# Patient Record
Sex: Female | Born: 1945
Health system: Southern US, Community
[De-identification: ages and names within clinical notes are randomized; demographics above are authoritative.]

## PROBLEM LIST (undated history)

## (undated) DIAGNOSIS — I10 Essential (primary) hypertension: Secondary | ICD-10-CM

## (undated) DIAGNOSIS — E785 Hyperlipidemia, unspecified: Secondary | ICD-10-CM

## (undated) DIAGNOSIS — R51 Headache: Secondary | ICD-10-CM

## (undated) DIAGNOSIS — E039 Hypothyroidism, unspecified: Secondary | ICD-10-CM

## (undated) HISTORY — DX: Hyperlipidemia, unspecified: E78.5

## (undated) HISTORY — PX: BREAST SURGERY: SHX581

## (undated) HISTORY — PX: BREAST EXCISIONAL BIOPSY: SUR124

---

## 1999-09-11 ENCOUNTER — Other Ambulatory Visit: Admission: RE | Admit: 1999-09-11 | Discharge: 1999-09-11 | Payer: Self-pay | Admitting: *Deleted

## 2001-03-28 ENCOUNTER — Other Ambulatory Visit: Admission: RE | Admit: 2001-03-28 | Discharge: 2001-03-28 | Payer: Self-pay | Admitting: *Deleted

## 2002-10-23 ENCOUNTER — Other Ambulatory Visit: Admission: RE | Admit: 2002-10-23 | Discharge: 2002-10-23 | Payer: Self-pay

## 2004-02-01 ENCOUNTER — Other Ambulatory Visit: Admission: RE | Admit: 2004-02-01 | Discharge: 2004-02-01 | Payer: Self-pay | Admitting: Family Medicine

## 2004-05-12 ENCOUNTER — Encounter: Admission: RE | Admit: 2004-05-12 | Discharge: 2004-08-10 | Payer: Self-pay | Admitting: Family Medicine

## 2004-09-10 ENCOUNTER — Ambulatory Visit (HOSPITAL_COMMUNITY): Admission: RE | Admit: 2004-09-10 | Discharge: 2004-09-10 | Payer: Self-pay | Admitting: Gastroenterology

## 2004-10-02 ENCOUNTER — Encounter: Admission: RE | Admit: 2004-10-02 | Discharge: 2004-12-31 | Payer: Self-pay | Admitting: Family Medicine

## 2005-03-24 ENCOUNTER — Other Ambulatory Visit: Admission: RE | Admit: 2005-03-24 | Discharge: 2005-03-24 | Payer: Self-pay | Admitting: Family Medicine

## 2006-04-07 ENCOUNTER — Other Ambulatory Visit: Admission: RE | Admit: 2006-04-07 | Discharge: 2006-04-07 | Payer: Self-pay | Admitting: Internal Medicine

## 2007-05-06 ENCOUNTER — Other Ambulatory Visit: Admission: RE | Admit: 2007-05-06 | Discharge: 2007-05-06 | Payer: Self-pay | Admitting: Internal Medicine

## 2009-06-25 ENCOUNTER — Other Ambulatory Visit: Admission: RE | Admit: 2009-06-25 | Discharge: 2009-06-25 | Payer: Self-pay | Admitting: Internal Medicine

## 2009-06-25 ENCOUNTER — Encounter: Payer: Self-pay | Admitting: Cardiovascular Disease

## 2009-07-01 DIAGNOSIS — I10 Essential (primary) hypertension: Secondary | ICD-10-CM | POA: Insufficient documentation

## 2009-07-02 ENCOUNTER — Ambulatory Visit: Payer: Self-pay | Admitting: Cardiovascular Disease

## 2009-07-23 ENCOUNTER — Encounter: Payer: Self-pay | Admitting: Cardiovascular Disease

## 2009-07-23 ENCOUNTER — Ambulatory Visit (HOSPITAL_COMMUNITY): Admission: RE | Admit: 2009-07-23 | Discharge: 2009-07-23 | Payer: Self-pay | Admitting: Cardiovascular Disease

## 2009-07-23 ENCOUNTER — Ambulatory Visit: Payer: Self-pay | Admitting: Cardiology

## 2009-07-23 ENCOUNTER — Ambulatory Visit: Payer: Self-pay

## 2009-07-30 ENCOUNTER — Telehealth: Payer: Self-pay | Admitting: Cardiovascular Disease

## 2009-09-18 ENCOUNTER — Encounter: Payer: Self-pay | Admitting: Cardiovascular Disease

## 2010-04-22 NOTE — Progress Notes (Signed)
Summary: pt wants test results  Phone Note Call from Patient Call back at Home Phone [phone]   Caller: Patient Reason for Call: Talk to Nurse, Talk to Doctor, Lab or Test Results Summary of Call: pt call to get echo results  Initial call taken by: Omer Jack,  Jul 30, 2009 4:32 PM  Follow-up for Phone Call        Spoke with pt. 2-D echo results given. Pt. verbalized understanding. Follow-up by: Ollen Gross, RN, BSN,  Jul 30, 2009 4:43 PM

## 2010-04-22 NOTE — Letter (Signed)
Summary: [facility]  [facility]   Imported By: Debby Freiberg 09/18/2009 08:46:53  _____________________________________________________________________  External Attachment:    Type:   Image     Comment:   External Document

## 2010-04-22 NOTE — Assessment & Plan Note (Signed)
Summary: np6/ ekg changed,dm, high blood / choles/ pt has bcbs. state....   Visit Type:  Initial Consult Primary Provider:  Marisue Brooklyn, MD/ Loree Fee PA  CC:  Referal from Dr.Stevenson.  History of Present Illness: 65 yo Elizabeth May with history of HTN, DM, hyperlipidemia  who is referred today as a new patient for further evaluation of an abnormal EKG.  Her EKG shows a RBBB with sinus rhythm and sinus arrhythmia, left axis deviation. She tells me that she has had no chest pain, SOB, palpitations, near syncope, syncope, dizziness, lower ext edema, orthopnea or PND. She is an active individual and works for [employer].   Current Medications (verified): 1)  Doxycycline Hyclate 100 Mg Caps (Doxycycline Hyclate) .Marland Kitchen.. 1 Tab By Mouth Two Times A Day 2)  Halcion 0.25 Mg Tabs (Triazolam) .Marland Kitchen.. 1 Tab Po At Bedtime As Needed 3)  Amlodipine Besylate 5 Mg Tabs (Amlodipine Besylate) .... Take One Tablet By Mouth Daily 4)  Lipitor 10 Mg Tabs (Atorvastatin Calcium) .... Take One Tablet By Mouth Daily. 5)  Synthroid 50 Mcg Tabs (Levothyroxine Sodium) .Marland Kitchen.. 1 Tab By Mouth Once Daily 6)  Lisinopril-Hydrochlorothiazide 20-25 Mg Tabs (Lisinopril-Hydrochlorothiazide) .Marland Kitchen.. 1 Tab By Mouth Once Daily 7)  Metformin Hcl 500 Mg Tabs (Metformin Hcl) .Marland Kitchen.. 1 Tab By Mouth Two Times A Day 8)  Clobetasol Propionate 0.05 % Crea (Clobetasol Propionate) .... As Directed 9)  Aspirin 81 Mg Tbec (Aspirin) .... Take One Tablet By Mouth Daily 10)  Allegra 180 Mg Tabs (Fexofenadine Hcl) .... As Needed 11)  Vitamin E 88416 Unit/ml Oil (Vitamin E) .Marland Kitchen.. 1 Tab By Mouth Once Daily 12)  Calcium 600 Mg Tabs (Calcium) .Marland Kitchen.. 1 Tab By Mouth Two Times A Day  Past History:  Past Medical History: HYPERLIPIDEMIA (ICD-272.4) HYPERTENSION (ICD-401.9) DIABETES MELLITUS, TYPE II (ICD-250.00) Hypothyroidism Seasonal allergies  Past Surgical History: Lumpectomy left breast 1981  Family History: Reviewed history and no changes  required. Mother-deceased, age 75, CHF Father-deceased, age 48, ulcer Brother-alive, healthy  Grandfathers died of MI, one was 78, other was 44  Social History: Reviewed history and no changes required. 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    Vital Signs:  Patient profile:   65 year old Elizabeth May Height:      64 inches Weight:      153 pounds BMI:     26.36 Pulse rate:   65 / minute Resp:     14 per minute BP sitting:   158 / 82  (left arm)  Vitals Entered By: Kem Parkinson (July 02, 2009 3:46 PM)  Physical Exam  General:  General: Well developed, well nourished, NAD HEENT: OP clear, mucus membranes moist SKIN: warm, dry Neuro: No focal deficits Musculoskeletal: Muscle strength 5/5 all ext Psychiatric: Mood and affect normal Neck: No JVD, no carotid bruits, no thyromegaly, no lymphadenopathy. Lungs:Clear bilaterally, no wheezes, rhonci, crackles CV: RRR no murmurs, gallops rubs Abdomen: soft, NT, ND, BS present Extremities: No edema, pulses 2+.\par  EKG  Procedure date:  06/24/2009  Findings:  RBBB with sinus rhythm and sinus arrhythmia, left axis deviation. Rate 62 bpm.   Impression & Recommendations:  Problem # 1:  ABNORMAL ELECTROCARDIOGRAM (ICD-794.31) RBBB on EKG. I have explained the conduction system of the heart in detail with the patient. I have explained that the RBBB is usually a  benign finding. I will arrange an echo to rule out structural heart disease. We will call her with the results of the echo. I will see her back if there are any abnormalities.   Her updated medication list for this problem includes:    Amlodipine Besylate 5 Mg Tabs (Amlodipine besylate) .Marland Kitchen... Take one tablet by mouth daily    Lisinopril-hydrochlorothiazide 20-25 Mg Tabs (Lisinopril-hydrochlorothiazide) .Marland Kitchen... 1 tab by mouth once daily    Aspirin 81 Mg Tbec (Aspirin) .Marland Kitchen... Take one tablet by mouth daily  Other Orders: Echocardiogram (Echo)  Patient Instructions: 1)  Your physician recommends that you schedule a follow-up appointment as needed  2)  Your physician has requested that you have an echocardiogram.  Echocardiography is a painless test that uses sound waves to create images of your heart. It provides your doctor with information about the size and shape of your heart and how well your heart's chambers and valves are working.  This procedure takes approximately one hour. There are no restrictions for this procedure.

## 2010-07-17 ENCOUNTER — Other Ambulatory Visit (HOSPITAL_COMMUNITY): Payer: Self-pay | Admitting: Internal Medicine

## 2010-07-17 ENCOUNTER — Ambulatory Visit (HOSPITAL_COMMUNITY)
Admission: RE | Admit: 2010-07-17 | Discharge: 2010-07-17 | Disposition: A | Discharge status: Home / Self Care | Payer: BC Managed Care – PPO | Source: Ambulatory Visit | Attending: Internal Medicine | Admitting: Internal Medicine

## 2010-07-17 DIAGNOSIS — I10 Essential (primary) hypertension: Secondary | ICD-10-CM

## 2010-07-17 DIAGNOSIS — Z0000 Encounter for general adult medical examination without abnormal findings: Secondary | ICD-10-CM | POA: Insufficient documentation

## 2010-08-08 NOTE — Op Note (Signed)
NAME:  Elizabeth May, Elizabeth May            ACCOUNT NO.:  [id number]   MEDICAL RECORD NO.:  [id number]          PATIENT TYPE:  AMB   LOCATION:  ENDO                         FACILITY:  [facility]   PHYSICIAN:  Anselmo Rod, M.D.  DATE OF BIRTH:  [birth year]   DATE OF PROCEDURE:  09/10/2004  DATE OF DISCHARGE:                                 OPERATIVE REPORT   PROCEDURE:  Screening colonoscopy.   ENDOSCOPIST:  Anselmo Rod, M.D.   INSTRUMENT USED:  Olympus video colonoscope.   INDICATIONS FOR PROCEDURE:  A 65 year old white female undergoing screening  colonoscopy to rule out chronic polyps, masses, etc.   PREPROCEDURE PREPARATION:  Informed consent was procured from the patient.  The patient was fasted for 8 hours prior to the procedure and prepped with a  bottle of magnesium citrate and a gallon of GoLYTELY the night prior to the  procedure.  The risks and benefits of the procedure including a 10% miss  rate of cancer and polyps was discussed with the patient as well.   PREPROCEDURE PHYSICAL EXAMINATION:  VITAL SIGNS:  Stable.  NECK:  Supple.  CHEST:  Clear to auscultation.  S1 and S2 regular.  ABDOMEN:  Soft with normal bowel sounds.   DESCRIPTION OF PROCEDURE:  The patient was placed in the left lateral  decubitus position and sedated with 70 mg of Demerol and 7.5 mg of Versed in  slow incremental doses.  Once the patient was adequately sedated and  maintained on low flow oxygen and continuous cardiac monitoring, the Olympus  video colonoscope was advanced from the rectum to the cecum.  The  appendiceal orifice and ileocecal valve were clearly visualized and  photographed.  No masses, polyps, erosions, ulcerations, or diverticula were  seen.  Small internal and external hemorrhoids were seen on retroflexion and  anal inspection.  The rest of the examination up to the terminal ileum was  unremarkable.   IMPRESSION:  Normal colonoscopy up to the terminal ileum except for small  internal and external hemorrhoids.  No masses, polyps, or diverticula seen.   RECOMMENDATIONS:  1.  Continue a high fiber diet with liberal fluid intake.  2.  Repeat colonoscopy in the next 10 years unless the patient develops any      abnormal symptoms in the interim.  3.  Outpatient follow-up as need arises in the future.       JNM/MEDQ  D:  09/10/2004  T:  09/10/2004  Job:  [id number]   cc:   Talmadge Coventry, M.D.  [address]  [place]  [place] [address]  Fax: [phone]   Severiano Gilbert Dike, PA/Dr. Smith Mince

## 2011-01-21 LAB — HM MAMMOGRAPHY: HM Mammogram: NORMAL

## 2011-08-11 ENCOUNTER — Ambulatory Visit (INDEPENDENT_AMBULATORY_CARE_PROVIDER_SITE_OTHER): Payer: BC Managed Care – PPO | Admitting: Family Medicine

## 2011-08-11 ENCOUNTER — Ambulatory Visit: Payer: BC Managed Care – PPO

## 2011-08-11 VITALS — BP 146/80 | HR 61 | Temp 98.3°F | Resp 16 | Ht 63.5 in | Wt 152.6 lb

## 2011-08-11 DIAGNOSIS — M5412 Radiculopathy, cervical region: Secondary | ICD-10-CM

## 2011-08-11 MED ORDER — CYCLOBENZAPRINE HCL 10 MG PO TABS: ORAL_TABLET | ORAL | Status: DC

## 2011-08-11 MED ORDER — PREDNISONE 20 MG PO TABS: 40.0000 mg | ORAL_TABLET | Freq: Every day | ORAL | Status: AC

## 2011-08-11 NOTE — Progress Notes (Signed)
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 She works in the morning feeling the pain and it is persisted. The pain is worse when she looks to the right and the pain radiates down her right trapezius, right arm, and into the right forearm but not extending to the wrist or hand. She has no loss of feeling or motor strength in the right side.  She's never had this problem before. She denies cough, fever, swelling discomfort.  Objective: Adult woman in mild distress trying to move her head in a comfortable way.  Neck range of motion: Appears normal  Neck palpation: Mildly tender in the trapezius region on the right.  Inspection of neck and shoulder as well as arm are all normal.  Range of motion of shoulder: Normal although she does experience pain when she Her arm over her shoulder height.  Reflexes: Normal bilaterally biceps and triceps.   Skin: No rash  [facility] reading (PRIMARY) by  Dr. Milus Glazier  C/spine-normal  Assessment: . Acute torticollis without evidence of significant neurological injury.  Plan: 2 days of prednisone and Flexeril at night.  Return if symptoms persist

## 2011-08-11 NOTE — Patient Instructions (Signed)
Torticollis, Acute You have suddenly (acutely) developed a twisted neck (torticollis). This is usually a self-limited condition. CAUSES  Acute torticollis may be caused by malposition, trauma or infection. Most commonly, acute torticollis is caused by sleeping in an awkward position. Torticollis may also be caused by the flexion, extension or twisting of the neck muscles beyond their normal position. Sometimes, the exact cause may not be known. SYMPTOMS  Usually, there is pain and limited movement of the neck. Your neck may twist to one side. DIAGNOSIS  The diagnosis is often made by physical examination. X-rays, CT scans or MRIs may be done if there is a history of trauma or concern of infection. TREATMENT  For a common, stiff neck that develops during sleep, treatment is focused on relaxing the contracted neck muscle. Medications (including shots) may be used to treat the problem. Most cases resolve in several days. Torticollis usually responds to conservative physical therapy. If left untreated, the shortened and spastic neck muscle can cause deformities in the face and neck. Rarely, surgery is required. HOME CARE INSTRUCTIONS   Use over-the-counter and prescription medications as directed by your caregiver.   Do stretching exercises and massage the neck as directed by your caregiver.   Follow up with physical therapy if needed and as directed by your caregiver.  SEEK IMMEDIATE MEDICAL CARE IF:   You develop difficulty breathing or noisy breathing (stridor).   You drool, develop trouble swallowing or have pain with swallowing.   You develop numbness or weakness in the hands or feet.   You have changes in speech or vision.   You have problems with urination or bowel movements.   You have difficulty walking.   You have a fever.   You have increased pain.  MAKE SURE YOU:   Understand these instructions.   Will watch your condition.   Will get help right away if you are not  doing well or get worse.  Document Released: 03/06/2000 Document Revised: 02/26/2011 Document Reviewed: 04/17/2009 Exit[employer] Patient Information 2012 [employer]. 

## 2011-08-14 ENCOUNTER — Other Ambulatory Visit: Payer: Self-pay | Admitting: Family Medicine

## 2011-08-15 ENCOUNTER — Ambulatory Visit (INDEPENDENT_AMBULATORY_CARE_PROVIDER_SITE_OTHER): Payer: BC Managed Care – PPO | Admitting: Family Medicine

## 2011-08-15 VITALS — BP 127/78 | HR 82 | Temp 98.1°F | Resp 16 | Ht 64.0 in | Wt 151.8 lb

## 2011-08-15 DIAGNOSIS — S139XXA Sprain of joints and ligaments of unspecified parts of neck, initial encounter: Secondary | ICD-10-CM

## 2011-08-15 DIAGNOSIS — M542 Cervicalgia: Secondary | ICD-10-CM

## 2011-08-15 DIAGNOSIS — S161XXA Strain of muscle, fascia and tendon at neck level, initial encounter: Secondary | ICD-10-CM

## 2011-08-15 MED ORDER — TRAMADOL HCL 50 MG PO TABS: 50.0000 mg | ORAL_TABLET | Freq: Three times a day (TID) | ORAL | Status: DC | PRN

## 2011-08-15 MED ORDER — PREDNISONE 20 MG PO TABS: ORAL_TABLET | ORAL | Status: AC

## 2011-08-15 NOTE — Progress Notes (Signed)
Patient Name: Elizabeth May Date of Birth: [birth year] Medical Record Number: [id number] Gender: female Date of Encounter: 08/15/2011  History of Present Illness:  Elizabeth May is a 66 y.o. very pleasant female patient who presents with the following:  Here to recheck her neck pain.  She was here on the 21st with right sided neck pain that she noted upon awakening on Sunday-  about a week ago.  She seems to make it worse when she reached to pick up a basket while at work on Monday.  The pain is persistent now- here on 5/21 and was treated with just a couple of days of prednisone.  This did seem to help, but she was not given a longer course due to DM.    She has her A1c followed by her PCP, and notes that her glucose has been well controlled in general.   She did not note any large elevation of her glucose when she was on prednisone.  She is using flexeril but still has some pain  Patient Active Problem List  Diagnoses  . DIABETES MELLITUS, TYPE II  . HYPERLIPIDEMIA  . HYPERTENSION  . ABNORMAL ELECTROCARDIOGRAM   No past medical history on file. No past surgical history on file. History  Substance Use Topics  . Smoking status: Never Smoker   . Smokeless tobacco: Not on file  . Alcohol Use: Not on file   No family history on file. No Known Allergies  Medication list has been reviewed and updated.  Review of Systems: As per HPI- otherwise negative. No symptoms of illness such as fever, malaise  Physical Examination: Filed Vitals:   08/15/11 1002  BP: 127/78  Pulse: 82  Temp: 98.1 F (36.7 C)  TempSrc: Oral  Resp: 16  Height: 5\' 4"  (1.626 m)  Weight: 151 lb 12.8 oz (68.856 kg)  SpO2: 99%    Body mass index is 26.06 kg/(m^2).  GEN: WDWN, NAD, Non-toxic, A & O x 3 HEENT: Atraumatic, Normocephalic. No masses, No LAD. Tm, oropharynx wnl, PEERL Ears and Nose: No external deformity. CV: RRR, No M/G/R. No JVD. No thrill. No extra heart sounds. PULM: CTA B, no  wheezes, crackles, rhonchi. No retractions. No resp. distress. No accessory muscle use EXTR: No c/c/e NEURO Normal gait.  PSYCH: Normally interactive. Conversant. Not depressed or anxious appearing.  Calm demeanor.  Neck: there is tenderness and spasm of the right paraspinous muscles, and she has restricted ROM with extension and rotation especially to the right.  Flexion is preserved.  Normal strength and sensation, biceps DTR in both UE, but she does have a pain/ tingling that can run into her right trapeziums muscle and into her arm  CERVICAL SPINE - 2-3 VIEW  Comparison: None.  Findings: The lateral masses are well-aligned. The AP and lateral  cervical alignment are within normal limits. The prevertebral soft  tissue stripe is normal. The vertebral body heights and disc  spaces are maintained. There is uncinate process hypertrophy at C4-  C5, C5-C6, C6-C7.  IMPRESSION: There is multilevel uncinate process hypertrophy, as  above. Foraminal narrowing at these levels could be better seen  with the oblique views or MRI.  Assessment and Plan: 1. Neck pain  traMADol (ULTRAM) 50 MG tablet  2. Neck strain  predniSONE (DELTASONE) 20 MG tablet   Persistent neck pain and strain- suspect that parasthesias are due to nerve compression in her spine or by a muscle spasm. Will try a slightly longer course of prednisone, get  an OTC soft collar, and may use ultram as above.  If not better in a few days please call- Sooner if worse.

## 2011-08-21 ENCOUNTER — Other Ambulatory Visit: Payer: Self-pay | Admitting: Family Medicine

## 2011-08-27 ENCOUNTER — Ambulatory Visit (INDEPENDENT_AMBULATORY_CARE_PROVIDER_SITE_OTHER): Payer: BC Managed Care – PPO | Admitting: Emergency Medicine

## 2011-08-27 VITALS — BP 107/67 | HR 66 | Temp 97.7°F | Resp 16 | Ht 63.5 in | Wt 151.0 lb

## 2011-08-27 DIAGNOSIS — M542 Cervicalgia: Secondary | ICD-10-CM

## 2011-08-27 DIAGNOSIS — IMO0002 Reserved for concepts with insufficient information to code with codable children: Secondary | ICD-10-CM

## 2011-08-27 DIAGNOSIS — M792 Neuralgia and neuritis, unspecified: Secondary | ICD-10-CM

## 2011-08-27 MED ORDER — CYCLOBENZAPRINE HCL 10 MG PO TABS: ORAL_TABLET | ORAL | Status: DC

## 2011-08-27 MED ORDER — TRAMADOL HCL 50 MG PO TABS: 50.0000 mg | ORAL_TABLET | Freq: Four times a day (QID) | ORAL | Status: DC | PRN

## 2011-08-27 MED ORDER — PREDNISONE 10 MG PO TABS: ORAL_TABLET | ORAL | Status: DC

## 2011-08-27 NOTE — Progress Notes (Signed)
  Subjective:    Patient ID: Elizabeth May, female    DOB: 07-Nov-1945, 66 y.o.   MRN: [id number]  HPI patient enters with persistent pain he in her neck down the right arm. She has been seen here previously and treated with prednisone. She is better after the prednisone but since she returned to work she has developed recurrent pain in her neck and down the right arm.    Review of Systems plain films of her neck showed significant multilevel disease     Objective:   Physical Exam  Constitutional: She appears well-developed and well-nourished.  Eyes: Pupils are equal, round, and reactive to light.  Neck: No tracheal deviation present. No thyromegaly present.  Cardiovascular: Normal rate and regular rhythm.  Exam reveals no gallop and no friction rub.   No murmur heard. Pulmonary/Chest: Effort normal. No respiratory distress. She has no wheezes. She has no rales.  Neurological:       There is tenderness on the right side of neck. Deep tendon reflexes are diminished since the right biceps and right brachial radialis. There is pain with extension of the neck.          Assessment & Plan:  Patient has a significant right arm radiculopathy related to cervical disc disease. She is diabetic but is under control we'll treat with a 6 day taper of prednisone as well as Flexeril at night and tramadol for pain. If she continues to have problems will need an MRI of the C-spine on her return

## 2011-09-02 ENCOUNTER — Other Ambulatory Visit: Payer: Self-pay | Admitting: Emergency Medicine

## 2011-09-23 ENCOUNTER — Telehealth: Payer: Self-pay

## 2011-09-23 NOTE — Telephone Encounter (Signed)
Printed out request and gave it to xray =) JF

## 2011-09-23 NOTE — Telephone Encounter (Signed)
Pt is requesting cd for x-ray.  Most recent x-ray done. [phone]

## 2011-09-23 NOTE — Telephone Encounter (Signed)
Copied xray (C-spine) on CD and placed up front for pick up; patient notified

## 2011-10-12 ENCOUNTER — Other Ambulatory Visit: Payer: Self-pay | Admitting: Orthopedic Surgery

## 2011-10-13 ENCOUNTER — Encounter (HOSPITAL_COMMUNITY): Payer: Self-pay | Admitting: Pharmacy Technician

## 2011-10-15 NOTE — Pre-Procedure Instructions (Addendum)
20 Elizabeth May  10/15/2011   Your procedure is scheduled on:  October 22, 2011  Report to [facility] at 5:30 AM.  Call this number if you have problems the morning of surgery: [phone]   Remember:   Do not eat food or drink:After Midnight.  Take these medicines the morning of surgery with A SIP OF WATER: norvasc,, synthroid, ultram, flexeril   Do not wear jewelry, make-up or nail polish.  Do not wear lotions, powders, or perfumes. You may wear deodorant.  Do not shave 48 hours prior to surgery. Men may shave face and neck.  Do not bring valuables to the hospital.  Contacts, dentures or bridgework may not be worn into surgery.  Leave suitcase in the car. After surgery it may be brought to your room.  For patients admitted to the hospital, checkout time is 11:00 AM the day of discharge.   Patients discharged the day of surgery will not be allowed to drive home.  Name and phone number of your driver:*spouse [name] [phone]  Special Instructions: Incentive Spirometry - Practice and bring it with you on the day of surgery. and CHG Shower Use Special Wash: 1/2 bottle night before surgery and 1/2 bottle morning of surgery.   Please read over the following fact sheets that you were given: Pain Booklet, Coughing and Deep Breathing, Blood Transfusion Information and Surgical Site Infection Prevention

## 2011-10-15 NOTE — Consult Note (Signed)
Anesthesia Chart Review:  Patient is a 66 year old female scheduled for a one level ACDF on 10/22/11.  Her PAT visit is not until 10/16/11, but Dr. Marshell Levan office faxed me over records to review.  She receives primary care at [facility].  She was last seen by Loree Fee, PA-C on 09/15/11.  According to PCP records, history includes HTN, hypercholesterolemia, DM2, hypothyroidism, left lumpectomy '81.    She was evaluated by Cardiologist Dr. Clifton James in April 2011 for an abnormal EKG showing a right BBB, sinus arrhythmia, and LAD.  An echocardiogram was recommended (see below).  No specific follow-up was recommended based on echo results.  Echo on 07/23/09 showed: - Left ventricle: The cavity size was normal. Wall thickness was normal. Systolic function was normal. The estimated ejection fraction was in the range of 55% to 60%. Wall motion was normal; there were no regional wall motion abnormalities. Doppler parameters are consistent with abnormal left ventricular relaxation (grade 1 diastolic dysfunction). - Aortic valve: There was no stenosis. - Mitral valve: Trivial regurgitation. - Left atrium: The atrium was mildly dilated. - Tricuspid valve: Trivial regurgitation. - Pulmonic valve: Trivial regurgitation. - Right ventricle: The cavity size was normal. Systolic function was normal. - Pulmonary arteries: No complete TR doppler jet so unable to estimate PA systolic pressure. - Systemic veins: IVC measured 2.2 cm with normal respirophasic variation, suggesting RA pressure 6-10 mmHg.  EKG on 09/15/11 (PCP) showed SR, right BBB, LAD.  I think it appears stable when compared to her EKG from 06/24/09.  Her labs and CXR are still pending.  Her PAT RN can have me review these if they are abnormal.    Shonna Chock, PA-C 10/15/11 1227

## 2011-10-16 ENCOUNTER — Encounter (HOSPITAL_COMMUNITY)
Admission: RE | Admit: 2011-10-16 | Discharge: 2011-10-16 | Disposition: A | Discharge status: Home / Self Care | Payer: BC Managed Care – PPO | Source: Ambulatory Visit | Attending: Orthopedic Surgery | Admitting: Orthopedic Surgery

## 2011-10-16 ENCOUNTER — Ambulatory Visit (HOSPITAL_COMMUNITY)
Admission: RE | Admit: 2011-10-16 | Discharge: 2011-10-16 | Disposition: A | Discharge status: Home / Self Care | Payer: BC Managed Care – PPO | Source: Ambulatory Visit | Attending: Orthopedic Surgery | Admitting: Orthopedic Surgery

## 2011-10-16 ENCOUNTER — Encounter (HOSPITAL_COMMUNITY): Payer: Self-pay

## 2011-10-16 DIAGNOSIS — Z01812 Encounter for preprocedural laboratory examination: Secondary | ICD-10-CM | POA: Insufficient documentation

## 2011-10-16 DIAGNOSIS — I10 Essential (primary) hypertension: Secondary | ICD-10-CM | POA: Insufficient documentation

## 2011-10-16 DIAGNOSIS — E119 Type 2 diabetes mellitus without complications: Secondary | ICD-10-CM | POA: Insufficient documentation

## 2011-10-16 DIAGNOSIS — Z01818 Encounter for other preprocedural examination: Secondary | ICD-10-CM | POA: Insufficient documentation

## 2011-10-16 HISTORY — DX: Headache: R51

## 2011-10-16 HISTORY — DX: Essential (primary) hypertension: I10

## 2011-10-16 HISTORY — DX: Hypothyroidism, unspecified: E03.9

## 2011-10-16 LAB — CBC WITH DIFFERENTIAL/PLATELET
Basophils Relative: 1 % (ref 0–1)
Eosinophils Absolute: 0.1 10*3/uL (ref 0.0–0.7)
MCH: 29.4 pg (ref 26.0–34.0)
MCHC: 33.7 g/dL (ref 30.0–36.0)
Monocytes Relative: 6 % (ref 3–12)
Neutrophils Relative %: 61 % (ref 43–77)
Platelets: 385 10*3/uL (ref 150–400)

## 2011-10-16 LAB — URINALYSIS, ROUTINE W REFLEX MICROSCOPIC
Hgb urine dipstick: NEGATIVE
Leukocytes, UA: NEGATIVE
Nitrite: NEGATIVE
Specific Gravity, Urine: 1.022 (ref 1.005–1.030)
Urobilinogen, UA: 0.2 mg/dL (ref 0.0–1.0)

## 2011-10-16 LAB — COMPREHENSIVE METABOLIC PANEL
Albumin: 4.1 g/dL (ref 3.5–5.2)
Alkaline Phosphatase: 57 U/L (ref 39–117)
BUN: 21 mg/dL (ref 6–23)
Potassium: 4.1 mEq/L (ref 3.5–5.1)
Sodium: 140 mEq/L (ref 135–145)
Total Protein: 7 g/dL (ref 6.0–8.3)

## 2011-10-16 LAB — SURGICAL PCR SCREEN: Staphylococcus aureus: NEGATIVE

## 2011-10-16 LAB — PROTIME-INR: Prothrombin Time: 12.5 seconds (ref 11.6–15.2)

## 2011-10-16 LAB — TYPE AND SCREEN
ABO/RH(D): O POS
Antibody Screen: NEGATIVE

## 2011-10-21 MED ORDER — CLINDAMYCIN PHOSPHATE 900 MG/50ML IV SOLN
900.0000 mg | INTRAVENOUS | Status: AC
  Administered 2011-10-22: 900 mg via INTRAVENOUS
  Filled 2011-10-21: qty 50

## 2011-10-21 MED ORDER — POVIDONE-IODINE 7.5 % EX SOLN
Freq: Once | CUTANEOUS | Status: DC
  Filled 2011-10-21: qty 118

## 2011-10-22 ENCOUNTER — Encounter (HOSPITAL_COMMUNITY)
Admission: RE | Disposition: A | Discharge status: Home / Self Care | Payer: Self-pay | Source: Ambulatory Visit | Attending: Orthopedic Surgery

## 2011-10-22 ENCOUNTER — Ambulatory Visit (HOSPITAL_COMMUNITY): Payer: BC Managed Care – PPO

## 2011-10-22 ENCOUNTER — Observation Stay (HOSPITAL_COMMUNITY)
Admission: RE | Admit: 2011-10-22 | Discharge: 2011-10-23 | DRG: 865 | Disposition: A | Discharge status: Home / Self Care | Payer: BC Managed Care – PPO | Source: Ambulatory Visit | Attending: Orthopedic Surgery | Admitting: Orthopedic Surgery

## 2011-10-22 ENCOUNTER — Encounter (HOSPITAL_COMMUNITY): Payer: Self-pay | Admitting: *Deleted

## 2011-10-22 ENCOUNTER — Encounter (HOSPITAL_COMMUNITY): Payer: Self-pay | Admitting: Vascular Surgery

## 2011-10-22 ENCOUNTER — Ambulatory Visit (HOSPITAL_COMMUNITY): Payer: BC Managed Care – PPO | Admitting: Vascular Surgery

## 2011-10-22 DIAGNOSIS — M5020 Other cervical disc displacement, unspecified cervical region: Principal | ICD-10-CM | POA: Insufficient documentation

## 2011-10-22 DIAGNOSIS — E119 Type 2 diabetes mellitus without complications: Secondary | ICD-10-CM | POA: Insufficient documentation

## 2011-10-22 DIAGNOSIS — I10 Essential (primary) hypertension: Secondary | ICD-10-CM | POA: Insufficient documentation

## 2011-10-22 DIAGNOSIS — M5412 Radiculopathy, cervical region: Secondary | ICD-10-CM | POA: Diagnosis present

## 2011-10-22 HISTORY — PX: ANTERIOR CERVICAL DECOMP/DISCECTOMY FUSION: SHX1161

## 2011-10-22 LAB — GLUCOSE, CAPILLARY
Glucose-Capillary: 115 mg/dL — ABNORMAL HIGH (ref 70–99)
Glucose-Capillary: 173 mg/dL — ABNORMAL HIGH (ref 70–99)
Glucose-Capillary: 150 mg/dL — ABNORMAL HIGH (ref 70–99)
Glucose-Capillary: 129 mg/dL — ABNORMAL HIGH (ref 70–99)

## 2011-10-22 SURGERY — ANTERIOR CERVICAL DECOMPRESSION/DISCECTOMY FUSION 1 LEVEL
Anesthesia: General | Site: Neck | Laterality: Right | Wound class: Clean

## 2011-10-22 MED ORDER — CALCIUM-VITAMIN D 500-200 MG-UNIT PO TABS: 1.0000 | ORAL_TABLET | Freq: Two times a day (BID) | ORAL | Status: DC

## 2011-10-22 MED ORDER — METOCLOPRAMIDE HCL 5 MG/5ML PO SOLN
10.0000 mg | Freq: Four times a day (QID) | ORAL | Status: DC | PRN
  Filled 2011-10-22: qty 10

## 2011-10-22 MED ORDER — FENTANYL CITRATE 0.05 MG/ML IJ SOLN
INTRAMUSCULAR | Status: DC | PRN
  Administered 2011-10-22: 100 ug via INTRAVENOUS
  Administered 2011-10-22: 50 ug via INTRAVENOUS
  Administered 2011-10-22 (×2): 25 ug via INTRAVENOUS

## 2011-10-22 MED ORDER — ONDANSETRON HCL 4 MG/2ML IJ SOLN: 4.0000 mg | Freq: Once | INTRAMUSCULAR | Status: DC | PRN

## 2011-10-22 MED ORDER — NEOSTIGMINE METHYLSULFATE 1 MG/ML IJ SOLN
INTRAMUSCULAR | Status: DC | PRN
  Administered 2011-10-22: 3 mg via INTRAVENOUS

## 2011-10-22 MED ORDER — CEFAZOLIN SODIUM 1-5 GM-% IV SOLN
1.0000 g | Freq: Three times a day (TID) | INTRAVENOUS | Status: AC
  Administered 2011-10-22 – 2011-10-23 (×2): 1 g via INTRAVENOUS
  Filled 2011-10-22 (×2): qty 50

## 2011-10-22 MED ORDER — ONDANSETRON HCL 4 MG/2ML IJ SOLN
INTRAMUSCULAR | Status: DC | PRN
  Administered 2011-10-22: 4 mg via INTRAVENOUS

## 2011-10-22 MED ORDER — CALCIUM CARBONATE-VITAMIN D 500-200 MG-UNIT PO TABS
1.0000 | ORAL_TABLET | Freq: Two times a day (BID) | ORAL | Status: DC
  Administered 2011-10-23: 1 via ORAL
  Filled 2011-10-22 (×4): qty 1

## 2011-10-22 MED ORDER — VECURONIUM BROMIDE 10 MG IV SOLR
INTRAVENOUS | Status: DC | PRN
  Administered 2011-10-22: 7 mg via INTRAVENOUS
  Administered 2011-10-22: 1 mg via INTRAVENOUS

## 2011-10-22 MED ORDER — HYDROCHLOROTHIAZIDE 25 MG PO TABS
25.0000 mg | ORAL_TABLET | Freq: Every day | ORAL | Status: DC
  Administered 2011-10-23: 25 mg via ORAL
  Filled 2011-10-22 (×2): qty 1

## 2011-10-22 MED ORDER — DIAZEPAM 5 MG PO TABS
5.0000 mg | ORAL_TABLET | Freq: Four times a day (QID) | ORAL | Status: DC | PRN
  Administered 2011-10-22: 5 mg via ORAL
  Filled 2011-10-22: qty 1

## 2011-10-22 MED ORDER — OXYCODONE-ACETAMINOPHEN 5-325 MG PO TABS
1.0000 | ORAL_TABLET | ORAL | Status: DC | PRN
  Administered 2011-10-22 – 2011-10-23 (×2): 2 via ORAL
  Administered 2011-10-23: 1 via ORAL
  Filled 2011-10-22: qty 1
  Filled 2011-10-22 (×3): qty 2

## 2011-10-22 MED ORDER — GLYCOPYRROLATE 0.2 MG/ML IJ SOLN
INTRAMUSCULAR | Status: DC | PRN
  Administered 2011-10-22: 0.6 mg via INTRAVENOUS

## 2011-10-22 MED ORDER — ACETAMINOPHEN 650 MG RE SUPP: 650.0000 mg | RECTAL | Status: DC | PRN

## 2011-10-22 MED ORDER — LISINOPRIL 20 MG PO TABS
20.0000 mg | ORAL_TABLET | Freq: Every day | ORAL | Status: DC
  Administered 2011-10-23: 20 mg via ORAL
  Filled 2011-10-22 (×2): qty 1

## 2011-10-22 MED ORDER — MENTHOL 3 MG MT LOZG: 1.0000 | LOZENGE | OROMUCOSAL | Status: DC | PRN

## 2011-10-22 MED ORDER — LEVOTHYROXINE SODIUM 50 MCG PO TABS
50.0000 ug | ORAL_TABLET | Freq: Every day | ORAL | Status: DC
  Administered 2011-10-23: 50 ug via ORAL
  Filled 2011-10-22 (×2): qty 1

## 2011-10-22 MED ORDER — POTASSIUM CHLORIDE IN NACL 20-0.9 MEQ/L-% IV SOLN
INTRAVENOUS | Status: DC
  Administered 2011-10-23: 06:00:00 via INTRAVENOUS
  Filled 2011-10-22 (×3): qty 1000

## 2011-10-22 MED ORDER — SODIUM CHLORIDE 0.9 % IJ SOLN: 3.0000 mL | Freq: Two times a day (BID) | INTRAMUSCULAR | Status: DC

## 2011-10-22 MED ORDER — MORPHINE SULFATE 2 MG/ML IJ SOLN
2.0000 mg | INTRAMUSCULAR | Status: DC | PRN
  Administered 2011-10-22: 2 mg via INTRAVENOUS
  Filled 2011-10-22 (×3): qty 1

## 2011-10-22 MED ORDER — HYDROMORPHONE HCL PF 1 MG/ML IJ SOLN
0.2500 mg | INTRAMUSCULAR | Status: DC | PRN
  Administered 2011-10-22 (×4): 0.5 mg via INTRAVENOUS

## 2011-10-22 MED ORDER — PHENYLEPHRINE HCL 10 MG/ML IJ SOLN
INTRAMUSCULAR | Status: DC | PRN
  Administered 2011-10-22: 160 ug via INTRAVENOUS
  Administered 2011-10-22: 120 ug via INTRAVENOUS
  Administered 2011-10-22 (×2): 80 ug via INTRAVENOUS

## 2011-10-22 MED ORDER — THROMBIN 20000 UNITS EX KIT
PACK | CUTANEOUS | Status: DC | PRN
  Administered 2011-10-22: 09:00:00 via TOPICAL

## 2011-10-22 MED ORDER — BUPIVACAINE-EPINEPHRINE PF 0.25-1:200000 % IJ SOLN
INTRAMUSCULAR | Status: AC
  Filled 2011-10-22: qty 30

## 2011-10-22 MED ORDER — ONDANSETRON HCL 4 MG/2ML IJ SOLN
4.0000 mg | INTRAMUSCULAR | Status: DC | PRN
  Administered 2011-10-22 (×2): 4 mg via INTRAVENOUS
  Filled 2011-10-22: qty 2

## 2011-10-22 MED ORDER — ATORVASTATIN CALCIUM 10 MG PO TABS
10.0000 mg | ORAL_TABLET | ORAL | Status: DC
  Filled 2011-10-22: qty 1

## 2011-10-22 MED ORDER — LIDOCAINE HCL (CARDIAC) 20 MG/ML IV SOLN
INTRAVENOUS | Status: DC | PRN
  Administered 2011-10-22: 100 mg via INTRAVENOUS

## 2011-10-22 MED ORDER — LISINOPRIL-HYDROCHLOROTHIAZIDE 20-25 MG PO TABS: 1.0000 | ORAL_TABLET | Freq: Every day | ORAL | Status: DC

## 2011-10-22 MED ORDER — PROPOFOL 10 MG/ML IV EMUL
INTRAVENOUS | Status: DC | PRN
  Administered 2011-10-22: 100 mg via INTRAVENOUS

## 2011-10-22 MED ORDER — LACTATED RINGERS IV SOLN
INTRAVENOUS | Status: DC | PRN
  Administered 2011-10-22: 07:00:00 via INTRAVENOUS

## 2011-10-22 MED ORDER — EPHEDRINE SULFATE 50 MG/ML IJ SOLN
INTRAMUSCULAR | Status: DC | PRN
  Administered 2011-10-22: 10 mg via INTRAVENOUS
  Administered 2011-10-22: 25 mg via INTRAVENOUS

## 2011-10-22 MED ORDER — AMLODIPINE BESYLATE 5 MG PO TABS
5.0000 mg | ORAL_TABLET | Freq: Every day | ORAL | Status: DC
  Administered 2011-10-23: 5 mg via ORAL
  Filled 2011-10-22: qty 1

## 2011-10-22 MED ORDER — ACETAMINOPHEN 325 MG PO TABS: 650.0000 mg | ORAL_TABLET | ORAL | Status: DC | PRN

## 2011-10-22 MED ORDER — ZOLPIDEM TARTRATE 5 MG PO TABS
5.0000 mg | ORAL_TABLET | Freq: Every evening | ORAL | Status: DC | PRN
  Administered 2011-10-22: 5 mg via ORAL
  Filled 2011-10-22: qty 1

## 2011-10-22 MED ORDER — THROMBIN 20000 UNITS EX SOLR
CUTANEOUS | Status: AC
  Filled 2011-10-22: qty 20000

## 2011-10-22 MED ORDER — WHITE PETROLATUM GEL
Status: AC
  Filled 2011-10-22: qty 5

## 2011-10-22 MED ORDER — SODIUM CHLORIDE 0.9 % IV SOLN: 250.0000 mL | INTRAVENOUS | Status: DC

## 2011-10-22 MED ORDER — METFORMIN HCL 500 MG PO TABS
1000.0000 mg | ORAL_TABLET | Freq: Two times a day (BID) | ORAL | Status: DC
  Administered 2011-10-23: 1000 mg via ORAL
  Filled 2011-10-22 (×4): qty 2

## 2011-10-22 MED ORDER — PHENOL 1.4 % MT LIQD: 1.0000 | OROMUCOSAL | Status: DC | PRN

## 2011-10-22 MED ORDER — DOCUSATE SODIUM 100 MG PO CAPS
100.0000 mg | ORAL_CAPSULE | Freq: Two times a day (BID) | ORAL | Status: DC
  Administered 2011-10-22 – 2011-10-23 (×2): 100 mg via ORAL
  Filled 2011-10-22 (×3): qty 1

## 2011-10-22 MED ORDER — MIDAZOLAM HCL 5 MG/5ML IJ SOLN
INTRAMUSCULAR | Status: DC | PRN
  Administered 2011-10-22: 1 mg via INTRAVENOUS

## 2011-10-22 MED ORDER — SODIUM CHLORIDE 0.9 % IJ SOLN: 3.0000 mL | INTRAMUSCULAR | Status: DC | PRN

## 2011-10-22 MED ORDER — METOCLOPRAMIDE HCL 5 MG/ML IJ SOLN
10.0000 mg | Freq: Four times a day (QID) | INTRAMUSCULAR | Status: DC | PRN
  Administered 2011-10-22: 10 mg via INTRAVENOUS

## 2011-10-22 MED ORDER — ACETAMINOPHEN 10 MG/ML IV SOLN: 1000.0000 mg | Freq: Once | INTRAVENOUS | Status: DC | PRN

## 2011-10-22 MED ORDER — HETASTARCH-ELECTROLYTES 6 % IV SOLN
INTRAVENOUS | Status: DC | PRN
  Administered 2011-10-22: 08:00:00 via INTRAVENOUS

## 2011-10-22 MED ORDER — ALUM & MAG HYDROXIDE-SIMETH 200-200-20 MG/5ML PO SUSP: 30.0000 mL | Freq: Four times a day (QID) | ORAL | Status: DC | PRN

## 2011-10-22 MED ORDER — HYDROMORPHONE HCL PF 1 MG/ML IJ SOLN
INTRAMUSCULAR | Status: AC
  Filled 2011-10-22: qty 1

## 2011-10-22 MED ORDER — BUPIVACAINE-EPINEPHRINE 0.25% -1:200000 IJ SOLN
INTRAMUSCULAR | Status: DC | PRN
  Administered 2011-10-22: 2.5 mL

## 2011-10-22 MED ORDER — ACETAMINOPHEN 10 MG/ML IV SOLN
INTRAVENOUS | Status: AC
  Filled 2011-10-22: qty 100

## 2011-10-22 MED ORDER — SENNA 8.6 MG PO TABS
1.0000 | ORAL_TABLET | Freq: Two times a day (BID) | ORAL | Status: DC
  Administered 2011-10-22 – 2011-10-23 (×2): 8.6 mg via ORAL
  Filled 2011-10-22 (×4): qty 1

## 2011-10-22 MED ORDER — ACETAMINOPHEN 10 MG/ML IV SOLN
INTRAVENOUS | Status: DC | PRN
  Administered 2011-10-22: 1000 mg via INTRAVENOUS

## 2011-10-22 SURGICAL SUPPLY — 72 items
APL SKNCLS STERI-STRIP NONHPOA (GAUZE/BANDAGES/DRESSINGS) ×1
BENZOIN TINCTURE PRP APPL 2/3 (GAUZE/BANDAGES/DRESSINGS) ×2 IMPLANT
BIT DRILL NEURO 2X3.1 SFT TUCH (MISCELLANEOUS) ×1 IMPLANT
BLADE SURG 15 STRL LF DISP TIS (BLADE) ×1 IMPLANT
BLADE SURG 15 STRL SS (BLADE) ×2
BLADE SURG ROTATE 9660 (MISCELLANEOUS) ×2 IMPLANT
BUR MATCHSTICK NEURO 3.0 LAGG (BURR) ×2 IMPLANT
CARTRIDGE OIL MAESTRO DRILL (MISCELLANEOUS) ×1 IMPLANT
CLOTH BEACON ORANGE TIMEOUT ST (SAFETY) ×2 IMPLANT
CLSR STERI-STRIP ANTIMIC 1/2X4 (GAUZE/BANDAGES/DRESSINGS) ×1 IMPLANT
COLLAR CERV LO CONTOUR FIRM DE (SOFTGOODS) IMPLANT
CORDS BIPOLAR (ELECTRODE) ×2 IMPLANT
COVER SURGICAL LIGHT HANDLE (MISCELLANEOUS) ×2 IMPLANT
CRADLE DONUT ADULT HEAD (MISCELLANEOUS) ×2 IMPLANT
DIFFUSER DRILL AIR PNEUMATIC (MISCELLANEOUS) ×2 IMPLANT
DRAIN JACKSON RD 7FR 3/32 (WOUND CARE) IMPLANT
DRAPE C-ARM 42X72 X-RAY (DRAPES) ×2 IMPLANT
DRAPE POUCH INSTRU U-SHP 10X18 (DRAPES) ×2 IMPLANT
DRAPE SURG 17X23 STRL (DRAPES) ×6 IMPLANT
DRILL NEURO 2X3.1 SOFT TOUCH (MISCELLANEOUS) ×2
DURAPREP 26ML APPLICATOR (WOUND CARE) ×2 IMPLANT
ELECT COATED BLADE 2.86 ST (ELECTRODE) ×2 IMPLANT
ELECT REM PT RETURN 9FT ADLT (ELECTROSURGICAL) ×2
ELECTRODE REM PT RTRN 9FT ADLT (ELECTROSURGICAL) ×1 IMPLANT
EVACUATOR SILICONE 100CC (DRAIN) IMPLANT
GAUZE SPONGE 4X4 16PLY XRAY LF (GAUZE/BANDAGES/DRESSINGS) ×2 IMPLANT
GLOVE BIO SURGEON STRL SZ8 (GLOVE) ×2 IMPLANT
GLOVE BIOGEL PI IND STRL 8 (GLOVE) ×1 IMPLANT
GLOVE BIOGEL PI INDICATOR 8 (GLOVE) ×1
GOWN SRG XL XLNG 56XLVL 4 (GOWN DISPOSABLE) ×1 IMPLANT
GOWN STRL NON-REIN LRG LVL3 (GOWN DISPOSABLE) ×2 IMPLANT
GOWN STRL NON-REIN XL XLG LVL4 (GOWN DISPOSABLE) ×2
IMPL S ENDOSKEL TC 7 ODEG (Orthopedic Implant) IMPLANT
IMPLANT S ENDOSKEL TC 7 ODEG (Orthopedic Implant) ×2 IMPLANT
IV CATH 14GX2 1/4 (CATHETERS) ×2 IMPLANT
KIT BASIN OR (CUSTOM PROCEDURE TRAY) ×2 IMPLANT
KIT ROOM TURNOVER OR (KITS) ×2 IMPLANT
MANIFOLD NEPTUNE II (INSTRUMENTS) ×2 IMPLANT
NDL SPNL 20GX3.5 QUINCKE YW (NEEDLE) ×1 IMPLANT
NEEDLE 27GAX1X1/2 (NEEDLE) ×2 IMPLANT
NEEDLE SPNL 20GX3.5 QUINCKE YW (NEEDLE) ×2 IMPLANT
NS IRRIG 1000ML POUR BTL (IV SOLUTION) ×2 IMPLANT
OIL CARTRIDGE MAESTRO DRILL (MISCELLANEOUS) ×2
PACK ORTHO CERVICAL (CUSTOM PROCEDURE TRAY) ×2 IMPLANT
PAD ARMBOARD 7.5X6 YLW CONV (MISCELLANEOUS) ×4 IMPLANT
PATTIES SURGICAL .5 X.5 (GAUZE/BANDAGES/DRESSINGS) IMPLANT
PATTIES SURGICAL .5 X1 (DISPOSABLE) ×1 IMPLANT
PIN DISTRACTION 14 (PIN) ×2 IMPLANT
PLATE LEVEL 1 TI VECTRA 14MM (Plate) ×1 IMPLANT
PUTTY BONE DBX 2.5 MIS (Bone Implant) ×1 IMPLANT
SCREW 4.0X14MM (Screw) ×8 IMPLANT
SCREW BN 14X4XSLF DRL VA SLF (Screw) IMPLANT
SPONGE GAUZE 4X4 12PLY (GAUZE/BANDAGES/DRESSINGS) ×2 IMPLANT
SPONGE INTESTINAL PEANUT (DISPOSABLE) ×2 IMPLANT
SPONGE SURGIFOAM ABS GEL 100 (HEMOSTASIS) ×1 IMPLANT
STRIP CLOSURE SKIN 1/2X4 (GAUZE/BANDAGES/DRESSINGS) ×2 IMPLANT
SURGIFLO TRUKIT (HEMOSTASIS) IMPLANT
SUT MNCRL AB 4-0 PS2 18 (SUTURE) ×1 IMPLANT
SUT SILK 4 0 (SUTURE)
SUT SILK 4-0 18XBRD TIE 12 (SUTURE) IMPLANT
SUT VIC AB 1 CT1 27 (SUTURE) ×2
SUT VIC AB 1 CT1 27XBRD ANBCTR (SUTURE) ×1 IMPLANT
SUT VIC AB 2-0 CT2 18 VCP726D (SUTURE) ×2 IMPLANT
SYR BULB IRRIGATION 50ML (SYRINGE) ×2 IMPLANT
SYR CONTROL 10ML LL (SYRINGE) ×4 IMPLANT
TAPE CLOTH 4X10 WHT NS (GAUZE/BANDAGES/DRESSINGS) ×2 IMPLANT
TAPE CLOTH SURG 4X10 WHT LF (GAUZE/BANDAGES/DRESSINGS) ×1 IMPLANT
TAPE UMBILICAL COTTON 1/8X30 (MISCELLANEOUS) ×2 IMPLANT
TOWEL OR 17X24 6PK STRL BLUE (TOWEL DISPOSABLE) ×2 IMPLANT
TOWEL OR 17X26 10 PK STRL BLUE (TOWEL DISPOSABLE) ×2 IMPLANT
WATER STERILE IRR 1000ML POUR (IV SOLUTION) ×2 IMPLANT
YANKAUER SUCT BULB TIP NO VENT (SUCTIONS) ×2 IMPLANT

## 2011-10-22 NOTE — Anesthesia Postprocedure Evaluation (Signed)
  Anesthesia Post-op Note  Patient: Elizabeth May  Procedure(s) Performed: Procedure(s) (LRB): ANTERIOR CERVICAL DECOMPRESSION/DISCECTOMY FUSION 1 LEVEL (Right)  Patient Location: PACU  Anesthesia Type: General  Level of Consciousness: awake, alert  and oriented  Airway and Oxygen Therapy: Patient Spontanous Breathing and Patient connected to nasal cannula oxygen  Post-op Pain: mild  Post-op Assessment: Post-op Vital signs reviewed and Patient's Cardiovascular Status Stable  Post-op Vital Signs: stable  Complications: No apparent anesthesia complications

## 2011-10-22 NOTE — Transfer of Care (Signed)
Immediate Anesthesia Transfer of Care Note  Patient: Elizabeth May  Procedure(s) Performed: Procedure(s) (LRB): ANTERIOR CERVICAL DECOMPRESSION/DISCECTOMY FUSION 1 LEVEL (Right)  Patient Location: PACU  Anesthesia Type: General  Level of Consciousness: awake, alert  and oriented  Airway & Oxygen Therapy: Patient Spontanous Breathing and Patient connected to nasal cannula oxygen  Post-op Assessment: Report given to PACU RN and Post -op Vital signs reviewed and stable  Post vital signs: Reviewed and stable  Complications: No apparent anesthesia complications

## 2011-10-22 NOTE — Progress Notes (Signed)
UR COMPLETED  

## 2011-10-22 NOTE — Progress Notes (Signed)
cbg [id number] 

## 2011-10-22 NOTE — Anesthesia Preprocedure Evaluation (Addendum)
Anesthesia Evaluation  Patient identified by MRN, date of birth, ID band Patient awake    Reviewed: Allergy & Precautions, H&P , NPO status , Patient's Chart, lab work & pertinent test results  Airway Mallampati: II TM Distance: >3 FB Neck ROM: limited    Dental  (+) Dental Advidsory Given, Teeth Intact, Dental Advisory Given and Caps   Pulmonary  breath sounds clear to auscultation        Cardiovascular hypertension, On Medications Rhythm:Regular Rate:Normal     Neuro/Psych  Headaches,    GI/Hepatic   Endo/Other  Well Controlled, Type 2, Oral Hypoglycemic AgentsHypothyroidism   Renal/GU      Musculoskeletal   Abdominal   Peds  Hematology   Anesthesia Other Findings   Reproductive/Obstetrics                         Anesthesia Physical Anesthesia Plan  ASA: III  Anesthesia Plan: General   Post-op Pain Management:    Induction: Intravenous  Airway Management Planned: Oral ETT  Additional Equipment:   Intra-op Plan:   Post-operative Plan: Extubation in OR  Informed Consent: I have reviewed the patients History and Physical, chart, labs and discussed the procedure including the risks, benefits and alternatives for the proposed anesthesia with the patient or authorized representative who has indicated his/her understanding and acceptance.   Dental Advisory Given and Dental advisory given  Plan Discussed with: Anesthesiologist, Surgeon and CRNA  Anesthesia Plan Comments:        Anesthesia Quick Evaluation

## 2011-10-22 NOTE — Preoperative (Signed)
Beta Blockers   Reason not to administer Beta Blockers:Not Applicable 

## 2011-10-22 NOTE — H&P (Signed)
PREOPERATIVE H&P  Chief Complaint: right arm pain  HPI: Elizabeth May is a 66 y.o. female who presents with right arm pain  Past Medical History  Diagnosis Date  . Hypertension   . Hypothyroidism   . Diabetes mellitus   . Headache     hx migraines   Past Surgical History  Procedure Date  . Breast surgery 80's    lft cyst   History   Social History  . Marital Status: Married    Spouse Name: N/A    Number of Children: N/A  . Years of Education: N/A   Social History Main Topics  . Smoking status: Never Smoker   . Smokeless tobacco: None   Comment: occ social  . Alcohol Use: Yes  . Drug Use: No  . Sexually Active: Yes    Birth Control/ Protection: Post-menopausal   Other Topics Concern  . None   Social History Narrative  . None   History reviewed. No pertinent family history. No Known Allergies Prior to Admission medications   Medication Sig Start Date End Date Taking? Authorizing Provider  amLODipine (NORVASC) 5 MG tablet Take 5 mg by mouth daily.   Yes Historical Provider, MD  atorvastatin (LIPITOR) 10 MG tablet Take 10 mg by mouth every other day.   Yes Historical Provider, MD  Calcium Carbonate-Vitamin D (CALCIUM-VITAMIN D) 500-200 MG-UNIT per tablet Take 1 tablet by mouth 2 (two) times daily with a meal.   Yes Historical Provider, MD  CINNAMON PO Take 1 capsule by mouth daily.   Yes Historical Provider, MD  cyclobenzaprine (FLEXERIL) 10 MG tablet Take 10 mg by mouth at bedtime as needed. For muscle spasms   Yes Historical Provider, MD  levothyroxine (SYNTHROID, LEVOTHROID) 50 MCG tablet Take 50 mcg by mouth daily.   Yes Historical Provider, MD  lisinopril-hydrochlorothiazide (PRINZIDE,ZESTORETIC) 20-25 MG per tablet Take 1 tablet by mouth daily.   Yes Historical Provider, MD  metFORMIN (GLUCOPHAGE) 500 MG tablet Take 1,000 mg by mouth 2 (two) times daily with a meal.    Yes Historical Provider, MD  traMADol (ULTRAM) 50 MG tablet Take 1 tablet (50 mg total)  by mouth every 6 (six) hours as needed for pain. Needs office visit for more 09/02/11  Yes Nelva Nay, PA-C     All other systems have been reviewed and were otherwise negative with the exception of those mentioned in the HPI and as above.  Physical Exam: Filed Vitals:   10/22/11 0612  BP: 103/63  Pulse: 67  Temp: 98.1 F (36.7 C)  Resp: 18    General: Alert, no acute distress Cardiovascular: No pedal edema Respiratory: No cyanosis, no use of accessory musculature GI: No organomegaly, abdomen is soft and non-tender Skin: No lesions in the area of chief complaint Neurologic: Sensation intact distally Psychiatric: Patient is competent for consent with normal mood and affect Lymphatic: No axillary or cervical lymphadenopathy  MUSCULOSKELETAL: + spurling's sign on right  Assessment/Plan: Right arm pain Plan for Procedure(s): ANTERIOR CERVICAL DECOMPRESSION/DISCECTOMY FUSION 1 LEVEL   Emilee Hero, MD 10/22/2011 6:53 AM

## 2011-10-22 NOTE — Anesthesia Procedure Notes (Signed)
Procedure Name: Intubation Date/Time: 10/22/2011 7:49 AM Performed by: Carmela Rima Pre-anesthesia Checklist: Patient identified, Timeout performed, Emergency Drugs available and Suction available Patient Re-evaluated:Patient Re-evaluated prior to inductionOxygen Delivery Method: Circle system utilized Preoxygenation: Pre-oxygenation with 100% oxygen Intubation Type: IV induction Ventilation: Mask ventilation without difficulty Laryngoscope Size: Mac and 3 Grade View: Grade II Tube type: Oral Tube size: 7.0 mm Number of attempts: 1 Airway Equipment and Method: LTA kit utilized Placement Confirmation: positive ETCO2,  ETT inserted through vocal cords under direct vision and breath sounds checked- equal and bilateral Secured at: 21 cm Tube secured with: Tape Dental Injury: Teeth and Oropharynx as per pre-operative assessment

## 2011-10-22 NOTE — Anesthesia Postprocedure Evaluation (Signed)
  Anesthesia Post-op Note  Patient: Elizabeth May  Procedure(s) Performed: Procedure(s) (LRB): ANTERIOR CERVICAL DECOMPRESSION/DISCECTOMY FUSION 1 LEVEL (Right)  Patient Location: PACU  Anesthesia Type: General  Level of Consciousness: awake, alert  and patient cooperative  Airway and Oxygen Therapy: Patient Spontanous Breathing and Patient connected to nasal cannula oxygen  Post-op Pain: mild  Post-op Assessment: Post-op Vital signs reviewed, Patient's Cardiovascular Status Stable, Respiratory Function Stable, Patent Airway and No signs of Nausea or vomiting  Post-op Vital Signs: Reviewed and stable  Complications: No apparent anesthesia complications

## 2011-10-23 LAB — GLUCOSE, CAPILLARY: Glucose-Capillary: 148 mg/dL — ABNORMAL HIGH (ref 70–99)

## 2011-10-23 NOTE — Op Note (Signed)
NAMERIFKY, LAPRE NO.:  [id number]  MEDICAL RECORD NO.:  [id number]  LOCATION:  5N10C                        FACILITY:  [facility]  PHYSICIAN:  Estill Bamberg, MD      DATE OF BIRTH:  [birth year]  DATE OF PROCEDURE:  10/22/2011 DATE OF DISCHARGE:                              OPERATIVE REPORT   PREOPERATIVE DIAGNOSES: 1. Right-sided C6 radiculopathy. 2. Right-sided C5-6 disk herniation resulting in compression of the     right-sided C6 nerve.  POSTOPERATIVE DIAGNOSES: 1. Right-sided C6 radiculopathy. 2. Right-sided C5-6 disk herniation resulting in compression of the     right-sided C6 nerve.  PROCEDURE: 1. C5-6 anterior cervical decompression and fusion. 2. Placement of interbody device x1 (Titan interbody spacer, small 7     mm parallel spacer). 3. Placement of anterior instrumentation:  C5-C6. 4. Use of local autograft. 5. Use of morselized allograft (DBX mix). 6. Intraoperative use of fluoroscopy.  SURGEON:  Estill Bamberg, MD  ASSISTANT:  Janace Litten, OPA  ANESTHESIA:  General endotracheal anesthesia.  COMPLICATIONS:  None.  DISPOSITION:  Stable.  ESTIMATED BLOOD LOSS:  Minimal.  INDICATIONS FOR PROCEDURE:  Briefly, [name] is an extremely pleasant 66 year old female, who presented to me on September 25, 2011, with severe pain in the right arm.  At that point on that date of exam, the pain was present for approximately 6 weeks.  She did have conservative care, but continued to have pain.  I did review an MRI, which was notable for a prominent and large right-sided C5-6 disk herniation, which currently was causing compression of the exiting C6 nerve on the right side.  I did see the patient back 2 weeks later and at that point, an MRI was reviewed.  The patient did have physical therapy with no relief.  Given the failure of conservative care, we did have a discussion regarding going forward with a C5-6 anterior cervical decompression and  fusion. The patient fully understood the risks and limitations of the procedure as outlined in my preoperative note.  OPERATIVE DETAILS:  On October 22, 2011, the patient was brought to the surgery and general endotracheal anesthesia was administered.  The patient was placed supine on a well-padded hospital bed.  The neck was placed in a gentle degree of extension.  The arms were secured to the patient's sides and the ulnar nerves were protected.  The shoulders were taped to the inferior aspect of the bed.  A time-out procedure was performed.  I then brought in lateral fluoroscopy, did Neshia Mckenzie out the optimal location of the left transverse incision.  The neck was then prepped and draped in usual sterile fashion.  SCDs were placed.  A transverse incision was then made from the midline to the medial border of the sternocleidomastoid muscle.  The platysma was readily identified and sharply incised.  The plane between the sternocleidomastoid muscle laterally and the strap muscles medially was readily identified and explored.  The prevertebral fascia was readily identified and bluntly swept away using peanuts.  I then obtained an intraoperative lateral fluoroscopic view to confirm the appropriate operative level.  The vertebral bodies of C5 and C6 were then subperiosteally exposed.  The self-retaining Shadow-Line  retractor was placed.  Caspar pins were placed into the C5 and C6 vertebral bodies, and gentle distraction was applied.  I then used a 15-blade knife to perform an annulotomy anteriorly.  A thorough diskectomy was performed using a series of curettes and Kerrison punches in addition to a 3-mm high-speed bur.  Of note, there was a substantial cavity of the upper endplate, but this was even now using a Kerrison and a bur.  The posterior longitudinal ligament was readily identified and was entered using a nerve hook.  I then used a #1 followed by #2 Kerrison to take down the  posterior longitudinal ligament across both the right and left sides.  Of note, upon covering the neural foramen on the right side, there was noted to be a prominent area of compression secondary to a disk osteophyte complex.  There was readily identified a rather substantial disk protrusion, which was occupying the neural foramen on the right.  The protrusion was actually somewhat adherent to the vertebral bodies above and below, but I did meticulously remove the disk fragments that were causing compression of the traversing nerve.  At the termination of this portion of the procedure, I was easily able to pass a nerve hook out the right neural foramen.  This did confirm adequate decompression.  At this point, I turned my attention towards the C5 and C6 endplates.  They were gently prepared using a bur.  I then placed a series of trials and did feel that a 7 mm parallel small interbody trial would be the most appropriate fit.  The trial was packed with DBX mix in addition to autograft obtained for removing osteophytes anteriorly.  The trial was tamped into position in the usual fashion.  I was very pleased with the final press fit of the implant.  I was very pleased with the location of the implant on both AP and lateral fluoroscopy.  I then chose a 14 mm Vectra plate.  This was placed over the anterior cervical spine and a total of 4, 14 mm self-drilling, self-tapping variable angle screws were placed through the plate.  I was very pleased with the final appearance of the AP and lateral intraoperative radiographs.  The wound was then copiously irrigated.  I did turn my attention towards identifying any undue bleeding and there was none.  The platysma was then closed using 2- 0 Vicryl and the skin was closed using 4-0 Monocryl.  Benzoin and Steri- Strips were applied followed by sterile dressing.  All instrument counts were correct at the termination of the procedure.  The patient was  then awoken from general endotracheal anesthesia and transferred to recovery in stable condition.  Of note, Janace Litten was my assistant throughout the procedure and aided in essential retraction and suctioning required throughout the surgery.     Estill Bamberg, MD     MD/MEDQ  D:  10/22/2011  T:  10/23/2011  Job:  [id number]  cc:   Loree Fee, PA

## 2011-10-23 NOTE — Progress Notes (Signed)
Patient comfortable.  Arm pain resolved.  BP 113/68  Pulse 79  Temp 98.4 F (36.9 C) (Oral)  Resp 18  SpO2 97%  NVI Collar in place Dressing CDI  S/p C5/6 acdf  - maintain collar - phllly collar to bedside for showering - d/c home and f/u 2 weeks

## 2011-10-23 NOTE — Progress Notes (Signed)
Orthopedic Tech Progress Note Patient Details:  Elizabeth May 1946-02-13 [id number]  Ortho Devices Type of Ortho Device: [place] cervical collar Ortho Device/Splint Interventions: Application   Shawnie Pons 10/23/2011, 8:53 AM

## 2011-10-24 NOTE — Discharge Summary (Signed)
NAMEIREENE, BALLOWE NO.:  [id number]  MEDICAL RECORD NO.:  [id number]  LOCATION:  5N10C                        FACILITY:  [facility]  PHYSICIAN:  Estill Bamberg, MD      DATE OF BIRTH:  10/06/1945  DATE OF ADMISSION:  10/22/2011 DATE OF DISCHARGE:  10/23/2011                              DISCHARGE SUMMARY   ADMISSION DIAGNOSIS:  Right-sided C6 radiculopathy.  DISCHARGE DIAGNOSIS:  Right-sided C6 radiculopathy.  ADMISSION HISTORY:  Briefly, [name] is an extremely pleasant 66 year old female, who presented to me with severe pain in the right arm.  The patient failed conservative care.  MRI was notable for a right-sided C5- 6 disk herniation causing obvious compression of the right-sided C6 nerve.  The patient failed conservative care and therefore was admitted on October 22, 2011 for a C5-6 ACDF.  HOSPITAL COURSE:  On October 22, 2011, the patient was brought to surgery and underwent the procedure reflected above.  The patient tolerated the procedure well and was transferred to recovery in stable condition.  The patient was evaluated by me postoperatively and on the morning of postoperative day #1.  On the morning of postoperative day #1, the patient's pain was well controlled.  Right arm pain was resolved.  She was tolerating a diet well and was uneventfully discharged home.  DISCHARGE INSTRUCTIONS:  The patient will wear her Asp[place] collar at all times.  She was given a [place] collar to be used while showering. She will follow up in my office in approximately 2 weeks after her procedure.     Estill Bamberg, MD     MD/MEDQ  D:  10/24/2011  T:  10/24/2011  Job:  [id number]

## 2011-10-26 ENCOUNTER — Encounter (HOSPITAL_COMMUNITY): Payer: Self-pay | Admitting: Orthopedic Surgery

## 2013-03-17 DIAGNOSIS — E039 Hypothyroidism, unspecified: Secondary | ICD-10-CM | POA: Insufficient documentation

## 2013-03-17 DIAGNOSIS — E119 Type 2 diabetes mellitus without complications: Secondary | ICD-10-CM

## 2013-03-21 ENCOUNTER — Ambulatory Visit: Payer: Self-pay | Admitting: Internal Medicine

## 2013-03-21 DIAGNOSIS — E1169 Type 2 diabetes mellitus with other specified complication: Secondary | ICD-10-CM | POA: Insufficient documentation

## 2013-03-21 DIAGNOSIS — E559 Vitamin D deficiency, unspecified: Secondary | ICD-10-CM | POA: Insufficient documentation

## 2013-03-21 NOTE — Progress Notes (Signed)
Patient ID: Elizabeth May, female   DOB: [birth year], 67 y.o.   MRN: [id number]

## 2013-04-04 ENCOUNTER — Encounter: Payer: Self-pay | Admitting: Internal Medicine

## 2013-04-04 ENCOUNTER — Ambulatory Visit (INDEPENDENT_AMBULATORY_CARE_PROVIDER_SITE_OTHER): Payer: 59 | Admitting: Internal Medicine

## 2013-04-04 VITALS — BP 116/62 | HR 64 | Temp 97.9°F | Resp 16 | Wt 142.8 lb

## 2013-04-04 DIAGNOSIS — I10 Essential (primary) hypertension: Secondary | ICD-10-CM

## 2013-04-04 DIAGNOSIS — E119 Type 2 diabetes mellitus without complications: Secondary | ICD-10-CM

## 2013-04-04 DIAGNOSIS — E782 Mixed hyperlipidemia: Secondary | ICD-10-CM

## 2013-04-04 DIAGNOSIS — R059 Cough, unspecified: Secondary | ICD-10-CM

## 2013-04-04 DIAGNOSIS — Z79899 Other long term (current) drug therapy: Secondary | ICD-10-CM

## 2013-04-04 DIAGNOSIS — E559 Vitamin D deficiency, unspecified: Secondary | ICD-10-CM

## 2013-04-04 DIAGNOSIS — R05 Cough: Secondary | ICD-10-CM

## 2013-04-04 LAB — CBC WITH DIFFERENTIAL/PLATELET
BASOS PCT: 0 % (ref 0–1)
Basophils Absolute: 0 10*3/uL (ref 0.0–0.1)
Eosinophils Absolute: 0.1 10*3/uL (ref 0.0–0.7)
Eosinophils Relative: 1 % (ref 0–5)
HEMATOCRIT: 39.3 % (ref 36.0–46.0)
HEMOGLOBIN: 13.1 g/dL (ref 12.0–15.0)
LYMPHS ABS: 2 10*3/uL (ref 0.7–4.0)
LYMPHS PCT: 28 % (ref 12–46)
MCH: 28.3 pg (ref 26.0–34.0)
MCHC: 33.3 g/dL (ref 30.0–36.0)
MCV: 84.9 fL (ref 78.0–100.0)
MONOS PCT: 5 % (ref 3–12)
Monocytes Absolute: 0.4 10*3/uL (ref 0.1–1.0)
NEUTROS ABS: 4.8 10*3/uL (ref 1.7–7.7)
NEUTROS PCT: 66 % (ref 43–77)
Platelets: 391 10*3/uL (ref 150–400)
RBC: 4.63 MIL/uL (ref 3.87–5.11)
RDW: 14 % (ref 11.5–15.5)
WBC: 7.3 10*3/uL (ref 4.0–10.5)

## 2013-04-04 LAB — LIPID PANEL
CHOL/HDL RATIO: 2.7 ratio
CHOLESTEROL: 130 mg/dL (ref 0–200)
HDL: 49 mg/dL (ref >39)
LDL CALC: 54 mg/dL (ref 0–99)
TRIGLYCERIDES: 135 mg/dL (ref <150)
VLDL: 27 mg/dL (ref 0–40)

## 2013-04-04 LAB — HEPATIC FUNCTION PANEL
ALK PHOS: 60 U/L (ref 39–117)
ALT: 10 U/L (ref 0–35)
AST: 12 U/L (ref 0–37)
Albumin: 4.3 g/dL (ref 3.5–5.2)
BILIRUBIN DIRECT: 0.1 mg/dL (ref 0.0–0.3)
BILIRUBIN INDIRECT: 0.3 mg/dL (ref 0.0–0.9)
Total Bilirubin: 0.4 mg/dL (ref 0.3–1.2)
Total Protein: 6.4 g/dL (ref 6.0–8.3)

## 2013-04-04 LAB — BASIC METABOLIC PANEL WITH GFR
BUN: 27 mg/dL — ABNORMAL HIGH (ref 6–23)
CHLORIDE: 102 meq/L (ref 96–112)
CO2: 26 mEq/L (ref 19–32)
Calcium: 9.7 mg/dL (ref 8.4–10.5)
Creat: 1.17 mg/dL — ABNORMAL HIGH (ref 0.50–1.10)
GFR, EST AFRICAN AMERICAN: 57 mL/min — AB
GFR, Est Non African American: 49 mL/min — ABNORMAL LOW
GLUCOSE: 120 mg/dL — AB (ref 70–99)
POTASSIUM: 4.8 meq/L (ref 3.5–5.3)
SODIUM: 138 meq/L (ref 135–145)

## 2013-04-04 LAB — HEMOGLOBIN A1C
Hgb A1c MFr Bld: 6.8 % — ABNORMAL HIGH (ref <5.7)
Mean Plasma Glucose: 148 mg/dL — ABNORMAL HIGH (ref <117)

## 2013-04-04 LAB — MAGNESIUM: Magnesium: 1.8 mg/dL (ref 1.5–2.5)

## 2013-04-04 MED ORDER — PROMETHAZINE-CODEINE 6.25-10 MG/5ML PO SYRP: ORAL_SOLUTION | ORAL | Status: DC

## 2013-04-04 MED ORDER — TRIAZOLAM 0.125 MG PO TABS: 0.1250 mg | ORAL_TABLET | Freq: Every evening | ORAL | Status: DC | PRN

## 2013-04-04 NOTE — Progress Notes (Signed)
Patient ID: Elizabeth May, female   DOB: 09-22-1945, 68 y.o.   MRN: [id number]   This very nice 68 y.o. MWF presents for 3 month follow up with Hypertension, Hyperlipidemia, Pre-Diabetes and Vitamin D Deficiency.    HTN predates since 2006. BP has been controlled at home. Today's BP: 116/62 mmHg.  In Sept 2014 , BUN/Creat 25/1.18 w/calc GFR 49 in moderate insufficiency range - most likely due to both Hypertensive Nephrosclerosis and Diabetic GlomerulosclerosisPatient denies any cardiac type chest pain, palpitations, dyspnea/orthopnea/PND, dizziness, claudication, or dependent edema. She exercises 5-7 x week at the gym.   Hyperlipidemia is controlled with diet & meds. Last Cholesterol was 151, Triglycerides were 125, HDL 57 and LDL 69 in Aug 2014 at goal. Patient denies myalgias or other med SE's.    Also, the patient has history of T2 NIDDM since 2006 with last A1c of 6.4 % in Aug 2014. She reports occasional and sporadic CBG ranging betw 120-140 mg %. Patient denies any symptoms of reactive hypoglycemia, diabetic polys, paresthesias or visual blurring.   Further, Patient has history of Vitamin D Deficiency of 32 in 2008 with last vitamin D of 59 in Aug 2014. Patient supplements vitamin D without any suspected side-effects.    Medication List       amLODipine 5 MG tablet  Commonly known as:  NORVASC  Take 5 mg by mouth daily.     atorvastatin 10 MG tablet  Commonly known as:  LIPITOR  Take 10 mg by mouth every other day.     calcium-vitamin D 500-200 MG-UNIT per tablet  Take 1 tablet by mouth 2 (two) times daily with a meal.     CINNAMON PO  Take 1 capsule by mouth daily. Not taking on a regular basis     levothyroxine 50 MCG tablet  Commonly known as:  SYNTHROID, LEVOTHROID  Take 50 mcg by mouth daily.     lisinopril-hydrochlorothiazide 20-25 MG per tablet  Commonly known as:  PRINZIDE,ZESTORETIC  Take 1 tablet by mouth daily.     metFORMIN 500 MG tablet  Commonly known as:   GLUCOPHAGE  Take 1,000 mg by mouth 2 (two) times daily with a meal.     promethazine-codeine 6.25-10 MG/5ML syrup  Commonly known as:  PHENERGAN with CODEINE  1 t 2 teaspoons every 3 to 4 hours as needed for severe cough     triazolam 0.125 MG tablet  Commonly known as:  HALCION  Take 1 tablet (0.125 mg total) by mouth at bedtime as needed for sleep.         No Known Allergies  PMHx:   Past Medical History  Diagnosis Date  . Hypertension   . Diabetes mellitus   . Headache(784.0)     hx migraines  . Hypothyroidism   . Hyperlipidemia     FHx:    Reviewed / unchanged  SHx:    Reviewed / unchanged  Systems Review: Constitutional: Denies fever, chills, wt changes, headaches, insomnia, fatigue, night sweats, change in appetite. Eyes: Denies redness, blurred vision, diplopia, discharge, itchy, watery eyes.  ENT: Denies discharge, congestion, post nasal drip, epistaxis, sore throat, earache, hearing loss, dental pain, tinnitus, vertigo, sinus pain, snoring.  CV: Denies chest pain, palpitations, irregular heartbeat, syncope, dyspnea, diaphoresis, orthopnea, PND, claudication, edema. Respiratory: denies cough, dyspnea, DOE, pleurisy, hoarseness, laryngitis, wheezing.  Gastrointestinal: Denies dysphagia, odynophagia, heartburn, reflux, water brash, abdominal pain or cramps, nausea, vomiting, bloating, diarrhea, constipation, hematemesis, melena, hematochezia,  or hemorrhoids. Genitourinary: Denies dysuria,  frequency, urgency, nocturia, hesitancy, discharge, hematuria, flank pain. Musculoskeletal: Denies arthralgias, myalgias, stiffness, jt. swelling, pain, limp, strain/sprain.  Skin: Denies pruritus, rash, hives, warts, acne, eczema, change in skin lesion(s). Neuro: No weakness, tremor, incoordination, spasms, paresthesia, or pain. Psychiatric: Denies confusion, memory loss, or sensory loss. Endo: Denies change in weight, skin, hair change.  Heme/Lymph: No excessive bleeding,  bruising, orenlarged lymph nodes.  Filed Vitals:   04/04/13 1101  BP: 116/62  Pulse: 64  Temp: 97.9 F (36.6 C)  Resp: 16    Estimated body mass index is 25.3 kg/(m^2) as calculated from the following:   Height as of 10/16/11: 5\' 3"  (1.6 m).   Weight as of this encounter: 142 lb 12.8 oz (64.774 kg).  On Exam: Appears well nourished - in no distress. Eyes: PERRLA, EOMs, conjunctiva no swelling or erythema. Sinuses: No frontal/maxillary tenderness ENT/Mouth: EAC's clear, TM's nl w/o erythema, bulging. Nares clear w/o erythema, swelling, exudates. Oropharynx clear without erythema or exudates. Oral hygiene is good. Tongue normal, non obstructing. Hearing intact.  Neck: Supple. Thyroid nl. Car 2+/2+ without bruits, nodes or JVD. Chest: Respirations nl with BS clear & equal w/o rales, rhonchi, wheezing or stridor.  Cor: Heart sounds normal w/ regular rate and rhythm without sig. murmurs, gallops, clicks, or rubs. Peripheral pulses normal and equal  without edema.  Abdomen: Soft & bowel sounds normal. Non-tender w/o guarding, rebound, hernias, masses, or organomegaly.  Lymphatics: Unremarkable.  Musculoskeletal: Full ROM all peripheral extremities, joint stability, 5/5 strength, and normal gait.  Skin: Warm, dry without exposed rashes, lesions, ecchymosis apparent.  Neuro: Cranial nerves intact, reflexes equal bilaterally. Sensory-motor testing grossly intact. Tendon reflexes grossly intact.  Pysch: Alert & oriented x 3. Insight and judgement nl & appropriate. No ideations.  Assessment and Plan:  1. Hypertension - Continue monitor blood pressure at home. Continue diet/meds same.  2. Hyperlipidemia - Continue diet/meds, exercise,& lifestyle modifications. Continue monitor periodic cholesterol/liver & renal functions   3. Diabetes - continue recommend prudent low glycemic diet, weight control, regular exercise, diabetic monitoring and periodic eye exams.  4. Vitamin D Deficiency -  Continue supplementation.  Recommended regular exercise, BP monitoring, weight control, and discussed med and SE's. Recommended labs to assess and monitor clinical status. Further disposition pending results of labs.

## 2013-04-04 NOTE — Patient Instructions (Signed)
Hypertension As your heart beats, it forces blood through your arteries. This force is your blood pressure. If the pressure is too high, it is called hypertension (HTN) or high blood pressure. HTN is dangerous because you may have it and not know it. High blood pressure may mean that your heart has to work harder to pump blood. Your arteries may be narrow or stiff. The extra work puts you at risk for heart disease, stroke, and other problems.  Blood pressure consists of two numbers, a higher number over a lower, 110/72, for example. It is stated as "110 over 72." The ideal is below 120 for the top number (systolic) and under 80 for the bottom (diastolic). Write down your blood pressure today. You should pay close attention to your blood pressure if you have certain conditions such as:  Heart failure.  Prior heart attack.  Diabetes  Chronic kidney disease.  Prior stroke.  Multiple risk factors for heart disease. To see if you have HTN, your blood pressure should be measured while you are seated with your arm held at the level of the heart. It should be measured at least twice. A one-time elevated blood pressure reading (especially in the Emergency Department) does not mean that you need treatment. There may be conditions in which the blood pressure is different between your right and left arms. It is important to see your caregiver soon for a recheck. Most people have essential hypertension which means that there is not a specific cause. This type of high blood pressure may be lowered by changing lifestyle factors such as:  Stress.  Smoking.  Lack of exercise.  Excessive weight.  Drug/tobacco/alcohol use.  Eating less salt. Most people do not have symptoms from high blood pressure until it has caused damage to the body. Effective treatment can often prevent, delay or reduce that damage. TREATMENT  When a cause has been identified, treatment for high blood pressure is directed at the  cause. There are a large number of medications to treat HTN. These fall into several categories, and your caregiver will help you select the medicines that are best for you. Medications may have side effects. You should review side effects with your caregiver. If your blood pressure stays high after you have made lifestyle changes or started on medicines,   Your medication(s) may need to be changed.  Other problems may need to be addressed.  Be certain you understand your prescriptions, and know how and when to take your medicine.  Be sure to follow up with your caregiver within the time frame advised (usually within two weeks) to have your blood pressure rechecked and to review your medications.  If you are taking more than one medicine to lower your blood pressure, make sure you know how and at what times they should be taken. Taking two medicines at the same time can result in blood pressure that is too low. SEEK IMMEDIATE MEDICAL CARE IF:  You develop a severe headache, blurred or changing vision, or confusion.  You have unusual weakness or numbness, or a faint feeling.  You have severe chest or abdominal pain, vomiting, or breathing problems. MAKE SURE YOU:   Understand these instructions.  Will watch your condition.  Will get help right away if you are not doing well or get worse. Document Released: 03/09/2005 Document Revised: 06/01/2011 Document Reviewed: 10/28/2007 Exit[employer] Patient Information 2014 [employer].  Diabetes and Exercise Exercising regularly is important. It is not just about losing weight. It   has many health benefits, such as:  Improving your overall fitness, flexibility, and endurance.  Increasing your bone density.  Helping with weight control.  Decreasing your body fat.  Increasing your muscle strength.  Reducing stress and tension.  Improving your overall health. People with diabetes who exercise gain additional benefits because  exercise:  Reduces appetite.  Improves the body's use of blood sugar (glucose).  Helps lower or control blood glucose.  Decreases blood pressure.  Helps control blood lipids (such as cholesterol and triglycerides).  Improves the body's use of the hormone insulin by:  Increasing the body's insulin sensitivity.  Reducing the body's insulin needs.  Decreases the risk for heart disease because exercising:  Lowers cholesterol and triglycerides levels.  Increases the levels of good cholesterol (such as high-density lipoproteins [HDL]) in the body.  Lowers blood glucose levels. YOUR ACTIVITY PLAN  Choose an activity that you enjoy and set realistic goals. Your health care provider or diabetes educator can help you make an activity plan that works for you. You can break activities into 2 or 3 sessions throughout the day. Doing so is as good as one long session. Exercise ideas include:  Taking the dog for a walk.  Taking the stairs instead of the elevator.  Dancing to your favorite song.  Doing your favorite exercise with a friend. RECOMMENDATIONS FOR EXERCISING WITH TYPE 1 OR TYPE 2 DIABETES   Check your blood glucose before exercising. If blood glucose levels are greater than 240 mg/dL, check for urine ketones. Do not exercise if ketones are present.  Avoid injecting insulin into areas of the body that are going to be exercised. For example, avoid injecting insulin into:  The arms when playing tennis.  The legs when jogging.  Keep a record of:  Food intake before and after you exercise.  Expected peak times of insulin action.  Blood glucose levels before and after you exercise.  The type and amount of exercise you have done.  Review your records with your health care provider. Your health care provider will help you to develop guidelines for adjusting food intake and insulin amounts before and after exercising.  If you take insulin or oral hypoglycemic agents, watch  for signs and symptoms of hypoglycemia. They include:  Dizziness.  Shaking.  Sweating.  Chills.  Confusion.  Drink plenty of water while you exercise to prevent dehydration or heat stroke. Body water is lost during exercise and must be replaced.  Talk to your health care provider before starting an exercise program to make sure it is safe for you. Remember, almost any type of activity is better than none. Document Released: 05/30/2003 Document Revised: 11/09/2012 Document Reviewed: 08/16/2012 Exit[employer] Patient Information 2014 [employer].  Cholesterol Cholesterol is a white, waxy, fat-like protein needed by your body in small amounts. The liver makes all the cholesterol you need. It is carried from the liver by the blood through the blood vessels. Deposits (plaque) may build up on blood vessel walls. This makes the arteries narrower and stiffer. Plaque increases the risk for heart attack and stroke. You cannot feel your cholesterol level even if it is very high. The only way to know is by a blood test to check your lipid (fats) levels. Once you know your cholesterol levels, you should keep a record of the test results. Work with your caregiver to to keep your levels in the desired range. WHAT THE RESULTS MEAN:  Total cholesterol is a rough measure of all the cholesterol   in your blood.  LDL is the so-called bad cholesterol. This is the type that deposits cholesterol in the walls of the arteries. You want this level to be low.  HDL is the good cholesterol because it cleans the arteries and carries the LDL away. You want this level to be high.  Triglycerides are fat that the body can either burn for energy or store. High levels are closely linked to heart disease. DESIRED LEVELS:  Total cholesterol below 200.  LDL below 100 for people at risk, below 70 for very high risk.  HDL above 50 is good, above 60 is best.  Triglycerides below 150. HOW TO LOWER YOUR  CHOLESTEROL:  Diet.  Choose fish or white meat chicken and turkey, roasted or baked. Limit fatty cuts of red meat, fried foods, and processed meats, such as sausage and lunch meat.  Eat lots of fresh fruits and vegetables. Choose whole grains, beans, pasta, potatoes and cereals.  Use only small amounts of olive, corn or canola oils. Avoid butter, mayonnaise, shortening or palm kernel oils. Avoid foods with trans-fats.  Use skim/nonfat milk and low-fat/nonfat yogurt and cheeses. Avoid whole milk, cream, ice cream, egg yolks and cheeses. Healthy desserts include angel food cake, ginger snaps, animal crackers, hard candy, popsicles, and low-fat/nonfat frozen yogurt. Avoid pastries, cakes, pies and cookies.  Exercise.  A regular program helps decrease LDL and raises HDL.  Helps with weight control.  Do things that increase your activity level like gardening, walking, or taking the stairs.  Medication.  May be prescribed by your caregiver to help lowering cholesterol and the risk for heart disease.  You may need medicine even if your levels are normal if you have several risk factors. HOME CARE INSTRUCTIONS   Follow your diet and exercise programs as suggested by your caregiver.  Take medications as directed.  Have blood work done when your caregiver feels it is necessary. MAKE SURE YOU:   Understand these instructions.  Will watch your condition.  Will get help right away if you are not doing well or get worse. Document Released: 12/02/2000 Document Revised: 06/01/2011 Document Reviewed: 12/21/2012 Exit[employer] Patient Information 2014 [employer].  Vitamin D Deficiency Vitamin D is an important vitamin that your body needs. Having too little of it in your body is called a deficiency. A very bad deficiency can make your bones soft and can cause a condition called rickets.  Vitamin D is important to your body for different reasons, such as:   It helps your body absorb 2  minerals called calcium and phosphorus.  It helps make your bones healthy.  It may prevent some diseases, such as diabetes and multiple sclerosis.  It helps your muscles and heart. You can get vitamin D in several ways. It is a natural part of some foods. The vitamin is also added to some dairy products and cereals. Some people take vitamin D supplements. Also, your body makes vitamin D when you are in the sun. It changes the sun's rays into a form of the vitamin that your body can use. CAUSES   Not eating enough foods that contain vitamin D.  Not getting enough sunlight.  Having certain digestive system diseases that make it hard to absorb vitamin D. These diseases include Crohn's disease, chronic pancreatitis, and cystic fibrosis.  Having a surgery in which part of the stomach or small intestine is removed.  Being obese. Fat cells pull vitamin D out of your blood. That means that obese people   may not have enough vitamin D left in their blood and in other body tissues.  Having chronic kidney or liver disease. RISK FACTORS Risk factors are things that make you more likely to develop a vitamin D deficiency. They include:  Being older.  Not being able to get outside very much.  Living in a nursing home.  Having had broken bones.  Having weak or thin bones (osteoporosis).  Having a disease or condition that changes how your body absorbs vitamin D.  Having dark skin.  Some medicines such as seizure medicines or steroids.  Being overweight or obese. SYMPTOMS Mild cases of vitamin D deficiency may not have any symptoms. If you have a very bad case, symptoms may include:  Bone pain.  Muscle pain.  Falling often.  Broken bones caused by a minor injury, due to osteoporosis. DIAGNOSIS A blood test is the best way to tell if you have a vitamin D deficiency. TREATMENT Vitamin D deficiency can be treated in different ways. Treatment for vitamin D deficiency depends on what is  causing it. Options include:  Taking vitamin D supplements.  Taking a calcium supplement. Your caregiver will suggest what dose is best for you. HOME CARE INSTRUCTIONS  Take any supplements that your caregiver prescribes. Follow the directions carefully. Take only the suggested amount.  Have your blood tested 2 months after you start taking supplements.  Eat foods that contain vitamin D. Healthy choices include:  Fortified dairy products, cereals, or juices. Fortified means vitamin D has been added to the food. Check the label on the package to be sure.  Fatty fish like salmon or trout.  Eggs.  Oysters.  Do not use a tanning bed.  Keep your weight at a healthy level. Lose weight if you need to.  Keep all follow-up appointments. Your caregiver will need to perform blood tests to make sure your vitamin D deficiency is going away. SEEK MEDICAL CARE IF:  You have any questions about your treatment.  You continue to have symptoms of vitamin D deficiency.  You have nausea or vomiting.  You are constipated.  You feel confused.  You have severe abdominal or back pain. MAKE SURE YOU:  Understand these instructions.  Will watch your condition.  Will get help right away if you are not doing well or get worse. Document Released: 06/01/2011 Document Revised: 07/04/2012 Document Reviewed: 06/01/2011 Exit[employer] Patient Information 2014 [employer].  

## 2013-04-05 LAB — VITAMIN D 25 HYDROXY (VIT D DEFICIENCY, FRACTURES): Vit D, 25-Hydroxy: 66 ng/mL (ref 30–89)

## 2013-04-05 LAB — INSULIN, FASTING: INSULIN FASTING, SERUM: 5 u[IU]/mL (ref 3–28)

## 2013-04-05 LAB — TSH: TSH: 2.123 u[IU]/mL (ref 0.350–4.500)

## 2013-04-06 ENCOUNTER — Telehealth: Payer: Self-pay | Admitting: *Deleted

## 2013-04-10 ENCOUNTER — Telehealth: Payer: Self-pay | Admitting: *Deleted

## 2013-04-10 NOTE — Telephone Encounter (Signed)
Message copied by Reggy EyeEVANS, Rachele Lamaster C on Mon Apr 10, 2013  1:06 PM ------      Message from: Lucky CowboyMCKEOWN, WILLIAM      Created: Thu Apr 06, 2013  1:38 AM       cbc kidneys liver mag - all nl/ok thyroid nl/ok            Chol 130 great - vit 66 great - Aic 6.8 too    -    High - need stricter diet and weight loss.       ------

## 2013-04-12 NOTE — Telephone Encounter (Signed)
Patient aware of results.

## 2013-07-03 ENCOUNTER — Ambulatory Visit (INDEPENDENT_AMBULATORY_CARE_PROVIDER_SITE_OTHER): Payer: 59 | Admitting: Emergency Medicine

## 2013-07-03 ENCOUNTER — Encounter: Payer: Self-pay | Admitting: Emergency Medicine

## 2013-07-03 VITALS — BP 120/82 | HR 62 | Temp 98.6°F | Resp 16 | Ht 64.0 in | Wt 145.0 lb

## 2013-07-03 DIAGNOSIS — E119 Type 2 diabetes mellitus without complications: Secondary | ICD-10-CM

## 2013-07-03 DIAGNOSIS — Z1331 Encounter for screening for depression: Secondary | ICD-10-CM

## 2013-07-03 DIAGNOSIS — Z789 Other specified health status: Secondary | ICD-10-CM

## 2013-07-03 DIAGNOSIS — I10 Essential (primary) hypertension: Secondary | ICD-10-CM

## 2013-07-03 DIAGNOSIS — E782 Mixed hyperlipidemia: Secondary | ICD-10-CM

## 2013-07-03 DIAGNOSIS — J309 Allergic rhinitis, unspecified: Secondary | ICD-10-CM

## 2013-07-03 DIAGNOSIS — Z0000 Encounter for general adult medical examination without abnormal findings: Secondary | ICD-10-CM

## 2013-07-03 LAB — CBC WITH DIFFERENTIAL/PLATELET
BASOS ABS: 0.1 10*3/uL (ref 0.0–0.1)
BASOS PCT: 1 % (ref 0–1)
Eosinophils Absolute: 0.1 10*3/uL (ref 0.0–0.7)
Eosinophils Relative: 1 % (ref 0–5)
HEMATOCRIT: 40.4 % (ref 36.0–46.0)
Hemoglobin: 13.7 g/dL (ref 12.0–15.0)
Lymphocytes Relative: 30 % (ref 12–46)
Lymphs Abs: 2 10*3/uL (ref 0.7–4.0)
MCH: 28.4 pg (ref 26.0–34.0)
MCHC: 33.9 g/dL (ref 30.0–36.0)
MCV: 83.8 fL (ref 78.0–100.0)
MONO ABS: 0.3 10*3/uL (ref 0.1–1.0)
Monocytes Relative: 5 % (ref 3–12)
NEUTROS ABS: 4.2 10*3/uL (ref 1.7–7.7)
NEUTROS PCT: 63 % (ref 43–77)
Platelets: 421 10*3/uL — ABNORMAL HIGH (ref 150–400)
RBC: 4.82 MIL/uL (ref 3.87–5.11)
RDW: 14 % (ref 11.5–15.5)
WBC: 6.6 10*3/uL (ref 4.0–10.5)

## 2013-07-03 LAB — HEMOGLOBIN A1C
HEMOGLOBIN A1C: 7.3 % — AB (ref <5.7)
MEAN PLASMA GLUCOSE: 163 mg/dL — AB (ref <117)

## 2013-07-03 MED ORDER — METFORMIN HCL 500 MG PO TABS: 1000.0000 mg | ORAL_TABLET | Freq: Two times a day (BID) | ORAL | Status: DC

## 2013-07-03 MED ORDER — TRIAZOLAM 0.125 MG PO TABS: 0.1250 mg | ORAL_TABLET | Freq: Every evening | ORAL | Status: DC | PRN

## 2013-07-03 MED ORDER — DOXYCYCLINE HYCLATE 100 MG PO CAPS: 100.0000 mg | ORAL_CAPSULE | Freq: Two times a day (BID) | ORAL | Status: DC

## 2013-07-03 MED ORDER — AMLODIPINE BESYLATE 5 MG PO TABS: 5.0000 mg | ORAL_TABLET | Freq: Every day | ORAL | Status: DC

## 2013-07-03 MED ORDER — LEVOTHYROXINE SODIUM 50 MCG PO TABS: 50.0000 ug | ORAL_TABLET | Freq: Every day | ORAL | Status: DC

## 2013-07-03 MED ORDER — ATORVASTATIN CALCIUM 10 MG PO TABS: 10.0000 mg | ORAL_TABLET | ORAL | Status: DC

## 2013-07-03 MED ORDER — LISINOPRIL-HYDROCHLOROTHIAZIDE 20-25 MG PO TABS: 1.0000 | ORAL_TABLET | Freq: Every day | ORAL | Status: DC

## 2013-07-03 NOTE — Progress Notes (Signed)
Patient ID: Elizabeth FritzConstance W Dobis, female   DOB: Sep 08, 1945, 68 y.o.   MRN: [id number] Subjective:   Elizabeth May is a [age] y.o. female who presents for Medicare Annual Wellness Visit and 3 month follow up on hypertension, diabetes, hyperlipidemia, vitamin D def.  Date of last medicare wellness visit is unknown.   Her blood pressure has been controlled at home, today their BP is BP: 120/82 mmHg She does workout. She denies chest pain, shortness of breath, dizziness.  She is not on cholesterol medication and denies myalgias. Her cholesterol is at goal. The cholesterol last visit was:   Lab Results  Component Value Date   CHOL 148 07/03/2013   HDL 48 07/03/2013   LDLCALC 69 07/03/2013   TRIG 154* 07/03/2013   CHOLHDL 3.1 07/03/2013   She has been working on diet and exercise for diet controlled diabetes, and denies foot ulcerations, paresthesia of the feet, polyuria and visual disturbances. Last A1C in the office was:  Lab Results  Component Value Date   HGBA1C 7.3* 07/03/2013   Patient is on Vitamin D supplement.  Names of Other Physician/Practitioners you currently use: Patient Care Team: Lucky CowboyWilliam McKeown, MD as PCP - General (Internal Medicine) Erasmo DownerKelly Hill, MD as Referring Physician (Obstetrics and Gynecology) Kathleene Hazelhristopher D McAlhany, MD as Consulting Physician (Cardiology) Charna ElizabethJyothi Mann, MD as Consulting Physician (Gastroenterology) Emilee HeroMark Leonard Dumonski, MD as Consulting Physician (Orthopedic Surgery) Jefferson FuelWayne Graeber Woods, MD (Dermatology) Caryn SectionFox, [facility](Eye)  Medication Review Current Outpatient Prescriptions on File Prior to Visit  Medication Sig Dispense Refill  . Calcium Carbonate-Vitamin D (CALCIUM-VITAMIN D) 500-200 MG-UNIT per tablet Take 1 tablet by mouth 2 (two) times daily with a meal.      . CINNAMON PO Take 1 capsule by mouth daily. Not taking on a regular basis      . promethazine-codeine (PHENERGAN WITH CODEINE) 6.25-10 MG/5ML syrup 1 t 2 teaspoons every 3 to 4 hours as needed for  severe cough  120 mL  0   No current facility-administered medications on file prior to visit.    Current Problems (verified) Patient Active Problem List   Diagnosis Date Noted  . Mixed hyperlipidemia 03/21/2013  . Unspecified vitamin D deficiency 03/21/2013  . Type II or unspecified type diabetes mellitus without mention of complication, not stated as uncontrolled 03/17/2013  . Hypothyroidism   . Cervical radiculopathy at C6 10/22/2011  . HYPERTENSION 07/01/2009    Screening Tests Health Maintenance  Topic Date Due  . Foot Exam  05/10/1957  . Ophthalmology Exam  05/10/1957  . Urine Microalbumin  05/10/1957  . Pneumococcal Polysaccharide Vaccine Age 66 And Over  05/10/2012  . Mammogram  01/20/2013  . Influenza Vaccine  10/21/2013  . Hemoglobin A1c  01/02/2014  . Colonoscopy  03/23/2014  . Tetanus/tdap  07/16/2020  . Zostavax  Completed     Immunization History  Administered Date(s) Administered  . Influenza Split 02/13/2013  . Tdap 07/17/2010  . Zoster 01/14/2012    Preventative care: Last colonoscopy: 2006 Last mammogram: 2012 Last pap smear/pelvic exam: 8/ 2014 DEXA:2013 borderline osteopenia EYE: 2013 Dentist: Q 6 month  Prior vaccinations: TD or Tdap: 2012  Influenza: 2014 Pneumococcal: 2010 Shingles/Zostavax: 2013  History reviewed:  Past Medical History  Diagnosis Date  . Hypertension   . Diabetes mellitus   . Headache(784.0)     hx migraines  . Hypothyroidism   . Hyperlipidemia     Past Surgical History  Procedure Laterality Date  . Breast surgery  80's  lft cyst  . Anterior cervical decomp/discectomy fusion  10/22/2011    Procedure: ANTERIOR CERVICAL DECOMPRESSION/DISCECTOMY FUSION 1 LEVEL;  Surgeon: Emilee Hero, MD;  Location: [facility] OR;  Service: Orthopedics;  Laterality: Right;  Anterior cervical decompression fusion, cervical 5-6 with instrumentation and allograft.   History  Substance Use Topics  . Smoking status: Never Smoker    . Smokeless tobacco: Not on file     Comment: occ social  . Alcohol Use: 3.5 oz/week    7 drink(s) per week      Risk Factors: Osteoporosis: postmenopausal estrogen deficiency History of fracture in the past year: no  Tobacco History  Substance Use Topics  . Smoking status: Never Smoker   . Smokeless tobacco: Not on file     Comment: occ social  . Alcohol Use: 3.5 oz/week    7 drink(s) per week   She does not smoke.  Patient is not a former smoker. Are there smokers in your home (other than you)?  No  Alcohol Current alcohol use: social drinker  Caffeine Current caffeine use: coffee 2 /day  Exercise Exercise limitations: The patient has no exercise limitations. Current exercise: aerobics, no regular exercise and walking  Nutrition/Diet Current diet: in general, a "healthy" diet    Cardiac risk factors: advanced age (older than 89 for men, 62 for women) and hypertension.  Depression Screen Nurse depression screen reviewed.  (Note: if answer to either of the following is "Yes", a more complete depression screening is indicated)   Q1: Over the past two weeks, have you felt down, depressed or hopeless? No  Q2: Over the past two weeks, have you felt little interest or pleasure in doing things? No  Have you lost interest or pleasure in daily life? No  Do you often feel hopeless? No  Do you cry easily over simple problems? No  Activities of Daily Living Nurse ADLs screen reviewed.  In your present state of health, do you have any difficulty performing the following activities?:  Driving? No Managing money?  No Feeding yourself? No Getting from bed to chair? No Climbing a flight of stairs? No Preparing food and eating?: No Bathing or showering? No Getting dressed: No Getting to the toilet? No Using the toilet:No Moving around from place to place: No In the past year have you fallen or had a near fall?:No   Are you sexually active?  Yes  Do you have more  than one partner?  No  Vision Difficulties: No  Hearing Difficulties: No Do you often ask people to speak up or repeat themselves? No Do you experience ringing or noises in your ears? No Do you have difficulty understanding soft or whispered voices? No  Cognition  Do you feel that you have a problem with memory?No  Do you often misplace items? No  Do you feel safe at home?  Yes  Advanced directives Does patient have a Health Care Power of Attorney? No Does patient have a Living Will? No    Objective:     Vision and hearing screens reviewed.   Blood pressure 120/82, pulse 62, temperature 98.6 F (37 C), temperature source Temporal, resp. rate 16, height 5\' 4"  (1.626 m), weight 145 lb (65.772 kg). Body mass index is 24.88 kg/(m^2).  General appearance: alert, no distress, WD/WN,  female Cognitive Testing  Alert? Yes  Normal Appearance?Yes  Oriented to person? Yes  Place? Yes   Time? Yes  Recall of three objects?  Yes  Can perform simple  calculations? Yes  Displays appropriate judgment?Yes  Can read the correct time from a watch face?Yes  HEENT: normocephalic, sclerae anicteric, TMs pearly, nares patent, no discharge or erythema, pharynx normal Oral cavity: MMM, no lesions Neck: supple, no lymphadenopathy, no thyromegaly, no masses Heart: RRR, normal S1, S2, no murmurs Lungs: CTA bilaterally, no wheezes, rhonchi, or rales Abdomen: +bs, soft, non tender, non distended, no masses, no hepatomegaly, no splenomegaly Musculoskeletal: nontender, no swelling, no obvious deformity Extremities: no edema, no cyanosis, no clubbing Pulses: 2+ symmetric, upper and lower extremities, normal cap refill Skin: Dry edges bilateral feet, no calluses Neurological: alert, oriented x 3, CN2-12 intact, strength normal upper extremities and lower extremities, sensation normal throughout, DTRs 2+ throughout, no cerebellar signs, gait normal Psychiatric: normal affect, behavior normal, pleasant   Breast: def GYN Gyn: def GYN Rectal: def GYN   Assessment:  1. Medicare Wellness update- Update screening labs/ History/ Immunizations/ Testing as needed. Advised healthy diet, QD exercise, increase H20 and continue RX/ Vitamins AD.  2. 3 month F/U for  Cholesterol,DM, D. Deficient. Needs healthy diet, cardio QD and obtain healthy weight. Check Labs, Check BP if >130/80 call office, Check BS if >200 call office  3. dry skin DM- Advised needs podiatry evaluation with  DM Hx, epsom salt soaks, vaseline treatment QD, monitor QD and call with any concerns/ adverse changes  4. Allergic rhinitis- Allegra OTC, increase H2o, allergy hygiene explained.  Plan:  Also see above  During the course of the visit the patient was educated and counseled about appropriate screening and preventive services including:    Pneumococcal vaccine   Diabetes screening  Nutrition counseling   Screening recommendations, referrals:  Vaccinations: ALL FOLLOWING UP TO DATE OR DECLINES Except Pneumovax, we are out of stock patient will get at next OV Tdap vaccine no  Influenza vaccine no Pneumococcal vaccine yes Shingles vaccine no Hep B vaccine no  Nutrition assessed and recommended  Colonoscopy no Mammogram no Pap smear no Pelvic exam no Recommended yearly ophthalmology/optometry visit for glaucoma screening and checkup Recommended yearly dental visit for hygiene and checkup Advanced directives - no  Conditions/risks identified: BMI: Discussed weight loss, diet, and increase physical activity.  Increase physical activity: AHA recommends 150 minutes of physical activity a week.  Medications reviewed DEXA- requested Diabetes is not at goal, ACE/ARB therapy: No, Reason not on Ace Inhibitor/ARB therapy:  Diabetes and HTn controlled currently with close monitoring Urinary Incontinence is an issue: discussed non pharmacology and pharmacology options.  Fall risk: low- discussed PT, home fall  assessment, medications.   Medicare Attestation I have personally reviewed: The patient's medical and social history Their use of alcohol, tobacco or illicit drugs Their current medications and supplements The patient's functional ability including ADLs,fall risks, home safety risks, cognitive, and hearing and visual impairment Diet and physical activities Evidence for depression or mood disorders  The patient's weight, height, BMI, and visual acuity have been recorded in the chart.  I have made referrals, counseling, and provided education to the patient based on review of the above and I have provided the patient with a written personalized care plan for preventive services.     Berenice PrimasMelissa R Georgeana Oertel, PA-C   07/10/2013    CPT [id number] first AWV CPT [id number] subsequent AWV

## 2013-07-03 NOTE — Patient Instructions (Signed)
We want weight loss that will last so you should lose 1-2 pounds a week.  THAT IS IT! Please pick THREE things a month to change. Once it is a habit check off the item. Then pick another three items off the list to become habits.  If you are already doing a habit on the list GREAT!  Cross that item off! o Don't drink your calories. Ie, alcohol, soda, fruit juice, and sweet tea.  o Drink more water. Drink a glass when you feel hungry or before each meal.  o Eat breakfast - Complex carb and protein (likeDannon light and fit yogurt, oatmeal, fruit, eggs, Malaw[place]urkey bacon). o Measure your cereal.  Eat no more than one cup a day. (ie [place]ashi) o Eat an apple a day. o Add a vegetable a day. o Try a new vegetable a month. o Use Pam! Stop using oil or butter to cook. o Don't finish your plate or use smaller plates. o Share your dessert. o Eat sugar free Jello for dessert or frozen grapes. o Don't eat 2-3 hours before bed. o Switch to whole wheat bread, pasta, and brown rice. o Make healthier choices when you eat out. No fries! o Pick baked chicken, NOT fried. o Don't forget to SLOW DOWN when you eat. It is not going anywhere.  o Take the stairs. o Park far away in the parking lot o State Farm[employer]ift soup cans (or weights) for 10 minutes while watching TV. o Walk at work for 10 minutes during break. o Walk outside 1 time a week with your friend, kids, dog, or significant other. o Start a walking group at church. o Walk the mall as much as you can tolerate.  o Keep a food diary. o Weigh yourself daily. o Walk for 15 minutes 3 days per week. o Cook at home more often and eat out less.  If life happens and you go back to old habits, it is okay.  Just start over. You can do it!   If you experience chest pain, get short of breath, or tired during the exercise, please stop immediately and inform your doctor.  Allergic Rhinitis Allergic rhinitis is when the mucous membranes in the nose respond to allergens. Allergens  are particles in the air that cause your body to have an allergic reaction. This causes you to release allergic antibodies. Through a chain of events, these eventually cause you to release histamine into the blood stream. Although meant to protect the body, it is this release of histamine that causes your discomfort, such as frequent sneezing, congestion, and an itchy, runny nose.  CAUSES  Seasonal allergic rhinitis (hay fever) is caused by pollen allergens that may come from grasses, trees, and weeds. Year-round allergic rhinitis (perennial allergic rhinitis) is caused by allergens such as house dust mites, pet dander, and mold spores.  SYMPTOMS   Nasal stuffiness (congestion).  Itchy, runny nose with sneezing and tearing of the eyes. DIAGNOSIS  Your health care provider can help you determine the allergen or allergens that trigger your symptoms. If you and your health care provider are unable to determine the allergen, skin or blood testing may be used. TREATMENT  Allergic Rhinitis does not have a cure, but it can be controlled by:  Medicines and allergy shots (immunotherapy).  Avoiding the allergen. Hay fever may often be treated with antihistamines in pill or nasal spray forms. Antihistamines block the effects of histamine. There are over-the-counter medicines that may help with nasal congestion and  swelling around the eyes. Check with your health care provider before taking or giving this medicine.  If avoiding the allergen or the medicine prescribed do not work, there are many new medicines your health care provider can prescribe. Stronger medicine may be used if initial measures are ineffective. Desensitizing injections can be used if medicine and avoidance does not work. Desensitization is when a patient is given ongoing shots until the body becomes less sensitive to the allergen. Make sure you follow up with your health care provider if problems continue. HOME CARE INSTRUCTIONS It is not  possible to completely avoid allergens, but you can reduce your symptoms by taking steps to limit your exposure to them. It helps to know exactly what you are allergic to so that you can avoid your specific triggers. SEEK MEDICAL CARE IF:   You have a fever.  You develop a cough that does not stop easily (persistent).  You have shortness of breath.  You start wheezing.  Symptoms interfere with normal daily activities. Document Released: 12/02/2000 Document Revised: 12/28/2012 Document Reviewed: 11/14/2012 [facility] Patient Information 2014 [place], [place].

## 2013-07-04 LAB — BASIC METABOLIC PANEL WITH GFR
BUN: 24 mg/dL — ABNORMAL HIGH (ref 6–23)
CO2: 25 mEq/L (ref 19–32)
Calcium: 9.2 mg/dL (ref 8.4–10.5)
Chloride: 101 mEq/L (ref 96–112)
Creat: 0.95 mg/dL (ref 0.50–1.10)
GFR, EST AFRICAN AMERICAN: 72 mL/min
GFR, EST NON AFRICAN AMERICAN: 63 mL/min
Glucose, Bld: 110 mg/dL — ABNORMAL HIGH (ref 70–99)
POTASSIUM: 4.3 meq/L (ref 3.5–5.3)
SODIUM: 140 meq/L (ref 135–145)

## 2013-07-04 LAB — LIPID PANEL
CHOL/HDL RATIO: 3.1 ratio
Cholesterol: 148 mg/dL (ref 0–200)
HDL: 48 mg/dL (ref >39)
LDL CALC: 69 mg/dL (ref 0–99)
Triglycerides: 154 mg/dL — ABNORMAL HIGH (ref <150)
VLDL: 31 mg/dL (ref 0–40)

## 2013-07-04 LAB — HEPATIC FUNCTION PANEL
ALK PHOS: 66 U/L (ref 39–117)
ALT: 11 U/L (ref 0–35)
AST: 16 U/L (ref 0–37)
Albumin: 4.3 g/dL (ref 3.5–5.2)
BILIRUBIN DIRECT: 0.1 mg/dL (ref 0.0–0.3)
BILIRUBIN TOTAL: 0.4 mg/dL (ref 0.2–1.2)
Indirect Bilirubin: 0.3 mg/dL (ref 0.2–1.2)
Total Protein: 6.6 g/dL (ref 6.0–8.3)

## 2013-07-04 LAB — INSULIN, FASTING: INSULIN FASTING, SERUM: 3 u[IU]/mL (ref 3–28)

## 2013-07-10 ENCOUNTER — Encounter: Payer: Self-pay | Admitting: Emergency Medicine

## 2013-10-06 IMAGING — CR DG CHEST 2V
2 series · 2 of 2 positions shown · non-contrast
Comparison: 07/17/2010

CLINICAL DATA: Preoperative evaluation for cervical fusion.
History of hypertension and diabetes.  No current chest complaints

CHEST - 2 VIEW

[view not recorded (1 of 2)]
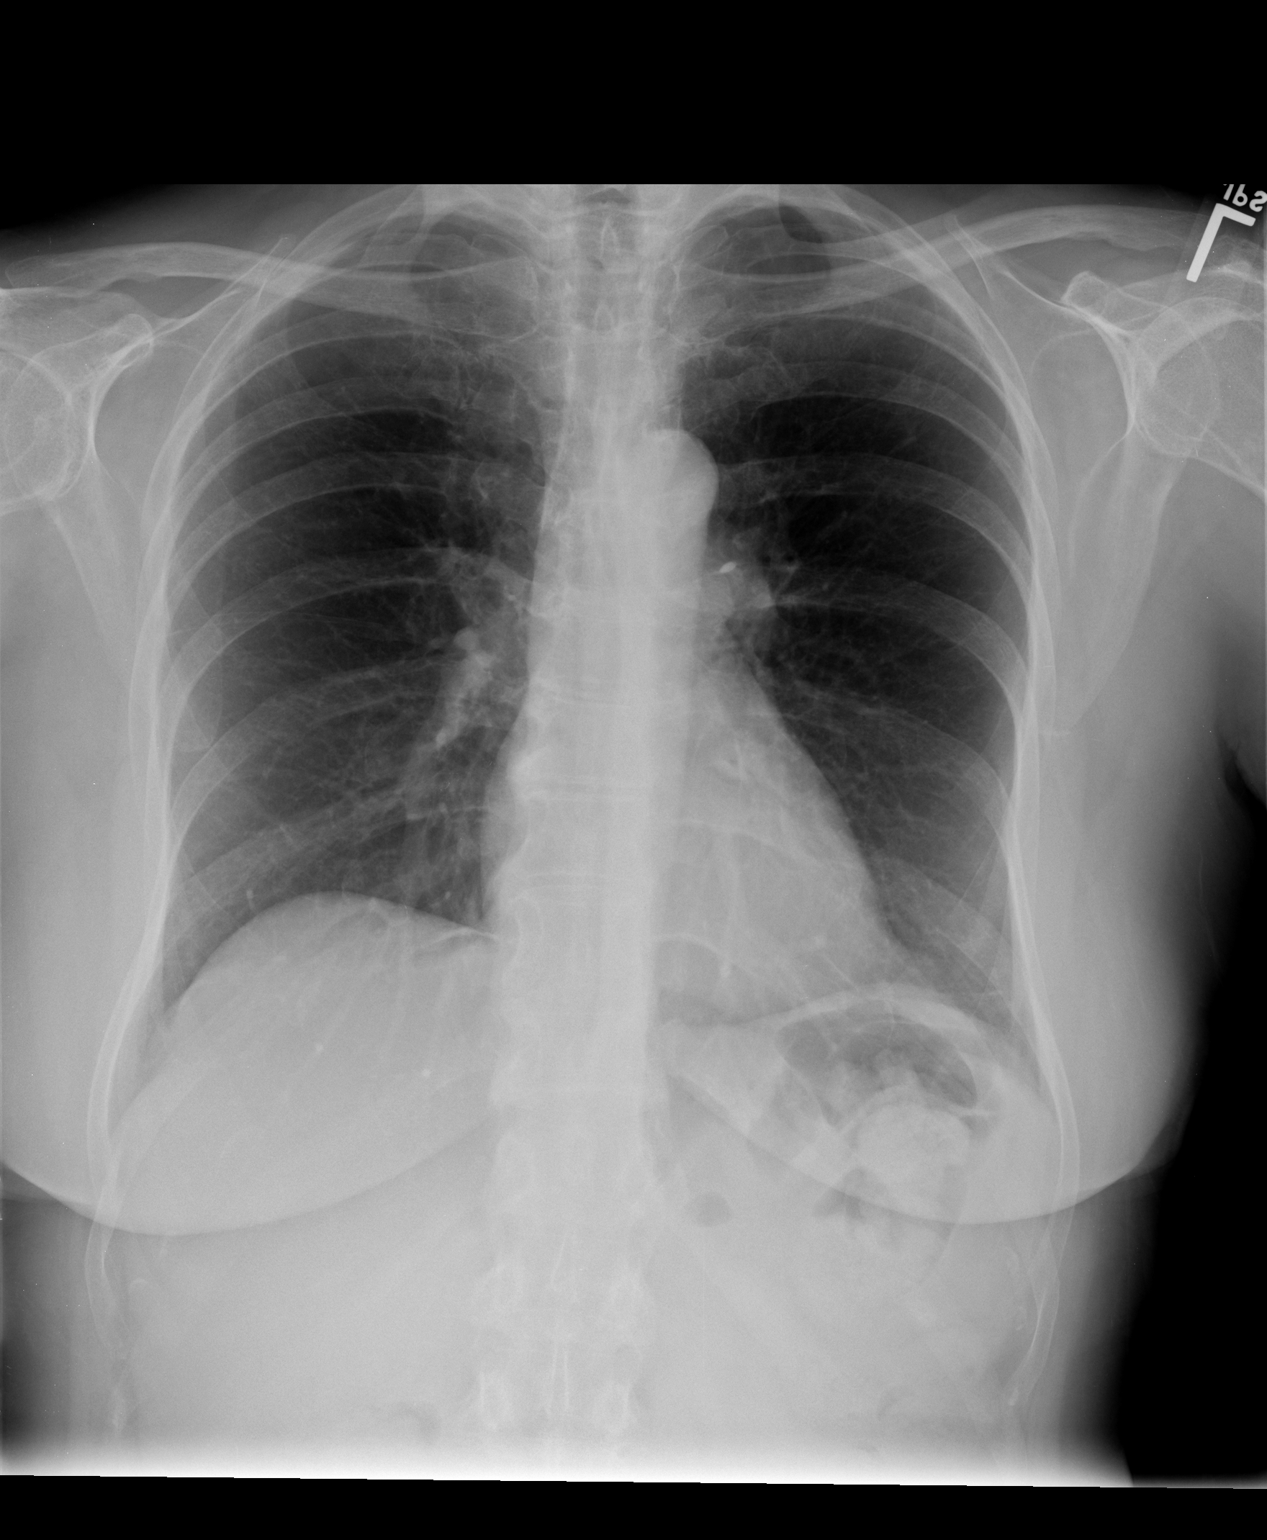

[view not recorded (2 of 2)]
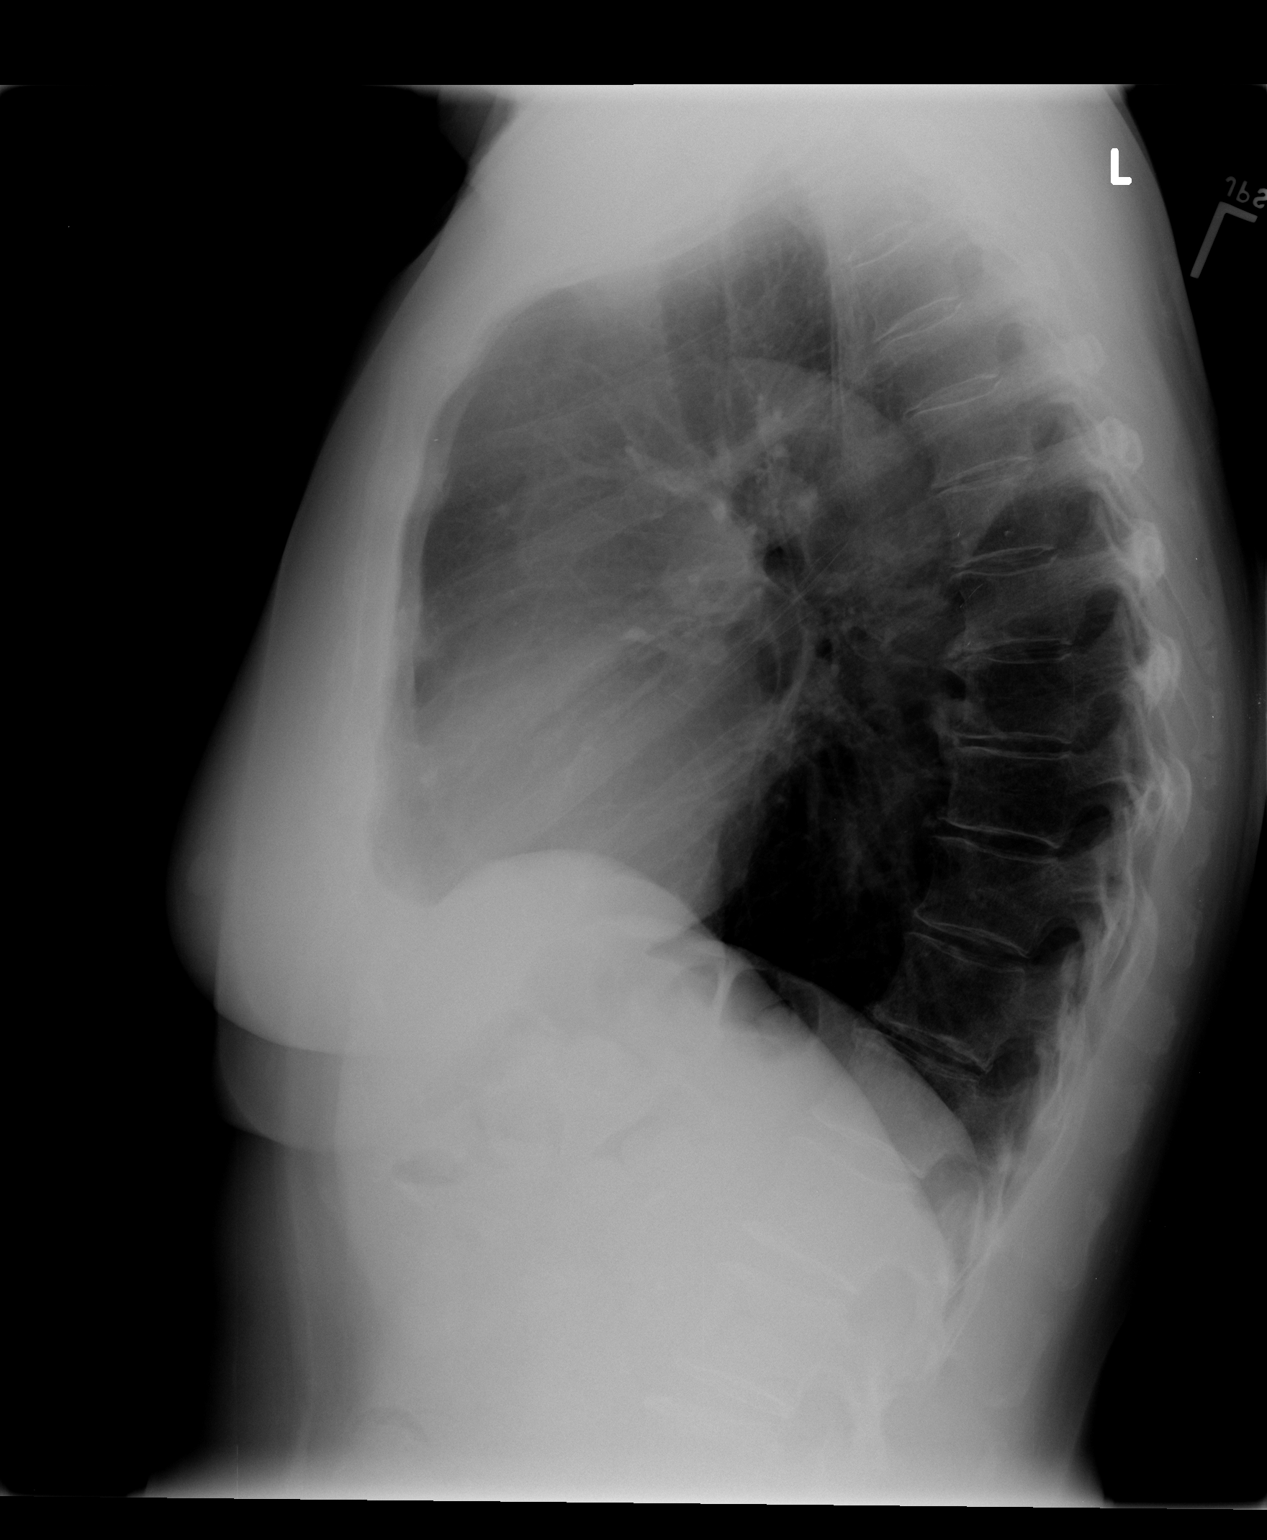

[2 of 2 positions shown; findings below may reference images not displayed]

FINDINGS: Heart and mediastinal contours are within normal limits.
The lung fields appear clear with no signs of focal infiltrate or
congestive failure.  No pleural fluid or significant peribronchial
cuffing is seen.

Bony structures demonstrate mild degenerative change with
osteophytosis of the mid thoracic spine and are otherwise intact.
IMPRESSION: Stable cardiopulmonary appearance with no new focal or acute
abnormality identified

## 2013-10-10 ENCOUNTER — Encounter: Payer: Self-pay | Admitting: Emergency Medicine

## 2013-10-20 ENCOUNTER — Encounter: Payer: Self-pay | Admitting: Emergency Medicine

## 2013-10-20 ENCOUNTER — Ambulatory Visit (INDEPENDENT_AMBULATORY_CARE_PROVIDER_SITE_OTHER): Payer: Medicare Other | Admitting: Emergency Medicine

## 2013-10-20 VITALS — BP 104/66 | HR 72 | Temp 98.0°F | Resp 16 | Ht 64.0 in | Wt 142.0 lb

## 2013-10-20 DIAGNOSIS — R5381 Other malaise: Secondary | ICD-10-CM

## 2013-10-20 DIAGNOSIS — I10 Essential (primary) hypertension: Secondary | ICD-10-CM

## 2013-10-20 DIAGNOSIS — E039 Hypothyroidism, unspecified: Secondary | ICD-10-CM

## 2013-10-20 DIAGNOSIS — Z0000 Encounter for general adult medical examination without abnormal findings: Secondary | ICD-10-CM

## 2013-10-20 DIAGNOSIS — E559 Vitamin D deficiency, unspecified: Secondary | ICD-10-CM

## 2013-10-20 DIAGNOSIS — E782 Mixed hyperlipidemia: Secondary | ICD-10-CM

## 2013-10-20 DIAGNOSIS — R5383 Other fatigue: Secondary | ICD-10-CM

## 2013-10-20 DIAGNOSIS — E119 Type 2 diabetes mellitus without complications: Secondary | ICD-10-CM

## 2013-10-20 DIAGNOSIS — R239 Unspecified skin changes: Secondary | ICD-10-CM

## 2013-10-20 LAB — CBC WITH DIFFERENTIAL/PLATELET
Basophils Absolute: 0.1 10*3/uL (ref 0.0–0.1)
Basophils Relative: 1 % (ref 0–1)
EOS ABS: 0.2 10*3/uL (ref 0.0–0.7)
EOS PCT: 2 % (ref 0–5)
HEMATOCRIT: 40.2 % (ref 36.0–46.0)
HEMOGLOBIN: 13.3 g/dL (ref 12.0–15.0)
LYMPHS ABS: 2.1 10*3/uL (ref 0.7–4.0)
Lymphocytes Relative: 27 % (ref 12–46)
MCH: 28.3 pg (ref 26.0–34.0)
MCHC: 33.1 g/dL (ref 30.0–36.0)
MCV: 85.5 fL (ref 78.0–100.0)
MONO ABS: 0.5 10*3/uL (ref 0.1–1.0)
MONOS PCT: 6 % (ref 3–12)
Neutro Abs: 4.9 10*3/uL (ref 1.7–7.7)
Neutrophils Relative %: 64 % (ref 43–77)
Platelets: 381 10*3/uL (ref 150–400)
RBC: 4.7 MIL/uL (ref 3.87–5.11)
RDW: 14 % (ref 11.5–15.5)
WBC: 7.6 10*3/uL (ref 4.0–10.5)

## 2013-10-20 LAB — HEMOGLOBIN A1C
Hgb A1c MFr Bld: 6.9 % — ABNORMAL HIGH (ref <5.7)
Mean Plasma Glucose: 151 mg/dL — ABNORMAL HIGH (ref <117)

## 2013-10-20 MED ORDER — TRIAZOLAM 0.125 MG PO TABS: 0.1250 mg | ORAL_TABLET | Freq: Every evening | ORAL | Status: DC | PRN

## 2013-10-20 MED ORDER — TRAMADOL HCL 50 MG PO TABS: 50.0000 mg | ORAL_TABLET | Freq: Four times a day (QID) | ORAL | Status: DC | PRN

## 2013-10-20 MED ORDER — DOXYCYCLINE HYCLATE 100 MG PO CAPS: 100.0000 mg | ORAL_CAPSULE | Freq: Two times a day (BID) | ORAL | Status: DC

## 2013-10-20 MED ORDER — METFORMIN HCL 500 MG PO TABS: 1000.0000 mg | ORAL_TABLET | Freq: Two times a day (BID) | ORAL | Status: DC

## 2013-10-20 MED ORDER — ATORVASTATIN CALCIUM 10 MG PO TABS: 10.0000 mg | ORAL_TABLET | ORAL | Status: DC

## 2013-10-20 MED ORDER — AMLODIPINE BESYLATE 5 MG PO TABS: 5.0000 mg | ORAL_TABLET | Freq: Every day | ORAL | Status: DC

## 2013-10-20 MED ORDER — LISINOPRIL-HYDROCHLOROTHIAZIDE 20-25 MG PO TABS: 1.0000 | ORAL_TABLET | Freq: Every day | ORAL | Status: DC

## 2013-10-20 MED ORDER — HYDROCOD POLST-CHLORPHEN POLST 10-8 MG/5ML PO LQCR: 5.0000 mL | Freq: Two times a day (BID) | ORAL | Status: DC | PRN

## 2013-10-20 MED ORDER — LEVOTHYROXINE SODIUM 50 MCG PO TABS: 50.0000 ug | ORAL_TABLET | Freq: Every day | ORAL | Status: DC

## 2013-10-20 NOTE — Patient Instructions (Signed)
FYI and CALL YOUR DERM for appointment  Melanoma Melanoma is a form of skin cancer that begins in melanocytes. Melanocytes are the skin cells that produce pigment. Melanoma starts as a mole on the skin and can spread to other parts of the body. If found early, many cases of melanoma are curable.  CAUSES  The exact cause is unknown.  RISK FACTORS  Spending a lot of time in the sun, under a sunlamp, or in a tanning booth.  Having sunburn that blisters. The more blistering sunburns you have, the higher your risk of melanoma.  Spending time in parts of the world with more intense sunlight.  Living in a hot, sunny climate.  Having fair skin that does not tan easily.  Having had melanoma before.  Having a family history of melanoma.  Having more than 100 skin moles. SIGNS AND SYMPTOMS   ABCDE changes in a mole. ABCDE stands for:   Asymmetry. This means the mole has an irregular shape. It is not round or oval.  Border. This means the mole has an irregular or bumpy border.  Color. This means the mole has multiple colors in it, including brown, black, blue, red, or tan.  Diameter. This means the mole is more than 0.2 in (6 mm) across.  Evolving. This refers to any unusual changes or symptoms in the mole, such as pain, itching, stinging, sensitivity, or bleeding.  A new mole.  Swollen lymph nodes.  Shortness of breath.  Bone pain.  Headache.  Seizures.  Visual problems. DIAGNOSIS  Your health care provider will take a tissue sample from the mole to examine under a microscope (biopsy). The biopsy will reveal whether melanoma has spread to deeper layers of the skin. Your health care provider will also order tests, including:  Blood tests.  Chest X-rays.  A CT scan.  A bone scan. TREATMENT  You will have surgery to remove the cancer. Your lymph nodes may also be removed during the surgery. If melanoma has spread to other organs, such as the liver, lungs, bone, or  brain, you will need additional treatment.  PREVENTION  Melanoma may come back (recur) after it has been treated. Your risk of recurrence is higher if you had thick or ulcerated tumors or patches of tumors. Generally, the more advanced your melanoma, the more likely it will recur. You can help prevent melanoma from recurring by staying out of the sun, especially during peak midafternoon hours. When you are outdoors or in the sun:  Wear long sleeves, a hat, and sunglasses that block UV light when possible.  Apply a sunscreen with an SPF of 30 or higher regularly. Take these precautions on cloudy days and in the winter, even if you will be outdoors for only a short period of time. SEEK MEDICAL CARE IF:  You notice any new growths or changes in your skin.  You have had melanoma removed and you notice a new growth in the same location. Document Released: 03/09/2005 Document Revised: 07/24/2013 Document Reviewed: 05/03/2013 [facility] Patient Information 2015 [place], [place]. This information is not intended to replace advice given to you by your health care provider. Make sure you discuss any questions you have with your health care provider.

## 2013-10-20 NOTE — Progress Notes (Addendum)
Subjective:    Patient ID: Elizabeth May, female    DOB: [birth year], 68 y.o.   MRN: [id number]  HPI Comments: 68 yo WF CPE and presents for 3 month F/U for HTN, Cholesterol, DM, D. deficient  She notes BS 120-130s, BP good. She has been exercising routinely with GYM and hiking. She checks feet routinely and denies skin break down or neuropathy increase.   She notes she is overall doing well. She has mild fatigue which usually resolves with rest. Thyroid has been stable.    She would like refill of cough medicine last bottle was prescribed 2012. She rarely uses Tramadol last refill9/2014. Halcion she mainly uses for travel and sleep. She takes Doxycycline for skin infection/ acne recurrent/ PRN  WBC             6.6   07/03/2013 HGB            13.7   07/03/2013 HCT            40.4   07/03/2013 PLT             421   07/03/2013 GLUCOSE         110   07/03/2013 CHOL            148   07/03/2013 TRIG            154   07/03/2013 HDL              48   07/03/2013 LDLCALC          69   07/03/2013 ALT              11   07/03/2013 AST              16   07/03/2013 NA              140   07/03/2013 K               4.3   07/03/2013 CL              101   07/03/2013 CREATININE     0.95   07/03/2013 BUN              24   07/03/2013 CO2              25   07/03/2013 TSH           2.123   04/04/2013 INR            0.91   10/16/2011 HGBA1C          7.3   07/03/2013   Hyperlipidemia  Hypertension  Diabetes     Medication List       This list is accurate as of: 10/20/13 11:59 PM.  Always use your most recent med list.               amLODipine 5 MG tablet  Commonly known as:  NORVASC  Take 1 tablet (5 mg total) by mouth daily.     atorvastatin 10 MG tablet  Commonly known as:  LIPITOR  Take 1 tablet (10 mg total) by mouth every other day.     Biotin 10 MG Tabs  Take by mouth daily.     calcium-vitamin D 500-200 MG-UNIT per tablet  Take 1 tablet by mouth 2 (two) times daily with a meal.      chlorpheniramine-HYDROcodone 10-8 MG/5ML Lqcr  Commonly known as:  TUSSIONEX  Take 5 mLs by mouth 2 (two) times daily as needed for cough.     CINNAMON PO  Take 1 capsule by mouth daily. Not taking on a regular basis     doxycycline 100 MG capsule  Commonly known as:  VIBRAMYCIN  Take 1 capsule (100 mg total) by mouth 2 (two) times daily.     Fish Oil 1000 MG Caps  Take by mouth daily.     levothyroxine 50 MCG tablet  Commonly known as:  SYNTHROID, LEVOTHROID  Take 1 tablet (50 mcg total) by mouth daily.     lisinopril-hydrochlorothiazide 20-25 MG per tablet  Commonly known as:  PRINZIDE,ZESTORETIC  Take 1 tablet by mouth daily.     Magnesium 250 MG Tabs  Take 250 mg by mouth daily.     metFORMIN 500 MG tablet  Commonly known as:  GLUCOPHAGE  Take 2 tablets (1,000 mg total) by mouth 2 (two) times daily with a meal.     promethazine-codeine 6.25-10 MG/5ML syrup  Commonly known as:  PHENERGAN with CODEINE  1 t 2 teaspoons every 3 to 4 hours as needed for severe cough     traMADol 50 MG tablet  Commonly known as:  ULTRAM  Take 1 tablet (50 mg total) by mouth every 6 (six) hours as needed.     triazolam 0.125 MG tablet  Commonly known as:  HALCION  Take 1 tablet (0.125 mg total) by mouth at bedtime as needed for sleep.       No Known Allergies   Past Medical History  Diagnosis Date  . Hypertension   . Diabetes mellitus   . Headache(784.0)     hx migraines  . Hypothyroidism   . Hyperlipidemia    Past Surgical History  Procedure Laterality Date  . Breast surgery  80's    lft cyst  . Anterior cervical decomp/discectomy fusion  10/22/2011    Procedure: ANTERIOR CERVICAL DECOMPRESSION/DISCECTOMY FUSION 1 LEVEL;  Surgeon: Emilee HeroMark Leonard Dumonski, MD;  [facility];  Service: Orthopedics;  Laterality: Right;  Anterior cervical decompression fusion, cervical 5-6 with instrumentation and allograft.   History  Substance Use Topics  . Smoking status: Never Smoker   .  Smokeless tobacco: Not on file     Comment: occ social  . Alcohol Use: 3.5 oz/week    7 drink(s) per week   Family History  Problem Relation Age of Onset  . Hypertension Mother   . Hyperlipidemia Mother   . Heart disease Mother   . Drug abuse Maternal Grandmother    Patient Care Team: Lucky CowboyWilliam McKeown, MD as PCP - General (Internal Medicine) Kathleene Hazelhristopher D McAlhany, MD as Consulting Physician (Cardiology) Charna ElizabethJyothi Mann, MD as Consulting Physician (Gastroenterology) Emilee HeroMark Leonard Dumonski, MD as Consulting Physician (Orthopedic Surgery) Freddy FinnerW Ronald Neal, MD as Consulting Physician (Obstetrics and Gynecology)  Preventative care:  Last colonoscopy: 2006  Last [facility]: 2012 [facility] Last pap smear/pelvic exam: 8/ 2014  DEXA:2013 borderline osteopenia  EYE: 2013  Dentist: Q 6 month  Prior vaccinations:  TD or Tdap: 2012  Influenza: 2014  Pneumococcal: 2010 decline update due to upcoming travel Shingles/Zostavax: 2013   Review of Systems  All other systems reviewed and are negative.  BP 104/66  Pulse 72  Temp(Src) 98 F (36.7 C) (Temporal)  Resp 16  Ht 5\' 4"  (1.626 m)  Wt 142 lb (64.411 kg)  BMI 24.36 kg/m2     Objective:   Physical Exam  Nursing  note and vitals reviewed. Constitutional: She is oriented to person, place, and time. She appears well-developed and well-nourished. No distress.  HENT:  Head: Normocephalic and atraumatic.  Right Ear: External ear normal.  Left Ear: External ear normal.  Nose: Nose normal.  Mouth/Throat: Oropharynx is clear and moist.  Eyes: Conjunctivae and EOM are normal. Pupils are equal, round, and reactive to light. Right eye exhibits no discharge. Left eye exhibits no discharge. No scleral icterus.  Neck: Normal range of motion. Neck supple. No JVD present. No tracheal deviation present. No thyromegaly present.  Cardiovascular: Normal rate, regular rhythm, normal heart sounds and intact distal pulses.    Pulmonary/Chest: Effort normal and breath sounds normal.  Abdominal: Soft. Bowel sounds are normal. She exhibits no distension and no mass. There is no tenderness. There is no rebound and no guarding.  Genitourinary:  Def gyn  Musculoskeletal: Normal range of motion. She exhibits no edema and no tenderness.  Lymphadenopathy:    She has no cervical adenopathy.  Neurological: She is alert and oriented to person, place, and time. She has normal reflexes. No cranial nerve deficit. She exhibits normal muscle tone. Coordination normal.  Skin: Skin is warm and dry. No rash noted. No erythema. No pallor.  Left shoulder/ right mid/ low back flat dark irregular  Psychiatric: She has a normal mood and affect. Her behavior is normal. Judgment and thought content normal.      AORTA SCAN WNL EKG NSCSPT     Assessment & Plan:  1. CPE- Update screening labs/ History/ Immunizations/ Testing as needed. Advised healthy diet, QD exercise, increase H20 and continue RX/ Vitamins AD.  2.  3 month F/U for HTN, Cholesterol,DM, D. Deficient. Needs healthy diet, cardio QD and obtain healthy weight. Check Labs, Check BP if >130/80 call office, Check BS if >200 call office. ADvise can decrease  Lisinopril to 1/2 if BP <110/60 on 1/2 can d/c and continue to monitor/ call with any concerns  3. Fatigue- check labs, increase activity and H2O  4.  Irreg Nevi- monitor for any change, schedule for removal

## 2013-10-21 LAB — URINALYSIS, ROUTINE W REFLEX MICROSCOPIC
Bilirubin Urine: NEGATIVE
GLUCOSE, UA: NEGATIVE mg/dL
HGB URINE DIPSTICK: NEGATIVE
KETONES UR: NEGATIVE mg/dL
Leukocytes, UA: NEGATIVE
Nitrite: NEGATIVE
PROTEIN: NEGATIVE mg/dL
Specific Gravity, Urine: 1.024 (ref 1.005–1.030)
UROBILINOGEN UA: 0.2 mg/dL (ref 0.0–1.0)
pH: 5.5 (ref 5.0–8.0)

## 2013-10-21 LAB — BASIC METABOLIC PANEL WITH GFR
BUN: 20 mg/dL (ref 6–23)
CHLORIDE: 103 meq/L (ref 96–112)
CO2: 24 meq/L (ref 19–32)
CREATININE: 1.13 mg/dL — AB (ref 0.50–1.10)
Calcium: 9.5 mg/dL (ref 8.4–10.5)
GFR, EST NON AFRICAN AMERICAN: 51 mL/min — AB
GFR, Est African American: 59 mL/min — ABNORMAL LOW
GLUCOSE: 113 mg/dL — AB (ref 70–99)
Potassium: 4.9 mEq/L (ref 3.5–5.3)
Sodium: 141 mEq/L (ref 135–145)

## 2013-10-21 LAB — HEPATIC FUNCTION PANEL
ALT: 11 U/L (ref 0–35)
AST: 17 U/L (ref 0–37)
Albumin: 4.3 g/dL (ref 3.5–5.2)
Alkaline Phosphatase: 60 U/L (ref 39–117)
BILIRUBIN DIRECT: 0.1 mg/dL (ref 0.0–0.3)
Indirect Bilirubin: 0.3 mg/dL (ref 0.2–1.2)
Total Bilirubin: 0.4 mg/dL (ref 0.2–1.2)
Total Protein: 6.4 g/dL (ref 6.0–8.3)

## 2013-10-21 LAB — LIPID PANEL
CHOLESTEROL: 128 mg/dL (ref 0–200)
HDL: 53 mg/dL (ref >39)
LDL Cholesterol: 51 mg/dL (ref 0–99)
TRIGLYCERIDES: 122 mg/dL (ref <150)
Total CHOL/HDL Ratio: 2.4 Ratio
VLDL: 24 mg/dL (ref 0–40)

## 2013-10-21 LAB — TSH: TSH: 1.791 u[IU]/mL (ref 0.350–4.500)

## 2013-10-21 LAB — MICROALBUMIN / CREATININE URINE RATIO
Creatinine, Urine: 220 mg/dL
Microalb Creat Ratio: 7.6 mg/g (ref 0.0–30.0)
Microalb, Ur: 1.67 mg/dL (ref 0.00–1.89)

## 2013-10-21 LAB — MAGNESIUM: Magnesium: 1.9 mg/dL (ref 1.5–2.5)

## 2013-10-21 LAB — INSULIN, FASTING: Insulin fasting, serum: 8 u[IU]/mL (ref 3–28)

## 2013-10-21 LAB — VITAMIN D 25 HYDROXY (VIT D DEFICIENCY, FRACTURES): VIT D 25 HYDROXY: 71 ng/mL (ref 30–89)

## 2013-10-24 ENCOUNTER — Telehealth: Payer: Self-pay

## 2013-10-24 NOTE — Telephone Encounter (Signed)
Patient aware to take 1/2 Lisinopril.

## 2013-10-24 NOTE — Telephone Encounter (Signed)
Message copied by Joya MartyrZMENT, Nikholas Geffre M on Tue Oct 24, 2013 10:19 AM ------      Message from: Loree FeeSMITH, MELISSA R      Created: Mon Oct 23, 2013 12:38 PM       ADvise can decrease  Lisinopril to 1/2 if BP <110/60 on 1/2 can d/c and continue to monitor/ call with any concerns ------

## 2013-10-26 ENCOUNTER — Encounter: Payer: Self-pay | Admitting: Physician Assistant

## 2013-10-30 ENCOUNTER — Other Ambulatory Visit: Payer: Self-pay | Admitting: *Deleted

## 2013-10-30 MED ORDER — ATORVASTATIN CALCIUM 10 MG PO TABS
10.0000 mg | ORAL_TABLET | Freq: Every day | ORAL | Status: DC
Start: 2013-10-30 — End: 2014-03-08

## 2014-03-06 ENCOUNTER — Other Ambulatory Visit: Payer: Self-pay | Admitting: Internal Medicine

## 2014-03-08 ENCOUNTER — Encounter: Payer: Self-pay | Admitting: Internal Medicine

## 2014-03-08 ENCOUNTER — Ambulatory Visit (INDEPENDENT_AMBULATORY_CARE_PROVIDER_SITE_OTHER): Payer: Medicare Other | Admitting: Internal Medicine

## 2014-03-08 VITALS — BP 106/66 | HR 64 | Temp 98.1°F | Resp 16 | Ht 64.0 in | Wt 141.6 lb

## 2014-03-08 DIAGNOSIS — E782 Mixed hyperlipidemia: Secondary | ICD-10-CM

## 2014-03-08 DIAGNOSIS — E039 Hypothyroidism, unspecified: Secondary | ICD-10-CM

## 2014-03-08 DIAGNOSIS — G894 Chronic pain syndrome: Secondary | ICD-10-CM

## 2014-03-08 DIAGNOSIS — E1122 Type 2 diabetes mellitus with diabetic chronic kidney disease: Secondary | ICD-10-CM

## 2014-03-08 DIAGNOSIS — E559 Vitamin D deficiency, unspecified: Secondary | ICD-10-CM

## 2014-03-08 DIAGNOSIS — Z79899 Other long term (current) drug therapy: Secondary | ICD-10-CM | POA: Insufficient documentation

## 2014-03-08 DIAGNOSIS — I10 Essential (primary) hypertension: Secondary | ICD-10-CM

## 2014-03-08 LAB — CBC WITH DIFFERENTIAL/PLATELET
BASOS ABS: 0 10*3/uL (ref 0.0–0.1)
Basophils Relative: 0 % (ref 0–1)
EOS ABS: 0.1 10*3/uL (ref 0.0–0.7)
Eosinophils Relative: 2 % (ref 0–5)
HCT: 40.9 % (ref 36.0–46.0)
HEMOGLOBIN: 13.2 g/dL (ref 12.0–15.0)
LYMPHS ABS: 2 10*3/uL (ref 0.7–4.0)
Lymphocytes Relative: 28 % (ref 12–46)
MCH: 28.1 pg (ref 26.0–34.0)
MCHC: 32.3 g/dL (ref 30.0–36.0)
MCV: 87 fL (ref 78.0–100.0)
MPV: 10.3 fL (ref 9.4–12.4)
Monocytes Absolute: 0.4 10*3/uL (ref 0.1–1.0)
Monocytes Relative: 6 % (ref 3–12)
Neutro Abs: 4.5 10*3/uL (ref 1.7–7.7)
Neutrophils Relative %: 64 % (ref 43–77)
Platelets: 351 10*3/uL (ref 150–400)
RBC: 4.7 MIL/uL (ref 3.87–5.11)
RDW: 13.8 % (ref 11.5–15.5)
WBC: 7.1 10*3/uL (ref 4.0–10.5)

## 2014-03-08 LAB — HEMOGLOBIN A1C
Hgb A1c MFr Bld: 7.3 % — ABNORMAL HIGH (ref <5.7)
Mean Plasma Glucose: 163 mg/dL — ABNORMAL HIGH (ref <117)

## 2014-03-08 MED ORDER — AMLODIPINE BESYLATE 5 MG PO TABS: 5.0000 mg | ORAL_TABLET | Freq: Every day | ORAL | Status: DC

## 2014-03-08 MED ORDER — METFORMIN HCL ER 500 MG PO TB24: ORAL_TABLET | ORAL | Status: DC

## 2014-03-08 MED ORDER — TRAMADOL HCL 50 MG PO TABS: 50.0000 mg | ORAL_TABLET | Freq: Four times a day (QID) | ORAL | Status: DC | PRN

## 2014-03-08 MED ORDER — LISINOPRIL-HYDROCHLOROTHIAZIDE 20-25 MG PO TABS: 1.0000 | ORAL_TABLET | Freq: Every day | ORAL | Status: DC

## 2014-03-08 MED ORDER — ATORVASTATIN CALCIUM 10 MG PO TABS: 10.0000 mg | ORAL_TABLET | Freq: Every day | ORAL | Status: DC

## 2014-03-08 MED ORDER — LEVOTHYROXINE SODIUM 50 MCG PO TABS: 50.0000 ug | ORAL_TABLET | Freq: Every day | ORAL | Status: DC

## 2014-03-08 NOTE — Patient Instructions (Signed)

## 2014-03-08 NOTE — Progress Notes (Signed)
Patient ID: Elizabeth FritzConstance W May, female   DOB: 1946/03/14, 68 y.o.   MRN: [id number]   This very nice 68 y.o.MWF presents for 3 month follow up with Hypertension, Hyperlipidemia, Pre-Diabetes and Vitamin D Deficiency.    Patient is treated for HTN (2007) & BP has been controlled at home. Today's BP: 106/66 mmHg. Patient has had no complaints of any cardiac type chest pain, palpitations, dyspnea/orthopnea/PND, dizziness, claudication, or dependent edema.   Hyperlipidemia is controlled with diet & meds. Patient denies myalgias or other med SE's. Last Lipids were at goal - Total Chol 128; HDL 53; LDL 51; Trig 122 on 10/20/2013.   Also, the patient has history of T2_NIDDM w/Stage 2 CKD (GFR 51 ml/min)  and has had no symptoms of reactive hypoglycemia, diabetic polys, paresthesias or visual blurring.  She alleges checking FBG's 3 x week and which range 90-140 mg%. Last A1c was  6.9% on 10/20/2013.   Further, the patient also has history of Vitamin D Deficiency and supplements vitamin D without any suspected side-effects. Last vitamin D was  71 on  10/20/2013.   Medication List   atorvastatin 10 MG tablet  Commonly known as:  LIPITOR  Take 1 tablet (10 mg total) by mouth daily.     Biotin 10 MG Tabs  Take by mouth daily.     calcium-vitamin D 500-200 MG-UNIT per tablet  Take 1 tablet by mouth 2 (two) times daily with a meal.     chlorpheniramine-HYDROcodone 10-8 MG/5ML Lqcr  Commonly known as:  TUSSIONEX  Take 5 mLs by mouth 2 (two) times daily as needed for cough.     CINNAMON PO  Take 2 capsules by mouth daily.     doxycycline 100 MG capsule  Commonly known as:  VIBRAMYCIN  Take 1 capsule (100 mg total) by mouth 2 (two) times daily.     Fish Oil 1000 MG Caps  Take by mouth daily.     levothyroxine 50 MCG tablet  Commonly known as:  SYNTHROID, LEVOTHROID  Take 1 tablet (50 mcg total) by mouth daily.     lisinopril-hydrochlorothiazide 20-25 MG per tablet  Commonly known as:   PRINZIDE,ZESTORETIC  Take 1 tablet by mouth daily.     Magnesium 250 MG Tabs  Take 250 mg by mouth daily.     metFORMIN 500 MG 24 hr tablet  Commonly known as:  GLUCOPHAGE XR  Take 2 tablets 2 x daily with a meal for Diabetes     promethazine-codeine 6.25-10 MG/5ML syrup  Commonly known as:  PHENERGAN with CODEINE  1 t 2 teaspoons every 3 to 4 hours as needed for severe cough     traMADol 50 MG tablet  Commonly known as:  ULTRAM  Take 1 tablet (50 mg total) by mouth every 6 (six) hours as needed.     triazolam 0.125 MG tablet  Commonly known as:  HALCION  Take 1 tablet (0.125 mg total) by mouth at bedtime as needed for sleep.     No Known Allergies  PMHx:   Past Medical History  Diagnosis Date  . Hypertension   . Diabetes mellitus   . Headache(784.0)     hx migraines  . Hypothyroidism   . Hyperlipidemia    Immunization History  Administered Date(s) Administered  . Influenza Split 02/13/2013  . Pneumococcal-Unspecified 03/23/2008  . Tdap 07/17/2010  . Zoster 01/14/2012   Past Surgical History  Procedure Laterality Date  . Breast surgery  80's    lft cyst  .  Anterior cervical decomp/discectomy fusion  10/22/2011    Procedure: ANTERIOR CERVICAL DECOMPRESSION/DISCECTOMY FUSION 1 LEVEL;  Surgeon: Emilee HeroMark Leonard Dumonski, MD;  [facility];  Service: Orthopedics;  Laterality: Right;  Anterior cervical decompression fusion, cervical 5-6 with instrumentation and allograft.   FHx:    Reviewed / unchanged  SHx:    Reviewed / unchanged  Systems Review:  Constitutional: Denies fever, chills, wt changes, headaches, insomnia, fatigue, night sweats, change in appetite. Eyes: Denies redness, blurred vision, diplopia, discharge, itchy, watery eyes.  ENT: Denies discharge, congestion, post nasal drip, epistaxis, sore throat, earache, hearing loss, dental pain, tinnitus, vertigo, sinus pain, snoring.  CV: Denies chest pain, palpitations, irregular heartbeat, syncope, dyspnea,  diaphoresis, orthopnea, PND, claudication or edema. Respiratory: denies cough, dyspnea, DOE, pleurisy, hoarseness, laryngitis, wheezing.  Gastrointestinal: Denies dysphagia, odynophagia, heartburn, reflux, water brash, abdominal pain or cramps, nausea, vomiting, bloating, diarrhea, constipation, hematemesis, melena, hematochezia  or hemorrhoids. Genitourinary: Denies dysuria, frequency, urgency, nocturia, hesitancy, discharge, hematuria or flank pain. Musculoskeletal: Denies arthralgias, myalgias, stiffness, jt. swelling, pain, limping or strain/sprain.  Skin: Denies pruritus, rash, hives, warts, acne, eczema or change in skin lesion(s). Neuro: No weakness, tremor, incoordination, spasms, paresthesia or pain. Psychiatric: Denies confusion, memory loss or sensory loss. Endo: Denies change in weight, skin or hair change.  Heme/Lymph: No excessive bleeding, bruising or enlarged lymph nodes.  Physical Exam  BP 106/66   Pulse 64  Temp98.1 F   Resp 16  Ht 5\' 4"    Wt 141 lb 9.6 oz           BMI 24.29  Appears well nourished and in no distress. Eyes: PERRLA, EOMs, conjunctiva no swelling or erythema. Sinuses: No frontal/maxillary tenderness ENT/Mouth: EAC's clear, TM's nl w/o erythema, bulging. Nares clear w/o erythema, swelling, exudates. Oropharynx clear without erythema or exudates. Oral hygiene is good. Tongue normal, non obstructing. Hearing intact.  Neck: Supple. Thyroid nl. Car 2+/2+ without bruits, nodes or JVD. Chest: Respirations nl with BS clear & equal w/o rales, rhonchi, wheezing or stridor.  Cor: Heart sounds normal w/ regular rate and rhythm without sig. murmurs, gallops, clicks, or rubs. Peripheral pulses normal and equal  without edema.  Abdomen: Soft & bowel sounds normal. Non-tender w/o guarding, rebound, hernias, masses, or organomegaly.  Lymphatics: Unremarkable.  Musculoskeletal: Full ROM all peripheral extremities, joint stability, 5/5 strength, and normal gait.  Skin:  Warm, dry without exposed rashes, lesions or ecchymosis apparent.  Neuro: Cranial nerves intact, reflexes equal bilaterally. Sensory-motor testing grossly intact. Tendon reflexes grossly intact.  Pysch: Alert & oriented x 3.  Insight and judgement nl & appropriate. No ideations.  Assessment and Plan:  1. Hypertension - Continue monitor blood pressure at home. Continue diet/meds same.  2. Hyperlipidemia - Continue diet/meds, exercise,& lifestyle modifications. Continue monitor periodic cholesterol/liver & renal functions   3. T2_NIDDM w/CKD2 - Continue diet, exercise, lifestyle modifications. Monitor appropriate labs.  4. Vitamin D Deficiency - Continue supplementation.   Recommended regular exercise, BP monitoring, weight control, and discussed med and SE's. Recommended labs to assess and monitor clinical status. Further disposition pending results of labs.

## 2014-03-09 LAB — LIPID PANEL
CHOL/HDL RATIO: 2.1 ratio
Cholesterol: 117 mg/dL (ref 0–200)
HDL: 55 mg/dL (ref >39)
LDL CALC: 43 mg/dL (ref 0–99)
TRIGLYCERIDES: 95 mg/dL (ref <150)
VLDL: 19 mg/dL (ref 0–40)

## 2014-03-09 LAB — VITAMIN D 25 HYDROXY (VIT D DEFICIENCY, FRACTURES): Vit D, 25-Hydroxy: 49 ng/mL (ref 30–100)

## 2014-03-09 LAB — BASIC METABOLIC PANEL WITH GFR
BUN: 21 mg/dL (ref 6–23)
CHLORIDE: 101 meq/L (ref 96–112)
CO2: 25 mEq/L (ref 19–32)
Calcium: 9.9 mg/dL (ref 8.4–10.5)
Creat: 1.14 mg/dL — ABNORMAL HIGH (ref 0.50–1.10)
GFR, EST NON AFRICAN AMERICAN: 50 mL/min — AB
GFR, Est African American: 58 mL/min — ABNORMAL LOW
Glucose, Bld: 140 mg/dL — ABNORMAL HIGH (ref 70–99)
POTASSIUM: 4.5 meq/L (ref 3.5–5.3)
Sodium: 141 mEq/L (ref 135–145)

## 2014-03-09 LAB — HEPATIC FUNCTION PANEL
ALK PHOS: 69 U/L (ref 39–117)
ALT: 13 U/L (ref 0–35)
AST: 14 U/L (ref 0–37)
Albumin: 4.3 g/dL (ref 3.5–5.2)
BILIRUBIN DIRECT: 0.1 mg/dL (ref 0.0–0.3)
BILIRUBIN INDIRECT: 0.3 mg/dL (ref 0.2–1.2)
BILIRUBIN TOTAL: 0.4 mg/dL (ref 0.2–1.2)
Total Protein: 6.6 g/dL (ref 6.0–8.3)

## 2014-03-09 LAB — INSULIN, FASTING: INSULIN FASTING, SERUM: 3.9 u[IU]/mL (ref 2.0–19.6)

## 2014-03-09 LAB — MAGNESIUM: Magnesium: 1.9 mg/dL (ref 1.5–2.5)

## 2014-03-09 LAB — TSH: TSH: 1.909 u[IU]/mL (ref 0.350–4.500)

## 2014-03-15 ENCOUNTER — Other Ambulatory Visit: Payer: Self-pay | Admitting: Internal Medicine

## 2014-06-21 ENCOUNTER — Ambulatory Visit: Payer: Self-pay | Admitting: Physician Assistant

## 2014-06-27 ENCOUNTER — Ambulatory Visit (INDEPENDENT_AMBULATORY_CARE_PROVIDER_SITE_OTHER): Payer: Medicare Other | Admitting: Internal Medicine

## 2014-06-27 ENCOUNTER — Encounter: Payer: Self-pay | Admitting: Internal Medicine

## 2014-06-27 ENCOUNTER — Ambulatory Visit: Payer: Self-pay | Admitting: Physician Assistant

## 2014-06-27 VITALS — BP 162/80 | HR 72 | Temp 98.2°F | Resp 16 | Ht 64.0 in | Wt 140.0 lb

## 2014-06-27 DIAGNOSIS — Z79899 Other long term (current) drug therapy: Secondary | ICD-10-CM

## 2014-06-27 DIAGNOSIS — E1122 Type 2 diabetes mellitus with diabetic chronic kidney disease: Secondary | ICD-10-CM

## 2014-06-27 DIAGNOSIS — E782 Mixed hyperlipidemia: Secondary | ICD-10-CM

## 2014-06-27 DIAGNOSIS — I10 Essential (primary) hypertension: Secondary | ICD-10-CM

## 2014-06-27 DIAGNOSIS — E039 Hypothyroidism, unspecified: Secondary | ICD-10-CM

## 2014-06-27 DIAGNOSIS — E559 Vitamin D deficiency, unspecified: Secondary | ICD-10-CM

## 2014-06-27 LAB — CBC WITH DIFFERENTIAL/PLATELET
BASOS ABS: 0.1 10*3/uL (ref 0.0–0.1)
Basophils Relative: 1 % (ref 0–1)
EOS ABS: 0.1 10*3/uL (ref 0.0–0.7)
Eosinophils Relative: 1 % (ref 0–5)
HCT: 43.4 % (ref 36.0–46.0)
Hemoglobin: 14.4 g/dL (ref 12.0–15.0)
LYMPHS PCT: 28 % (ref 12–46)
Lymphs Abs: 2.5 10*3/uL (ref 0.7–4.0)
MCH: 28.3 pg (ref 26.0–34.0)
MCHC: 33.2 g/dL (ref 30.0–36.0)
MCV: 85.3 fL (ref 78.0–100.0)
MPV: 9.9 fL (ref 8.6–12.4)
Monocytes Absolute: 0.5 10*3/uL (ref 0.1–1.0)
Monocytes Relative: 6 % (ref 3–12)
Neutro Abs: 5.6 10*3/uL (ref 1.7–7.7)
Neutrophils Relative %: 64 % (ref 43–77)
PLATELETS: 423 10*3/uL — AB (ref 150–400)
RBC: 5.09 MIL/uL (ref 3.87–5.11)
RDW: 14.1 % (ref 11.5–15.5)
WBC: 8.8 10*3/uL (ref 4.0–10.5)

## 2014-06-27 LAB — HEMOGLOBIN A1C
HEMOGLOBIN A1C: 7.6 % — AB (ref <5.7)
MEAN PLASMA GLUCOSE: 171 mg/dL — AB (ref <117)

## 2014-06-27 NOTE — Progress Notes (Signed)
Patient ID: Elizabeth May May, female   DOB: 04-24-1945, 69 y.o.   MRN: [id number]  Assessment and Plan:  Hypertension:  -Continue medication -monitor blood pressure at home. -Continue DASH diet -Reminder to go to the ER if any CP, SOB, nausea, dizziness, severe HA, changes vision/speech, left arm numbness and tingling and jaw pain. -BP elevated today.  If remains elevated after 2 weeks of BP log then we need to increase prinzide back to 1 whole tablet.  Patient states understanding.  Cholesterol - Continue diet and exercise -Check cholesterol.   Diabetes with diabetic chronic kidney disease -Continue diet and exercise.  -Check A1C  Vitamin D Def -check level -continue medications.   Continue diet and meds as discussed. Further disposition pending results of labs. Discussed med's effects and SE's.    HPI 69 y.o. female  presents for 3 month follow up with hypertension, hyperlipidemia, diabetes and vitamin D deficiency.   Her blood pressure has not been controlled at home, today their BP is BP: (!) 162/80 mmHg.She does workout.  She goes to the gym 5-6 times per week and does an hour on the treadmill. She denies chest pain, shortness of breath, dizziness.    She is on cholesterol medication and denies myalgias. Her cholesterol is at goal. The cholesterol was:  03/08/2014: Cholesterol, Total 117; HDL-C 55; LDL (calc) 43; Triglycerides 95   She has been working on diet and exercise for diabetes with diabetic chronic kidney disease, she is not on bASA, she is on ACE/ARB, and denies  foot ulcerations, hyperglycemia, hypoglycemia , increased appetite, nausea, paresthesia of the feet, polydipsia, polyuria, visual disturbances, vomiting and weight loss. Last A1C was: 03/08/2014: Hemoglobin-A1c 7.3*   Patient is on Vitamin D supplement. 03/08/2014: VITD 49    Current Medications:  Current Outpatient Prescriptions on File Prior to Visit  Medication Sig Dispense Refill  . amLODipine  (NORVASC) 5 MG tablet Take 1 tablet (5 mg total) by mouth daily. 90 tablet 1  . atorvastatin (LIPITOR) 10 MG tablet Take 1 tablet (10 mg total) by mouth daily. 90 tablet 1  . Biotin 10 MG TABS Take by mouth daily.    . Calcium Carbonate-Vitamin D (CALCIUM-VITAMIN D) 500-200 MG-UNIT per tablet Take 1 tablet by mouth 2 (two) times daily with a meal.    . CINNAMON PO Take 2 capsules by mouth daily.     Marland Kitchen. levothyroxine (SYNTHROID, LEVOTHROID) 50 MCG tablet Take 1 tablet (50 mcg total) by mouth daily. 90 tablet 1  . lisinopril-hydrochlorothiazide (PRINZIDE,ZESTORETIC) 20-25 MG per tablet Take 1 tablet by mouth daily. 90 tablet 1  . Magnesium 250 MG TABS Take 250 mg by mouth daily.    . metFORMIN (GLUCOPHAGE XR) 500 MG 24 hr tablet Take 2 tablets 2 x daily with a meal for Diabetes 360 tablet 1  . Omega-3 Fatty Acids (FISH OIL) 1000 MG CAPS Take by mouth daily.    . traMADol (ULTRAM) 50 MG tablet Take 1 tablet (50 mg total) by mouth every 6 (six) hours as needed. 60 tablet 0   No current facility-administered medications on file prior to visit.   Medical History:  Past Medical History  Diagnosis Date  . Hypertension   . Diabetes mellitus   . Headache(784.0)     hx migraines  . Hypothyroidism   . Hyperlipidemia    Allergies: No Known Allergies   Review of Systems:  Review of Systems  Constitutional: Negative for fever, chills and malaise/fatigue.  HENT: Negative for congestion,  ear pain, sore throat and tinnitus.   Eyes: Negative.   Respiratory: Negative for cough, sputum production, shortness of breath and wheezing.   Cardiovascular: Negative for chest pain, claudication and leg swelling.  Gastrointestinal: Negative for heartburn, nausea, vomiting, abdominal pain, diarrhea, constipation, blood in stool and melena.  Genitourinary: Negative for dysuria, urgency, hematuria and flank pain.  Musculoskeletal: Positive for neck pain.  Skin: Negative.   Neurological: Negative for dizziness,  tremors, loss of consciousness and headaches.    Family history- Review and unchanged  Social history- Review and unchanged  Physical Exam: BP 162/80 mmHg  Pulse 72  Temp(Src) 98.2 F (36.8 C) (Temporal)  Resp 16  Ht  (1.626 m)  Wt 140 lb (63.504 kg)  BMI 24.02 kg/m2 Wt Readings from Last 3 Encounters:  06/27/14 140 lb (63.504 kg)  03/08/14 141 lb 9.6 oz (64.229 kg)  10/20/13 142 lb (64.411 kg)   General Appearance: Well nourished well developed, non-toxic appearing, in no apparent distress. Eyes: PERRLA, EOMs, conjunctiva no swelling or erythema ENT/Mouth: Ear canals clear with no erythema, swelling, or discharge.  TMs normal bilaterally, oropharynx clear, moist, with no exudate.   Neck: Supple, thyroid normal, no JVD, no cervical adenopathy.  Respiratory: Respiratory effort normal, breath sounds clear A&P, no wheeze, rhonchi or rales noted.  No retractions, no accessory muscle usage Cardio: RRR with no MRGs. No noted edema.  Abdomen: Mildly obese, Soft, + BS.  Non tender, no guarding, rebound, hernias, masses. Musculoskeletal: Full ROM, 5/5 strength, Normal gait Skin: Warm, dry without rashes, lesions, ecchymosis.  Neuro: Awake and oriented X 3, Cranial nerves intact. No cerebellar symptoms.  Psych: normal affect, Insight and Judgment appropriate.    FORCUCCI, Tinie Mcgloin, PA-C 10:40 AM [facility]

## 2014-06-27 NOTE — Patient Instructions (Signed)

## 2014-06-28 LAB — TSH: TSH: 2.441 u[IU]/mL (ref 0.350–4.500)

## 2014-06-28 LAB — INSULIN, FASTING: INSULIN FASTING, SERUM: 3.5 u[IU]/mL (ref 2.0–19.6)

## 2014-06-28 LAB — HEPATIC FUNCTION PANEL
ALK PHOS: 63 U/L (ref 39–117)
ALT: 16 U/L (ref 0–35)
AST: 16 U/L (ref 0–37)
Albumin: 4.4 g/dL (ref 3.5–5.2)
BILIRUBIN DIRECT: 0.1 mg/dL (ref 0.0–0.3)
BILIRUBIN INDIRECT: 0.3 mg/dL (ref 0.2–1.2)
BILIRUBIN TOTAL: 0.4 mg/dL (ref 0.2–1.2)
TOTAL PROTEIN: 6.9 g/dL (ref 6.0–8.3)

## 2014-06-28 LAB — BASIC METABOLIC PANEL WITH GFR
BUN: 24 mg/dL — ABNORMAL HIGH (ref 6–23)
CO2: 25 mEq/L (ref 19–32)
Calcium: 9.6 mg/dL (ref 8.4–10.5)
Chloride: 101 mEq/L (ref 96–112)
Creat: 1.11 mg/dL — ABNORMAL HIGH (ref 0.50–1.10)
GFR, EST AFRICAN AMERICAN: 59 mL/min — AB
GFR, Est Non African American: 52 mL/min — ABNORMAL LOW
Glucose, Bld: 132 mg/dL — ABNORMAL HIGH (ref 70–99)
POTASSIUM: 4.9 meq/L (ref 3.5–5.3)
SODIUM: 138 meq/L (ref 135–145)

## 2014-06-28 LAB — LIPID PANEL
CHOLESTEROL: 129 mg/dL (ref 0–200)
HDL: 49 mg/dL (ref >=46)
LDL Cholesterol: 60 mg/dL (ref 0–99)
Total CHOL/HDL Ratio: 2.6 Ratio
Triglycerides: 102 mg/dL (ref <150)
VLDL: 20 mg/dL (ref 0–40)

## 2014-06-28 LAB — VITAMIN D 25 HYDROXY (VIT D DEFICIENCY, FRACTURES): VIT D 25 HYDROXY: 50 ng/mL (ref 30–100)

## 2014-06-28 LAB — MAGNESIUM: Magnesium: 2 mg/dL (ref 1.5–2.5)

## 2014-09-08 ENCOUNTER — Other Ambulatory Visit: Payer: Self-pay | Admitting: Internal Medicine

## 2014-09-26 ENCOUNTER — Ambulatory Visit: Payer: Self-pay | Admitting: Internal Medicine

## 2014-10-12 ENCOUNTER — Other Ambulatory Visit: Payer: Self-pay | Admitting: *Deleted

## 2014-10-12 DIAGNOSIS — E039 Hypothyroidism, unspecified: Secondary | ICD-10-CM

## 2014-10-12 MED ORDER — LEVOTHYROXINE SODIUM 50 MCG PO TABS: 50.0000 ug | ORAL_TABLET | Freq: Every day | ORAL | Status: DC

## 2014-10-18 ENCOUNTER — Other Ambulatory Visit: Payer: Self-pay | Admitting: *Deleted

## 2014-10-18 DIAGNOSIS — E039 Hypothyroidism, unspecified: Secondary | ICD-10-CM

## 2014-10-18 MED ORDER — LEVOTHYROXINE SODIUM 50 MCG PO TABS: 50.0000 ug | ORAL_TABLET | Freq: Every day | ORAL | Status: DC

## 2014-10-22 ENCOUNTER — Encounter: Payer: Self-pay | Admitting: Emergency Medicine

## 2014-10-22 ENCOUNTER — Encounter: Payer: Self-pay | Admitting: Internal Medicine

## 2014-11-07 ENCOUNTER — Other Ambulatory Visit: Payer: Self-pay

## 2014-11-07 DIAGNOSIS — I10 Essential (primary) hypertension: Secondary | ICD-10-CM

## 2014-11-07 MED ORDER — AMLODIPINE BESYLATE 5 MG PO TABS: 5.0000 mg | ORAL_TABLET | Freq: Every day | ORAL | Status: DC

## 2014-11-30 ENCOUNTER — Encounter: Payer: Self-pay | Admitting: Internal Medicine

## 2014-12-07 ENCOUNTER — Encounter: Payer: Self-pay | Admitting: Internal Medicine

## 2014-12-07 ENCOUNTER — Ambulatory Visit (INDEPENDENT_AMBULATORY_CARE_PROVIDER_SITE_OTHER): Payer: Medicare Other | Admitting: Internal Medicine

## 2014-12-07 VITALS — BP 136/62 | HR 68 | Temp 98.6°F | Resp 18 | Ht 63.5 in | Wt 138.0 lb

## 2014-12-07 DIAGNOSIS — D539 Nutritional anemia, unspecified: Secondary | ICD-10-CM | POA: Diagnosis not present

## 2014-12-07 DIAGNOSIS — E039 Hypothyroidism, unspecified: Secondary | ICD-10-CM

## 2014-12-07 DIAGNOSIS — Z1389 Encounter for screening for other disorder: Secondary | ICD-10-CM | POA: Diagnosis not present

## 2014-12-07 DIAGNOSIS — I10 Essential (primary) hypertension: Secondary | ICD-10-CM | POA: Diagnosis not present

## 2014-12-07 DIAGNOSIS — Z9181 History of falling: Secondary | ICD-10-CM

## 2014-12-07 DIAGNOSIS — E559 Vitamin D deficiency, unspecified: Secondary | ICD-10-CM | POA: Diagnosis not present

## 2014-12-07 DIAGNOSIS — R6889 Other general symptoms and signs: Secondary | ICD-10-CM | POA: Diagnosis not present

## 2014-12-07 DIAGNOSIS — N189 Chronic kidney disease, unspecified: Secondary | ICD-10-CM | POA: Diagnosis not present

## 2014-12-07 DIAGNOSIS — Z0001 Encounter for general adult medical examination with abnormal findings: Secondary | ICD-10-CM | POA: Diagnosis not present

## 2014-12-07 DIAGNOSIS — E782 Mixed hyperlipidemia: Secondary | ICD-10-CM | POA: Diagnosis not present

## 2014-12-07 DIAGNOSIS — Z79899 Other long term (current) drug therapy: Secondary | ICD-10-CM

## 2014-12-07 DIAGNOSIS — Z23 Encounter for immunization: Secondary | ICD-10-CM | POA: Diagnosis not present

## 2014-12-07 DIAGNOSIS — M5412 Radiculopathy, cervical region: Secondary | ICD-10-CM

## 2014-12-07 DIAGNOSIS — Z789 Other specified health status: Secondary | ICD-10-CM | POA: Diagnosis not present

## 2014-12-07 DIAGNOSIS — E1122 Type 2 diabetes mellitus with diabetic chronic kidney disease: Secondary | ICD-10-CM | POA: Diagnosis not present

## 2014-12-07 DIAGNOSIS — Z1331 Encounter for screening for depression: Secondary | ICD-10-CM

## 2014-12-07 DIAGNOSIS — Z1212 Encounter for screening for malignant neoplasm of rectum: Secondary | ICD-10-CM

## 2014-12-07 LAB — CBC WITH DIFFERENTIAL/PLATELET
Basophils Absolute: 0.1 10*3/uL (ref 0.0–0.1)
Basophils Relative: 1 % (ref 0–1)
Eosinophils Absolute: 0.2 10*3/uL (ref 0.0–0.7)
Eosinophils Relative: 2 % (ref 0–5)
HEMATOCRIT: 44 % (ref 36.0–46.0)
Hemoglobin: 14.4 g/dL (ref 12.0–15.0)
LYMPHS ABS: 2 10*3/uL (ref 0.7–4.0)
LYMPHS PCT: 25 % (ref 12–46)
MCH: 28.2 pg (ref 26.0–34.0)
MCHC: 32.7 g/dL (ref 30.0–36.0)
MCV: 86.1 fL (ref 78.0–100.0)
MPV: 10 fL (ref 8.6–12.4)
Monocytes Absolute: 0.5 10*3/uL (ref 0.1–1.0)
Monocytes Relative: 6 % (ref 3–12)
NEUTROS PCT: 66 % (ref 43–77)
Neutro Abs: 5.3 10*3/uL (ref 1.7–7.7)
Platelets: 405 10*3/uL — ABNORMAL HIGH (ref 150–400)
RBC: 5.11 MIL/uL (ref 3.87–5.11)
RDW: 13.9 % (ref 11.5–15.5)
WBC: 8.1 10*3/uL (ref 4.0–10.5)

## 2014-12-07 LAB — TSH: TSH: 1.751 u[IU]/mL (ref 0.350–4.500)

## 2014-12-07 LAB — HEPATIC FUNCTION PANEL
ALK PHOS: 65 U/L (ref 33–130)
ALT: 10 U/L (ref 6–29)
AST: 14 U/L (ref 10–35)
Albumin: 4.4 g/dL (ref 3.6–5.1)
Bilirubin, Direct: 0.1 mg/dL (ref <=0.2)
Indirect Bilirubin: 0.3 mg/dL (ref 0.2–1.2)
TOTAL PROTEIN: 6.5 g/dL (ref 6.1–8.1)
Total Bilirubin: 0.4 mg/dL (ref 0.2–1.2)

## 2014-12-07 LAB — BASIC METABOLIC PANEL WITH GFR
BUN: 23 mg/dL (ref 7–25)
CHLORIDE: 101 mmol/L (ref 98–110)
CO2: 26 mmol/L (ref 20–31)
Calcium: 9.4 mg/dL (ref 8.6–10.4)
Creat: 1.17 mg/dL — ABNORMAL HIGH (ref 0.50–0.99)
GFR, Est African American: 56 mL/min — ABNORMAL LOW (ref >=60)
GFR, Est Non African American: 48 mL/min — ABNORMAL LOW (ref >=60)
GLUCOSE: 135 mg/dL — AB (ref 65–99)
POTASSIUM: 4.4 mmol/L (ref 3.5–5.3)
Sodium: 136 mmol/L (ref 135–146)

## 2014-12-07 LAB — LIPID PANEL
Cholesterol: 143 mg/dL (ref 125–200)
HDL: 54 mg/dL (ref >=46)
LDL Cholesterol: 60 mg/dL (ref <130)
Total CHOL/HDL Ratio: 2.6 Ratio (ref <=5.0)
Triglycerides: 144 mg/dL (ref <150)
VLDL: 29 mg/dL (ref <30)

## 2014-12-07 LAB — VITAMIN B12: Vitamin B-12: 374 pg/mL (ref 211–911)

## 2014-12-07 LAB — MAGNESIUM: Magnesium: 1.9 mg/dL (ref 1.5–2.5)

## 2014-12-07 LAB — IRON AND TIBC
%SAT: 21 % (ref 11–50)
IRON: 74 ug/dL (ref 45–160)
TIBC: 354 ug/dL (ref 250–450)
UIBC: 280 ug/dL (ref 125–400)

## 2014-12-07 MED ORDER — DIAZEPAM 5 MG PO TABS: 5.0000 mg | ORAL_TABLET | Freq: Three times a day (TID) | ORAL | Status: AC | PRN

## 2014-12-07 MED ORDER — PROMETHAZINE-DM 6.25-15 MG/5ML PO SYRP: ORAL_SOLUTION | ORAL | Status: AC

## 2014-12-07 NOTE — Patient Instructions (Signed)
Preventive Care for Adults  A healthy lifestyle and preventive care can promote health and wellness. Preventive health guidelines for women include the following key practices.  A routine yearly physical is a good way to check with your health care provider about your health and preventive screening. It is a chance to share any concerns and updates on your health and to receive a thorough exam.  Visit your dentist for a routine exam and preventive care every 6 months. Brush your teeth twice a day and floss once a day. Good oral hygiene prevents tooth decay and gum disease.  The frequency of eye exams is based on your age, health, family medical history, use of contact lenses, and other factors. Follow your health care provider's recommendations for frequency of eye exams.  Eat a healthy diet. Foods like vegetables, fruits, whole grains, low-fat dairy products, and lean protein foods contain the nutrients you need without too many calories. Decrease your intake of foods high in solid fats, added sugars, and salt. Eat the right amount of calories for you.Get information about a proper diet from your health care provider, if necessary.  Regular physical exercise is one of the most important things you can do for your health. Most adults should get at least 150 minutes of moderate-intensity exercise (any activity that increases your heart rate and causes you to sweat) each week. In addition, most adults need muscle-strengthening exercises on 2 or more days a week.  Maintain a healthy weight. The body mass index (BMI) is a screening tool to identify possible weight problems. It provides an estimate of body fat based on height and weight. Your health care provider can find your BMI and can help you achieve or maintain a healthy weight.For adults 20 years and older:  A BMI below 18.5 is considered underweight.  A BMI of 18.5 to 24.9 is normal.  A BMI of 25 to 29.9 is considered overweight.  A BMI  of 30 and above is considered obese.  Maintain normal blood lipids and cholesterol levels by exercising and minimizing your intake of saturated fat. Eat a balanced diet with plenty of fruit and vegetables. If your lipid or cholesterol levels are high, you are over 50, or you are at high risk for heart disease, you may need your cholesterol levels checked more frequently.Ongoing high lipid and cholesterol levels should be treated with medicines if diet and exercise are not working.  If you smoke, find out from your health care provider how to quit. If you do not use tobacco, do not start.  Lung cancer screening is recommended for adults aged 2-80 years who are at high risk for developing lung cancer because of a history of smoking. A yearly low-dose CT scan of the lungs is recommended for people who have at least a 30-pack-year history of smoking and are a current smoker or have quit within the past 15 years. A pack year of smoking is smoking an average of 1 pack of cigarettes a day for 1 year (for example: 1 pack a day for 30 years or 2 packs a day for 15 years). Yearly screening should continue until the smoker has stopped smoking for at least 15 years. Yearly screening should be stopped for people who develop a health problem that would prevent them from having lung cancer treatment.  Avoid use of street drugs. Do not share needles with anyone. Ask for help if you need support or instructions about stopping the use of drugs.  High  blood pressure causes heart disease and increases the risk of stroke.  Ongoing high blood pressure should be treated with medicines if weight loss and exercise do not work.  If you are 55-79 years old, ask your health care provider if you should take aspirin to prevent strokes.  Diabetes screening involves taking a blood sample to check your fasting blood sugar level. This should be done once every 3 years, after age 45, if you are within normal weight and without risk  factors for diabetes. Testing should be considered at a younger age or be carried out more frequently if you are overweight and have at least 1 risk factor for diabetes.  Breast cancer screening is essential preventive care for women. You should practice "breast self-awareness." This means understanding the normal appearance and feel of your breasts and may include breast self-examination. Any changes detected, no matter how small, should be reported to a health care provider. Women in their 20s and 30s should have a clinical breast exam (CBE) by a health care provider as part of a regular health exam every 1 to 3 years. After age 40, women should have a CBE every year. Starting at age 40, women should consider having a mammogram (breast X-ray test) every year. Women who have a family history of breast cancer should talk to their health care provider about genetic screening. Women at a high risk of breast cancer should talk to their health care providers about having an MRI and a mammogram every year.  Breast cancer gene (BRCA)-related cancer risk assessment is recommended for women who have family members with BRCA-related cancers. BRCA-related cancers include breast, ovarian, tubal, and peritoneal cancers. Having family members with these cancers may be associated with an increased risk for harmful changes (mutations) in the breast cancer genes BRCA1 and BRCA2. Results of the assessment will determine the need for genetic counseling and BRCA1 and BRCA2 testing.  Routine pelvic exams to screen for cancer are no longer recommended for nonpregnant women who are considered low risk for cancer of the pelvic organs (ovaries, uterus, and vagina) and who do not have symptoms. Ask your health care provider if a screening pelvic exam is right for you.  If you have had past treatment for cervical cancer or a condition that could lead to cancer, you need Pap tests and screening for cancer for at least 20 years after  your treatment. If Pap tests have been discontinued, your risk factors (such as having a new sexual partner) need to be reassessed to determine if screening should be resumed. Some women have medical problems that increase the chance of getting cervical cancer. In these cases, your health care provider may recommend more frequent screening and Pap tests.    Colorectal cancer can be detected and often prevented. Most routine colorectal cancer screening begins at the age of 50 years and continues through age 75 years. However, your health care provider may recommend screening at an earlier age if you have risk factors for colon cancer. On a yearly basis, your health care provider may provide home test kits to check for hidden blood in the stool. Use of a small camera at the end of a tube, to directly examine the colon (sigmoidoscopy or colonoscopy), can detect the earliest forms of colorectal cancer. Talk to your health care provider about this at age 50, when routine screening begins. Direct exam of the colon should be repeated every 5-10 years through age 75 years, unless early forms of pre-cancerous   polyps or small growths are found.  Osteoporosis is a disease in which the bones lose minerals and strength with aging. This can result in serious bone fractures or breaks. The risk of osteoporosis can be identified using a bone density scan. Women ages 68 years and over and women at risk for fractures or osteoporosis should discuss screening with their health care providers. Ask your health care provider whether you should take a calcium supplement or vitamin D to reduce the rate of osteoporosis.  Menopause can be associated with physical symptoms and risks. Hormone replacement therapy is available to decrease symptoms and risks. You should talk to your health care provider about whether hormone replacement therapy is right for you.  Use sunscreen. Apply sunscreen liberally and repeatedly throughout the day.  You should seek shade when your shadow is shorter than you. Protect yourself by wearing long sleeves, pants, a wide-brimmed hat, and sunglasses year round, whenever you are outdoors.  Once a month, do a whole body skin exam, using a mirror to look at the skin on your back. Tell your health care provider of new moles, moles that have irregular borders, moles that are larger than a pencil eraser, or moles that have changed in shape or color.  Stay current with required vaccines (immunizations).  Influenza vaccine. All adults should be immunized every year.  Tetanus, diphtheria, and acellular pertussis (Td, Tdap) vaccine. Pregnant women should receive 1 dose of Tdap vaccine during each pregnancy. The dose should be obtained regardless of the length of time since the last dose. Immunization is preferred during the 27th-36th week of gestation. An adult who has not previously received Tdap or who does not know her vaccine status should receive 1 dose of Tdap. This initial dose should be followed by tetanus and diphtheria toxoids (Td) booster doses every 10 years. Adults with an unknown or incomplete history of completing a 3-dose immunization series with Td-containing vaccines should begin or complete a primary immunization series including a Tdap dose. Adults should receive a Td booster every 10 years.    Zoster vaccine. One dose is recommended for adults aged 36 years or older unless certain conditions are present.    Pneumococcal 13-valent conjugate (PCV13) vaccine. When indicated, a person who is uncertain of her immunization history and has no record of immunization should receive the PCV13 vaccine. An adult aged 1 years or older who has certain medical conditions and has not been previously immunized should receive 1 dose of PCV13 vaccine. This PCV13 should be followed with a dose of pneumococcal polysaccharide (PPSV23) vaccine. The PPSV23 vaccine dose should be obtained at least 8 weeks after the  dose of PCV13 vaccine. An adult aged 73 years or older who has certain medical conditions and previously received 1 or more doses of PPSV23 vaccine should receive 1 dose of PCV13. The PCV13 vaccine dose should be obtained 1 or more years after the last PPSV23 vaccine dose.    Pneumococcal polysaccharide (PPSV23) vaccine. When PCV13 is also indicated, PCV13 should be obtained first. All adults aged 47 years and older should be immunized. An adult younger than age 86 years who has certain medical conditions should be immunized. Any person who resides in a nursing home or long-term care facility should be immunized. An adult smoker should be immunized. People with an immunocompromised condition and certain other conditions should receive both PCV13 and PPSV23 vaccines. People with human immunodeficiency virus (HIV) infection should be immunized as soon as possible after diagnosis. Immunization  chemotherapy or radiation therapy should be avoided. Routine use of PPSV23 vaccine is not recommended for American Indians, [place] Natives, or people younger than 65 years unless there are medical conditions that require PPSV23 vaccine. When indicated, people who have unknown immunization and have no record of immunization should receive PPSV23 vaccine. One-time revaccination 5 years after the first dose of PPSV23 is recommended for people aged 19-64 years who have chronic kidney failure, nephrotic syndrome, asplenia, or immunocompromised conditions. People who received 1-2 doses of PPSV23 before age 65 years should receive another dose of PPSV23 vaccine at age 65 years or later if at least 5 years have passed since the previous dose. Doses of PPSV23 are not needed for people immunized with PPSV23 at or after age 65 years.   Preventive Services / Frequency  Ages 65 years and over  Blood pressure check.  Lipid and cholesterol check.  Lung cancer screening. / Every year if you are aged 55-80 years and have a  30-pack-year history of smoking and currently smoke or have quit within the past 15 years. Yearly screening is stopped once you have quit smoking for at least 15 years or develop a health problem that would prevent you from having lung cancer treatment.  Clinical breast exam.** / Every year after age 40 years.  BRCA-related cancer risk assessment.** / For women who have family members with a BRCA-related cancer (breast, ovarian, tubal, or peritoneal cancers).  Mammogram.** / Every year beginning at age 40 years and continuing for as long as you are in good health. Consult with your health care provider.  Pap test.** / Every 3 years starting at age 30 years through age 65 or 70 years with 3 consecutive normal Pap tests. Testing can be stopped between 65 and 70 years with 3 consecutive normal Pap tests and no abnormal Pap or HPV tests in the past 10 years.  Fecal occult blood test (FOBT) of stool. / Every year beginning at age 50 years and continuing until age 75 years. You may not need to do this test if you get a colonoscopy every 10 years.  Flexible sigmoidoscopy or colonoscopy.** / Every 5 years for a flexible sigmoidoscopy or every 10 years for a colonoscopy beginning at age 50 years and continuing until age 75 years.  Hepatitis C blood test.** / For all people born from 1945 through 1965 and any individual with known risks for hepatitis C.  Osteoporosis screening.** / A one-time screening for women ages 65 years and over and women at risk for fractures or osteoporosis.  Skin self-exam. / Monthly.  Influenza vaccine. / Every year.  Tetanus, diphtheria, and acellular pertussis (Tdap/Td) vaccine.** / 1 dose of Td every 10 years.  Zoster vaccine.** / 1 dose for adults aged 60 years or older.  Pneumococcal 13-valent conjugate (PCV13) vaccine.** / Consult your health care provider.  Pneumococcal polysaccharide (PPSV23) vaccine.** / 1 dose for all adults aged 65 years and older. Screening  for abdominal aortic aneurysm (AAA)  by ultrasound is recommended for people who have history of high blood pressure or who are current or former smokers.  

## 2014-12-07 NOTE — Progress Notes (Signed)
Patient ID: Elizabeth May, female   DOB: 1945/06/30, 69 y.o.   MRN: [id number]   [facility] VISIT AND CPE  Assessment:    1. Essential hypertension  - Urinalysis, Routine w reflex microscopic (not at [facility]) - Microalbumin / creatinine urine ratio - EKG 12-Lead - TSH  2. Type 2 diabetes mellitus with diabetic chronic kidney disease  - Hemoglobin A1c - Insulin, random  3. Hypothyroidism, unspecified hypothyroidism type -cont levothyoxine  4. Mixed hyperlipidemia  - Lipid panel  5. Vitamin D deficiency  - Vit D  25 hydroxy (rtn osteoporosis monitoring)  6. Medication management  - CBC with Differential/Platelet - BASIC METABOLIC PANEL WITH GFR - Hepatic function panel - Magnesium  7. Cervical radiculopathy at C6 -followed by ortho  8. Encounter for general adult medical examination with abnormal findings   9. Deficiency anemia  - Iron and TIBC - Vitamin B12  10. Screening for rectal cancer  - POC Hemoccult Bld/Stl (3-Cd Home Screen); Future  11. Need for prophylactic vaccination and inoculation against influenza  - Flu vaccine HIGH DOSE PF (Fluzone High dose)    Over 40 minutes of exam, counseling, chart review and critical decision making was performed  Plan:   During the course of the visit the patient was educated and counseled about appropriate screening and preventive services including:    Pneumococcal vaccine   Prevnar 13  Influenza vaccine  Td vaccine  Screening electrocardiogram  Bone densitometry screening  Colorectal cancer screening  Diabetes screening  Glaucoma screening  Nutrition counseling   Advanced directives: requested  Conditions/risks identified: BMI: Discussed weight loss, diet, and increase physical activity.  Increase physical activity: AHA recommends 150 minutes of physical activity a week.  Medications reviewed Diabetes is at goal, ACE/ARB therapy: Yes. Urinary Incontinence is an issue:  discussed non pharmacology and pharmacology options.  Fall risk: low- discussed PT, home fall assessment, medications.    Subjective:  Elizabeth May is a 69 y.o. female who presents for Medicare Annual Wellness Visit and complete physical.  Date of last medicare wellness visit is unknown.  She has had elevated blood pressure for  years. Her blood pressure has been controlled at home, today their BP is BP: 136/62 mmHg She does workout. She denies chest pain, shortness of breath, dizziness.  She does walk on a treadmill and checks blood pressure occasionally at home.     She is on cholesterol medication and denies myalgias. Her cholesterol is at goal. The cholesterol last visit was:   Lab Results  Component Value Date   CHOL 129 06/27/2014   HDL 49 06/27/2014   LDLCALC 60 06/27/2014   TRIG 102 06/27/2014   CHOLHDL 2.6 06/27/2014   She has had diabetes controlled with metformin. She has been working on diet and exercise for diabetes, and denies foot ulcerations, hyperglycemia, hypoglycemia , increased appetite, nausea, paresthesia of the feet, polydipsia, polyuria, visual disturbances, vomiting and weight loss. Last A1C in the office was:  Lab Results  Component Value Date   HGBA1C 7.6* 06/27/2014   Patient is on Vitamin D supplement.   Lab Results  Component Value Date   VD25OH 50 06/27/2014      Medication Review: Current Outpatient Prescriptions on File Prior to Visit  Medication Sig Dispense Refill  . amLODipine (NORVASC) 5 MG tablet Take 1 tablet (5 mg total) by mouth daily. 30 tablet 3  . atorvastatin (LIPITOR) 10 MG tablet Take 1 tablet (10 mg total) by mouth daily.  90 tablet 1  . Biotin 10 MG TABS Take by mouth daily.    . Calcium Carbonate-Vitamin D (CALCIUM-VITAMIN D) 500-200 MG-UNIT per tablet Take 1 tablet by mouth 2 (two) times daily with a meal.    . CINNAMON PO Take 2 capsules by mouth daily.     Marland Kitchen levothyroxine (SYNTHROID, LEVOTHROID) 50 MCG tablet Take 1 tablet  (50 mcg total) by mouth daily. 30 tablet 0  . lisinopril-hydrochlorothiazide (PRINZIDE,ZESTORETIC) 20-25 MG per tablet Take 1 tablet by mouth daily. 90 tablet 1  . Magnesium 250 MG TABS Take 250 mg by mouth daily.    . metFORMIN (GLUCOPHAGE XR) 500 MG 24 hr tablet Take 2 tablets 2 x daily with a meal for Diabetes 360 tablet 1  . Omega-3 Fatty Acids (FISH OIL) 1000 MG CAPS Take by mouth daily.    . traMADol (ULTRAM) 50 MG tablet Take 1 tablet (50 mg total) by mouth every 6 (six) hours as needed. 60 tablet 0  . traZODone (DESYREL) 150 MG tablet TAKE 1/3 TO 1/2 TO 1 TABLET AT BEDTIME AS NEEDED FOR SLEEP 30 tablet 3   No current facility-administered medications on file prior to visit.    Current Problems (verified) Patient Active Problem List   Diagnosis Date Noted  . Medication management 03/08/2014  . Mixed hyperlipidemia 03/21/2013  . Vitamin D deficiency 03/21/2013  . T2_NIDDM w/Stage 2 CKD (GFR 51 ml/min) 03/17/2013  . Hypothyroidism   . Cervical radiculopathy at C6 10/22/2011  . Essential hypertension 07/01/2009    Screening Tests Immunization History  Administered Date(s) Administered  . Influenza Split 02/13/2013  . Influenza-Unspecified 03/26/2014  . Pneumococcal-Unspecified 03/23/2008  . Tdap 07/17/2010  . Zoster 01/14/2012    Preventative care: Last colonoscopy: 2006, due for another Last mammogram: Dec 2015 DEXA:2013 No results found for this or any previous visit.   Names of Other Physician/Practitioners you currently use: 1. [facility] here for primary care 2. Dr. Caryn [facility] at [facility], eye doctor, last visit 2014 3. Dr. Dulce Sellar, dentist, last visit 2014 Patient Care Team: [name], MD as PCP - General (Internal Medicine) Kathleene Hazel, MD as Consulting Physician (Cardiology) Charna Elizabeth, MD as Consulting Physician (Gastroenterology) Estill Bamberg, MD as Consulting Physician (Orthopedic Surgery) Lacretia Nicks  Varney Baas, MD as Consulting Physician (Obstetrics and Gynecology)  SURGICAL HISTORY Past Surgical History  Procedure Laterality Date  . Breast surgery  80's    lft cyst  . Anterior cervical decomp/discectomy fusion  10/22/2011    Procedure: ANTERIOR CERVICAL DECOMPRESSION/DISCECTOMY FUSION 1 LEVEL;  Surgeon: Emilee Hero, MD;  Location: [facility] OR;  Service: Orthopedics;  Laterality: Right;  Anterior cervical decompression fusion, cervical 5-6 with instrumentation and allograft.   FAMILY HISTORY Family History  Problem Relation Age of Onset  . Hypertension Mother   . Hyperlipidemia Mother   . Heart disease Mother   . Drug abuse Maternal Grandmother    SOCIAL HISTORY Social History  Substance Use Topics  . Smoking status: Never Smoker   . Smokeless tobacco: None     Comment: occ social  . Alcohol Use: 3.5 oz/week    7 drink(s) per week    MEDICARE WELLNESS OBJECTIVES: Tobacco use: She does not smoke.  Patient is not a former smoker. If yes, counseling given Alcohol Current alcohol use: glass of wine with dinner Osteoporosis: postmenopausal estrogen deficiency, History of fracture in the past year: no Fall risk: Low Risk Hearing: normal Visual acuity: normal,  does  not perform annual eye exam Diet: well balanced Physical activity: Current Exercise Habits:: Structured exercise class, Type of exercise: walking;treadmill, Time (Minutes): 45, Frequency (Times/Week): 5, Weekly Exercise (Minutes/Week): 225, Intensity: Moderate Cardiac risk factors: Cardiac Risk Factors include: dyslipidemia;hypertension;family history of premature cardiovascular disease Depression/mood screen:   Depression screen [facility] 2/9 12/07/2014  Decreased Interest 0  Down, Depressed, Hopeless 0  PHQ - 2 Score 0    ADLs:  In your present state of health, do you have any difficulty performing the following activities: 12/07/2014  Hearing? N  Vision? N  Difficulty concentrating or making decisions? N   Walking or climbing stairs? N  Dressing or bathing? N  Doing errands, shopping? N  Preparing Food and eating ? N  Using the Toilet? N  In the past six months, have you accidently leaked urine? N  Do you have problems with loss of bowel control? N  Managing your Medications? N  Managing your Finances? N  Housekeeping or managing your Housekeeping? N     Cognitive Testing  Alert? Yes  Normal Appearance?Yes  Oriented to person? Yes  Place? Yes   Time? Yes  Recall of three objects?  Yes  Can perform simple calculations? Yes  Displays appropriate judgment?Yes  Can read the correct time from a watch face?Yes  EOL planning: Does patient have an advance directive?: No Would patient like information on creating an advanced directive?: Yes - Educational materials given  Review of Systems  Constitutional: Negative for fever, chills and malaise/fatigue.  HENT: Positive for congestion. Negative for ear pain, hearing loss, nosebleeds, sore throat and tinnitus.   Eyes: Negative for blurred vision and double vision.  Respiratory: Positive for cough. Negative for shortness of breath and wheezing.   Cardiovascular: Negative for chest pain, palpitations and leg swelling.  Gastrointestinal: Negative for heartburn, diarrhea, constipation, blood in stool and melena.  Genitourinary: Negative.   Neurological: Negative for dizziness, sensory change, loss of consciousness and headaches.  Psychiatric/Behavioral: Negative for depression. The patient has insomnia. The patient is not nervous/anxious.      Objective:     Today's Vitals   12/07/14 0948  BP: 136/62  Pulse: 68  Temp: 98.6 F (37 C)  TempSrc: Temporal  Resp: 18  Height: 5' 3.5" (1.613 m)  Weight: 138 lb (62.596 kg)   Body mass index is 24.06 kg/(m^2).  General appearance: alert, no distress, WD/WN, female HEENT: normocephalic, sclerae anicteric, TMs pearly, nares patent, no discharge or erythema, pharynx normal Oral cavity: MMM,  no lesions Neck: supple, no lymphadenopathy, no thyromegaly, no masses Heart: RRR, normal S1, S2, no murmurs Lungs: CTA bilaterally, no wheezes, rhonchi, or rales Abdomen: +bs, soft, non tender, non distended, no masses, no hepatomegaly, no splenomegaly Musculoskeletal: nontender, no swelling, no obvious deformity Extremities: no edema, no cyanosis, no clubbing Pulses: 2+ symmetric, upper and lower extremities, normal cap refill Neurological: alert, oriented x 3, CN2-12 intact, strength normal upper extremities and lower extremities, sensation normal throughout, DTRs 2+ throughout, no cerebellar signs, gait normal Psychiatric: normal affect, behavior normal, pleasant   Medicare Attestation I have personally reviewed: The patient's medical and social history Their use of alcohol, tobacco or illicit drugs Their current medications and supplements The patient's functional ability including ADLs,fall risks, home safety risks, cognitive, and hearing and visual impairment Diet and physical activities Evidence for depression or mood disorders  The patient's weight, height, BMI, and visual acuity have been recorded in the chart.  I have made referrals, counseling, and provided education  to the patient based on review of the above and I have provided the patient with a written personalized care plan for preventive services.     Terri Piedra, PA-C   12/07/2014

## 2014-12-08 LAB — HEMOGLOBIN A1C
Hgb A1c MFr Bld: 7.7 % — ABNORMAL HIGH (ref <5.7)
MEAN PLASMA GLUCOSE: 174 mg/dL — AB (ref <117)

## 2014-12-08 LAB — URINALYSIS, ROUTINE W REFLEX MICROSCOPIC
Bilirubin Urine: NEGATIVE
GLUCOSE, UA: NEGATIVE
HGB URINE DIPSTICK: NEGATIVE
Ketones, ur: NEGATIVE
LEUKOCYTES UA: NEGATIVE
Nitrite: NEGATIVE
PH: 5 (ref 5.0–8.0)
Protein, ur: NEGATIVE
SPECIFIC GRAVITY, URINE: 1.019 (ref 1.001–1.035)

## 2014-12-08 LAB — MICROALBUMIN / CREATININE URINE RATIO
Creatinine, Urine: 137.8 mg/dL
MICROALB/CREAT RATIO: 2.2 mg/g (ref 0.0–30.0)
Microalb, Ur: 0.3 mg/dL (ref <2.0)

## 2014-12-08 LAB — VITAMIN D 25 HYDROXY (VIT D DEFICIENCY, FRACTURES): VIT D 25 HYDROXY: 45 ng/mL (ref 30–100)

## 2014-12-08 LAB — INSULIN, RANDOM: INSULIN: 3.5 u[IU]/mL (ref 2.0–19.6)

## 2014-12-10 ENCOUNTER — Other Ambulatory Visit: Payer: Self-pay | Admitting: Internal Medicine

## 2014-12-10 MED ORDER — GLIPIZIDE 5 MG PO TABS: 5.0000 mg | ORAL_TABLET | Freq: Two times a day (BID) | ORAL | Status: DC

## 2014-12-17 ENCOUNTER — Other Ambulatory Visit: Payer: Self-pay | Admitting: *Deleted

## 2014-12-17 DIAGNOSIS — E1122 Type 2 diabetes mellitus with diabetic chronic kidney disease: Secondary | ICD-10-CM

## 2014-12-17 DIAGNOSIS — E039 Hypothyroidism, unspecified: Secondary | ICD-10-CM

## 2014-12-17 DIAGNOSIS — I10 Essential (primary) hypertension: Secondary | ICD-10-CM

## 2014-12-17 MED ORDER — GLIPIZIDE 5 MG PO TABS: 5.0000 mg | ORAL_TABLET | Freq: Two times a day (BID) | ORAL | Status: DC

## 2014-12-17 MED ORDER — METFORMIN HCL ER 500 MG PO TB24: ORAL_TABLET | ORAL | Status: DC

## 2014-12-17 MED ORDER — AMLODIPINE BESYLATE 5 MG PO TABS: 5.0000 mg | ORAL_TABLET | Freq: Every day | ORAL | Status: DC

## 2014-12-17 MED ORDER — LISINOPRIL-HYDROCHLOROTHIAZIDE 20-25 MG PO TABS: 1.0000 | ORAL_TABLET | Freq: Every day | ORAL | Status: DC

## 2014-12-17 MED ORDER — LEVOTHYROXINE SODIUM 50 MCG PO TABS: 50.0000 ug | ORAL_TABLET | Freq: Every day | ORAL | Status: DC

## 2015-04-03 ENCOUNTER — Ambulatory Visit: Payer: Self-pay | Admitting: Internal Medicine

## 2015-04-22 ENCOUNTER — Ambulatory Visit (INDEPENDENT_AMBULATORY_CARE_PROVIDER_SITE_OTHER): Payer: Medicare Other | Admitting: Internal Medicine

## 2015-04-22 ENCOUNTER — Encounter: Payer: Self-pay | Admitting: Internal Medicine

## 2015-04-22 VITALS — BP 124/70 | HR 72 | Temp 97.7°F | Resp 16 | Ht 64.0 in | Wt 145.4 lb

## 2015-04-22 DIAGNOSIS — N182 Chronic kidney disease, stage 2 (mild): Secondary | ICD-10-CM

## 2015-04-22 DIAGNOSIS — E1122 Type 2 diabetes mellitus with diabetic chronic kidney disease: Secondary | ICD-10-CM | POA: Diagnosis not present

## 2015-04-22 DIAGNOSIS — G894 Chronic pain syndrome: Secondary | ICD-10-CM

## 2015-04-22 DIAGNOSIS — I10 Essential (primary) hypertension: Secondary | ICD-10-CM

## 2015-04-22 DIAGNOSIS — E559 Vitamin D deficiency, unspecified: Secondary | ICD-10-CM

## 2015-04-22 DIAGNOSIS — E039 Hypothyroidism, unspecified: Secondary | ICD-10-CM | POA: Diagnosis not present

## 2015-04-22 DIAGNOSIS — E782 Mixed hyperlipidemia: Secondary | ICD-10-CM | POA: Diagnosis not present

## 2015-04-22 DIAGNOSIS — Z79899 Other long term (current) drug therapy: Secondary | ICD-10-CM | POA: Diagnosis not present

## 2015-04-22 LAB — CBC WITH DIFFERENTIAL/PLATELET
BASOS ABS: 0 10*3/uL (ref 0.0–0.1)
BASOS PCT: 0 % (ref 0–1)
EOS ABS: 0.1 10*3/uL (ref 0.0–0.7)
EOS PCT: 1 % (ref 0–5)
HCT: 45.4 % (ref 36.0–46.0)
Hemoglobin: 14.9 g/dL (ref 12.0–15.0)
LYMPHS ABS: 2 10*3/uL (ref 0.7–4.0)
Lymphocytes Relative: 21 % (ref 12–46)
MCH: 28.2 pg (ref 26.0–34.0)
MCHC: 32.8 g/dL (ref 30.0–36.0)
MCV: 85.8 fL (ref 78.0–100.0)
MPV: 10.3 fL (ref 8.6–12.4)
Monocytes Absolute: 0.5 10*3/uL (ref 0.1–1.0)
Monocytes Relative: 5 % (ref 3–12)
NEUTROS PCT: 73 % (ref 43–77)
Neutro Abs: 7.1 10*3/uL (ref 1.7–7.7)
PLATELETS: 422 10*3/uL — AB (ref 150–400)
RBC: 5.29 MIL/uL — ABNORMAL HIGH (ref 3.87–5.11)
RDW: 13.9 % (ref 11.5–15.5)
WBC: 9.7 10*3/uL (ref 4.0–10.5)

## 2015-04-22 LAB — HEPATIC FUNCTION PANEL
ALBUMIN: 4.6 g/dL (ref 3.6–5.1)
ALT: 15 U/L (ref 6–29)
AST: 19 U/L (ref 10–35)
Alkaline Phosphatase: 73 U/L (ref 33–130)
BILIRUBIN DIRECT: 0.1 mg/dL (ref <=0.2)
BILIRUBIN TOTAL: 0.5 mg/dL (ref 0.2–1.2)
Indirect Bilirubin: 0.4 mg/dL (ref 0.2–1.2)
Total Protein: 7 g/dL (ref 6.1–8.1)

## 2015-04-22 LAB — HEMOGLOBIN A1C
HEMOGLOBIN A1C: 6.5 % — AB (ref <5.7)
MEAN PLASMA GLUCOSE: 140 mg/dL — AB (ref <117)

## 2015-04-22 LAB — LIPID PANEL
CHOLESTEROL: 145 mg/dL (ref 125–200)
HDL: 63 mg/dL (ref >=46)
LDL CALC: 61 mg/dL (ref <130)
TRIGLYCERIDES: 106 mg/dL (ref <150)
Total CHOL/HDL Ratio: 2.3 Ratio (ref <=5.0)
VLDL: 21 mg/dL (ref <30)

## 2015-04-22 LAB — BASIC METABOLIC PANEL WITH GFR
BUN: 20 mg/dL (ref 7–25)
CO2: 27 mmol/L (ref 20–31)
CREATININE: 1.09 mg/dL — AB (ref 0.50–0.99)
Calcium: 10.5 mg/dL — ABNORMAL HIGH (ref 8.6–10.4)
Chloride: 102 mmol/L (ref 98–110)
GFR, EST AFRICAN AMERICAN: 61 mL/min (ref >=60)
GFR, Est Non African American: 53 mL/min — ABNORMAL LOW (ref >=60)
Glucose, Bld: 95 mg/dL (ref 65–99)
POTASSIUM: 5 mmol/L (ref 3.5–5.3)
SODIUM: 138 mmol/L (ref 135–146)

## 2015-04-22 LAB — TSH: TSH: 2.508 u[IU]/mL (ref 0.350–4.500)

## 2015-04-22 LAB — MAGNESIUM: Magnesium: 2.2 mg/dL (ref 1.5–2.5)

## 2015-04-22 MED ORDER — TRAMADOL HCL 50 MG PO TABS: ORAL_TABLET | ORAL | Status: DC

## 2015-04-22 NOTE — Progress Notes (Signed)
Patient ID: Elizabeth May, female   DOB: [birth year], 70 y.o.   MRN: [id number]   This very nice 70 y.o.female presents for 3 month follow up with Hypertension, Hyperlipidemia, Pre-Diabetes and Vitamin D Deficiency.    Patient is treated for HTN circa 2007 & BP has been controlled at home. Today's BP: 124/70 mmHg. Patient has had no complaints of any cardiac type chest pain, palpitations, dyspnea/orthopnea/PND, dizziness, claudication, or dependent edema. Alleges exercising on a treadmill x 1 hr 5 to 6 x/week.    Hyperlipidemia is controlled with diet & meds. Patient denies myalgias or other med SE's. Last Lipids were at goal with Cholesterol 143; HDL 54; LDL Cholesterol 60; Triglycerides 144 on 12/07/2014.   Also, the patient has history of T2_NIDDM w/CKD 2 predating since 2006 and has had no symptoms of reactive hypoglycemia, diabetic polys, paresthesias or visual blurring.  She reports FBG's are ranging 90-140 mg% and she is maintaining her weight. Last A1c was not at goal 7.7% on 12/07/2014.    Further, the patient also has history of Vitamin D Deficiency and supplements vitamin D without any suspected side-effects. Last vitamin D was  45 on 12/07/2014.    Medication Sig  . amLODipine  5 MG tablet Take 1 tablet (5 mg total) by mouth daily.  Marland Kitchen atorvastatin  10 MG tablet Take 1 tablet (10 mg total) by mouth daily.  . Biotin 10 MG TABS Take by mouth daily.  Marland Kitchen CALCIUM-VITAMIN D 500-200  Take 1 tablet by mouth 2 (two) times daily with a meal.  . CINNAMON  Take 1,000 Units by mouth daily. 2 capsules daily  . Diazepam 5 MG tablet Take 1 tablet (5 mg total) by mouth every 8 (eight) hours as needed for anxiety.  Marland Kitchen glipiZIDE  5 MG tablet Take 1 tablet (5 mg total) by mouth 2 (two) times daily before a meal.  . levothyroxine ) 50 MCG tablet Take 1 tablet (50 mcg total) by mouth daily.  Marland Kitchen lisinopril-hctz 20-25 MG per tablet Take 1 tablet by mouth daily.  . Magnesium 250 MG TABS Take 250 mg by mouth  daily.  . metFORMIN- XR) 500 MG  Take 2 tablets 2 x daily with a meal for Diabetes  . Omega-3FISH OIL 1000 MG CAPS Take by mouth daily.  . traMADol (ULTRAM) 50 MG tablet Take 1 tablet (50 mg total) by mouth every 6 (six) hours as needed.   No Known Allergies  PMHx:   Past Medical History  Diagnosis Date  . Hypertension   . Diabetes mellitus   . Headache(784.0)     hx migraines  . Hypothyroidism   . Hyperlipidemia    Immunization History  Administered Date(s) Administered  . Influenza Split 02/13/2013  . Influenza, High Dose Seasonal PF 12/07/2014  . Influenza-Unspecified 03/26/2014  . Pneumococcal-Unspecified 03/23/2008  . Tdap 07/17/2010  . Zoster 01/14/2012   Past Surgical History  Procedure Laterality Date  . Breast surgery  80's    lft cyst  . Anterior cervical decomp/discectomy fusion  10/22/2011    Procedure: ANTERIOR CERVICAL DECOMPRESSION/DISCECTOMY FUSION 1 LEVEL;  Surgeon: Emilee Hero, MD;  Location[facility] OR;  Service: Orthopedics;  Laterality: Right;  Anterior cervical decompression fusion, cervical 5-6 with instrumentation and allograft.   FHx:    Reviewed / unchanged  SHx:    Reviewed / unchanged  Systems Review:  Constitutional: Denies fever, chills, wt changes, headaches, insomnia, fatigue, night sweats, change in appetite. Eyes: Denies redness, blurred vision, diplopia,  discharge, itchy, watery eyes.  ENT: Denies discharge, congestion, post nasal drip, epistaxis, sore throat, earache, hearing loss, dental pain, tinnitus, vertigo, sinus pain, snoring.  CV: Denies chest pain, palpitations, irregular heartbeat, syncope, dyspnea, diaphoresis, orthopnea, PND, claudication or edema. Respiratory: denies cough, dyspnea, DOE, pleurisy, hoarseness, laryngitis, wheezing.  Gastrointestinal: Denies dysphagia, odynophagia, heartburn, reflux, water brash, abdominal pain or cramps, nausea, vomiting, bloating, diarrhea, constipation, hematemesis, melena, hematochezia   or hemorrhoids. Genitourinary: Denies dysuria, frequency, urgency, nocturia, hesitancy, discharge, hematuria or flank pain. Musculoskeletal: Denies arthralgias, myalgias, stiffness, jt. swelling, pain, limping or strain/sprain.  Skin: Denies pruritus, rash, hives, warts, acne, eczema or change in skin lesion(s). Neuro: No weakness, tremor, incoordination, spasms, paresthesia or pain. Psychiatric: Denies confusion, memory loss or sensory loss. Endo: Denies change in weight, skin or hair change.  Heme/Lymph: No excessive bleeding, bruising or enlarged lymph nodes.  Physical Exam  BP 124/70 mmHg  Pulse 72  Temp(Src) 97.7 F (36.5 C)  Resp 16  Ht  (1.626 m)  Wt 145 lb 6.4 oz (65.953 kg)  BMI 24.95 kg/m2  Appears well nourished and in no distress. Eyes: PERRLA, EOMs, conjunctiva no swelling or erythema. Sinuses: No frontal/maxillary tenderness ENT/Mouth: EAC's clear, TM's nl w/o erythema, bulging. Nares clear w/o erythema, swelling, exudates. Oropharynx clear without erythema or exudates. Oral hygiene is good. Tongue normal, non obstructing. Hearing intact.  Neck: Supple. Thyroid nl. Car 2+/2+ without bruits, nodes or JVD. Chest: Respirations nl with BS clear & equal w/o rales, rhonchi, wheezing or stridor.  Cor: Heart sounds normal w/ regular rate and rhythm without sig. murmurs, gallops, clicks, or rubs. Peripheral pulses normal and equal  without edema.  Abdomen: Soft & bowel sounds normal. Non-tender w/o guarding, rebound, hernias, masses, or organomegaly.  Lymphatics: Unremarkable.  Musculoskeletal: Full ROM all peripheral extremities, joint stability, 5/5 strength, and normal gait.  Skin: Warm, dry without exposed rashes, lesions or ecchymosis apparent.  Neuro: Cranial nerves intact, reflexes equal bilaterally. Sensory-motor testing grossly intact to touch and Monofilament testing to the toes bilaterally. Tendon reflexes grossly intact.  Pysch: Alert & oriented x 3.  Insight and  judgement nl & appropriate. No ideations.  Assessment and Plan:  1. Essential hypertension  - TSH  2. Mixed hyperlipidemia  - Lipid panel - TSH  3. Type 2 diabetes mellitus with stage 2 chronic kidney disease, without long-term current use of insulin (HCC)  - Hemoglobin A1c - Insulin, random  4. Vitamin D deficiency  - VITAMIN D 25 Hydroxy  5. Hypothyroidism   6. Medication management  - CBC with Differential/Platelet - BASIC METABOLIC PANEL WITH GFR - Hepatic function panel - Magnesium   Recommended regular exercise, BP monitoring, weight control, and discussed med and SE's. Recommended labs to assess and monitor clinical status. Further disposition pending results of labs. Over 30 minutes of exam, counseling, chart review was performed

## 2015-04-22 NOTE — Patient Instructions (Signed)

## 2015-04-23 LAB — VITAMIN D 25 HYDROXY (VIT D DEFICIENCY, FRACTURES): Vit D, 25-Hydroxy: 40 ng/mL (ref 30–100)

## 2015-04-23 LAB — INSULIN, RANDOM: INSULIN: 4.2 u[IU]/mL (ref 2.0–19.6)

## 2015-07-12 ENCOUNTER — Other Ambulatory Visit: Payer: Self-pay | Admitting: *Deleted

## 2015-07-12 DIAGNOSIS — I10 Essential (primary) hypertension: Secondary | ICD-10-CM

## 2015-07-12 MED ORDER — AMLODIPINE BESYLATE 5 MG PO TABS: 5.0000 mg | ORAL_TABLET | Freq: Every day | ORAL | Status: DC

## 2015-07-25 ENCOUNTER — Ambulatory Visit: Payer: Self-pay | Admitting: Internal Medicine

## 2015-09-25 ENCOUNTER — Other Ambulatory Visit: Payer: Self-pay | Admitting: Internal Medicine

## 2015-12-09 ENCOUNTER — Ambulatory Visit (INDEPENDENT_AMBULATORY_CARE_PROVIDER_SITE_OTHER): Payer: Medicare Other | Admitting: Internal Medicine

## 2015-12-09 ENCOUNTER — Encounter: Payer: Self-pay | Admitting: Internal Medicine

## 2015-12-09 VITALS — BP 122/70 | HR 60 | Temp 98.0°F | Resp 16 | Ht 63.75 in | Wt 146.0 lb

## 2015-12-09 DIAGNOSIS — Z0000 Encounter for general adult medical examination without abnormal findings: Secondary | ICD-10-CM

## 2015-12-09 DIAGNOSIS — Z0001 Encounter for general adult medical examination with abnormal findings: Secondary | ICD-10-CM

## 2015-12-09 DIAGNOSIS — L989 Disorder of the skin and subcutaneous tissue, unspecified: Secondary | ICD-10-CM

## 2015-12-09 DIAGNOSIS — E782 Mixed hyperlipidemia: Secondary | ICD-10-CM

## 2015-12-09 DIAGNOSIS — I10 Essential (primary) hypertension: Secondary | ICD-10-CM | POA: Diagnosis not present

## 2015-12-09 DIAGNOSIS — N182 Chronic kidney disease, stage 2 (mild): Secondary | ICD-10-CM

## 2015-12-09 DIAGNOSIS — E039 Hypothyroidism, unspecified: Secondary | ICD-10-CM

## 2015-12-09 DIAGNOSIS — Z136 Encounter for screening for cardiovascular disorders: Secondary | ICD-10-CM | POA: Diagnosis not present

## 2015-12-09 DIAGNOSIS — E1122 Type 2 diabetes mellitus with diabetic chronic kidney disease: Secondary | ICD-10-CM

## 2015-12-09 DIAGNOSIS — E559 Vitamin D deficiency, unspecified: Secondary | ICD-10-CM

## 2015-12-09 DIAGNOSIS — Z1211 Encounter for screening for malignant neoplasm of colon: Secondary | ICD-10-CM

## 2015-12-09 DIAGNOSIS — Z79899 Other long term (current) drug therapy: Secondary | ICD-10-CM

## 2015-12-09 DIAGNOSIS — Z23 Encounter for immunization: Secondary | ICD-10-CM

## 2015-12-09 DIAGNOSIS — M5412 Radiculopathy, cervical region: Secondary | ICD-10-CM

## 2015-12-09 DIAGNOSIS — Z1159 Encounter for screening for other viral diseases: Secondary | ICD-10-CM

## 2015-12-09 LAB — CBC WITH DIFFERENTIAL/PLATELET
BASOS PCT: 1 %
Basophils Absolute: 76 cells/uL (ref 0–200)
EOS PCT: 2 %
Eosinophils Absolute: 152 cells/uL (ref 15–500)
HCT: 45.7 % — ABNORMAL HIGH (ref 35.0–45.0)
Hemoglobin: 15.4 g/dL (ref 11.7–15.5)
Lymphocytes Relative: 31 %
Lymphs Abs: 2356 cells/uL (ref 850–3900)
MCH: 28.7 pg (ref 27.0–33.0)
MCHC: 33.7 g/dL (ref 32.0–36.0)
MCV: 85.3 fL (ref 80.0–100.0)
MONOS PCT: 6 %
MPV: 9.8 fL (ref 7.5–12.5)
Monocytes Absolute: 456 cells/uL (ref 200–950)
NEUTROS ABS: 4560 {cells}/uL (ref 1500–7800)
Neutrophils Relative %: 60 %
PLATELETS: 392 10*3/uL (ref 140–400)
RBC: 5.36 MIL/uL — ABNORMAL HIGH (ref 3.80–5.10)
RDW: 13.6 % (ref 11.0–15.0)
WBC: 7.6 10*3/uL (ref 3.8–10.8)

## 2015-12-09 LAB — BASIC METABOLIC PANEL WITH GFR
BUN: 27 mg/dL — ABNORMAL HIGH (ref 7–25)
CHLORIDE: 100 mmol/L (ref 98–110)
CO2: 28 mmol/L (ref 20–31)
Calcium: 9.5 mg/dL (ref 8.6–10.4)
Creat: 1.05 mg/dL — ABNORMAL HIGH (ref 0.50–0.99)
GFR, EST NON AFRICAN AMERICAN: 55 mL/min — AB (ref >=60)
GFR, Est African American: 63 mL/min (ref >=60)
Glucose, Bld: 105 mg/dL — ABNORMAL HIGH (ref 65–99)
POTASSIUM: 4.4 mmol/L (ref 3.5–5.3)
SODIUM: 140 mmol/L (ref 135–146)

## 2015-12-09 LAB — HEPATIC FUNCTION PANEL
ALBUMIN: 4.4 g/dL (ref 3.6–5.1)
ALK PHOS: 70 U/L (ref 33–130)
ALT: 11 U/L (ref 6–29)
AST: 17 U/L (ref 10–35)
BILIRUBIN DIRECT: 0.1 mg/dL (ref <=0.2)
BILIRUBIN TOTAL: 0.4 mg/dL (ref 0.2–1.2)
Indirect Bilirubin: 0.3 mg/dL (ref 0.2–1.2)
TOTAL PROTEIN: 7.1 g/dL (ref 6.1–8.1)

## 2015-12-09 LAB — LIPID PANEL
Cholesterol: 217 mg/dL — ABNORMAL HIGH (ref 125–200)
HDL: 63 mg/dL (ref >=46)
LDL CALC: 115 mg/dL (ref <130)
TRIGLYCERIDES: 193 mg/dL — AB (ref <150)
Total CHOL/HDL Ratio: 3.4 Ratio (ref <=5.0)
VLDL: 39 mg/dL — ABNORMAL HIGH (ref <30)

## 2015-12-09 LAB — MAGNESIUM: MAGNESIUM: 2.2 mg/dL (ref 1.5–2.5)

## 2015-12-09 LAB — TSH: TSH: 2.08 m[IU]/L

## 2015-12-09 MED ORDER — LEVOTHYROXINE SODIUM 50 MCG PO TABS: 50.0000 ug | ORAL_TABLET | Freq: Every day | ORAL | 0 refills | Status: DC

## 2015-12-09 MED ORDER — METFORMIN HCL ER 500 MG PO TB24: ORAL_TABLET | ORAL | 0 refills | Status: DC

## 2015-12-09 MED ORDER — GLIPIZIDE 5 MG PO TABS: ORAL_TABLET | ORAL | 0 refills | Status: DC

## 2015-12-09 MED ORDER — LISINOPRIL-HYDROCHLOROTHIAZIDE 20-25 MG PO TABS: 1.0000 | ORAL_TABLET | Freq: Every day | ORAL | 0 refills | Status: DC

## 2015-12-09 MED ORDER — ATORVASTATIN CALCIUM 10 MG PO TABS: 10.0000 mg | ORAL_TABLET | Freq: Every day | ORAL | 1 refills | Status: DC

## 2015-12-09 MED ORDER — AMLODIPINE BESYLATE 5 MG PO TABS: 5.0000 mg | ORAL_TABLET | Freq: Every day | ORAL | 3 refills | Status: DC

## 2015-12-09 NOTE — Patient Instructions (Addendum)
Belsomra is the newer sleep medication which helps you fall asleep and stay sleep.  The other sleep medication is lunesta.  This is generic and is much less expensive.

## 2015-12-09 NOTE — Progress Notes (Signed)
Complete Physical  Assessment and Plan:   1. Encounter for general adult medical examination with abnormal findings -due next year  2. Need for prophylactic vaccination against Streptococcus pneumoniae (pneumococcus)  - Pneumococcal conjugate vaccine 13-valent  3. Need for prophylactic vaccination and inoculation against influenza  - Flu vaccine HIGH DOSE PF (Fluzone High dose)  4. Colon cancer screening  - Ambulatory referral to Gastroenterology  5. Essential hypertension -well controlled -cont current regimen - amLODipine (NORVASC) 5 MG tablet; Take 1 tablet (5 mg total) by mouth daily.  Dispense: 90 tablet; Refill: 3 - Urinalysis, Routine w reflex microscopic (not at [facility]) - Microalbumin / creatinine urine ratio - EKG 12-Lead - TSH  6. Mixed hyperlipidemia  - atorvastatin (LIPITOR) 10 MG tablet; Take 1 tablet (10 mg total) by mouth daily.  Dispense: 90 tablet; Refill: 1 - Lipid panel  7. Type 2 diabetes mellitus with stage 2 chronic kidney disease, without long-term current use of insulin (HCC)  - Hemoglobin A1c - Insulin, random  8. Hypothyroidism, unspecified hypothyroidism type -cont levothyroxine -tsh  9. Cervical radiculopathy at C6 -followed by ortho with Dr. Yevette Edwards  10. Vitamin D deficiency -cont Vit D - VITAMIN D 25 Hydroxy (Vit-D Deficiency, Fractures)  11. Medication management  - CBC with Differential/Platelet - BASIC METABOLIC PANEL WITH GFR - Hepatic function panel - Magnesium  12. Skin lesion of cheek -possible basal cell carcinoma - Ambulatory referral to Dermatology   Discussed med's effects and SE's. Screening labs and tests as requested with regular follow-up as recommended.  HPI  70 y.o. female  presents for a complete physical.  Her blood pressure has been controlled at home, today their BP is BP: 122/70.  She does workout. She denies chest pain, shortness of breath, dizziness. She has had a cardiac workup in the past with  Dr. Clifton James.    She is on cholesterol medication and denies myalgias. Her cholesterol is at goal. The cholesterol last visit was:  Lab Results  Component Value Date   CHOL 145 04/22/2015   HDL 63 04/22/2015   LDLCALC 61 04/22/2015   TRIG 106 04/22/2015   CHOLHDL 2.3 04/22/2015  .  She has been working on diet and exercise for diabetes, she is not on bASA, she is on ACE/ARB and denies foot ulcerations, hyperglycemia, hypoglycemia , increased appetite, nausea, paresthesia of the feet, polydipsia, polyuria, visual disturbances, vomiting and weight loss. Last A1C in the office was:  Lab Results  Component Value Date   HGBA1C 6.5 (H) 04/22/2015  She reports that on average she runs 103 to the 120s.  She has never run less than 90.    Patient is on Vitamin D supplement.   Lab Results  Component Value Date   VD25OH 40 04/22/2015     She is due for a colonoscopy.  She has previously seen Dr. Loreta Ave.    She reports that she has had a mammogram, but it was several years ago. She does need to have an updated one.    She has not had her eyes check recently.  She reports that it has been probably around 2.5 years.    She would like to have a sleep medication for her travels.   Current Medications:  Current Outpatient Prescriptions on File Prior to Visit  Medication Sig Dispense Refill  . amLODipine (NORVASC) 5 MG tablet Take 1 tablet (5 mg total) by mouth daily. 90 tablet 3  . atorvastatin (LIPITOR) 10 MG tablet Take 1 tablet (10  mg total) by mouth daily. 90 tablet 1  . Biotin 10 MG TABS Take by mouth daily.    . Calcium Carbonate-Vitamin D (CALCIUM-VITAMIN D) 500-200 MG-UNIT per tablet Take 1 tablet by mouth 2 (two) times daily with a meal.    . CINNAMON PO Take 1,000 Units by mouth daily. 2 capsules daily    . glipiZIDE (GLUCOTROL) 5 MG tablet Take 1 tablet by mouth  twice a day before meals 180 tablet 0  . levothyroxine (SYNTHROID, LEVOTHROID) 50 MCG tablet Take 1 tablet by mouth  daily  90 tablet 0  . lisinopril-hydrochlorothiazide (PRINZIDE,ZESTORETIC) 20-25 MG tablet Take 1 tablet by mouth  daily 90 tablet 0  . Magnesium 250 MG TABS Take 250 mg by mouth daily.    . metFORMIN (GLUCOPHAGE-XR) 500 MG 24 hr tablet Take 2 tablets by mouth  twice a day with meals for  diabetes 360 tablet 0  . Omega-3 Fatty Acids (FISH OIL) 1000 MG CAPS Take by mouth daily.    . traMADol (ULTRAM) 50 MG tablet Take 1 tablet 4 x day if needed for severe pain 50 tablet 1   No current facility-administered medications on file prior to visit.     Health Maintenance:   Immunization History  Administered Date(s) Administered  . Influenza Split 02/13/2013  . Influenza, High Dose Seasonal PF 12/07/2014, 12/09/2015  . Influenza-Unspecified 03/26/2014  . Pneumococcal Conjugate-13 12/09/2015  . Pneumococcal-Unspecified 03/23/2008  . Tdap 07/17/2010  . Zoster 01/14/2012    Tetanus:2012 Pneumovax:2010 Flu vaccine:2017 Zostavax:2013 MGM: 2013 DEXA:2013 Colonoscopy: 2006 Last Eye Exam: 2 years ago.    Patient Care Team: Lucky CowboyWilliam McKeown, MD as PCP - General (Internal Medicine) Kathleene Hazelhristopher D McAlhany, MD as Consulting Physician (Cardiology) Charna ElizabethJyothi Mann, MD as Consulting Physician (Gastroenterology) Estill BambergMark Dumonski, MD as Consulting Physician (Orthopedic Surgery) Lacretia NicksW Varney Baasonald Neal, MD as Consulting Physician (Obstetrics and Gynecology)  Allergies: No Known Allergies  Medical History:  Past Medical History:  Diagnosis Date  . Diabetes mellitus   . Headache(784.0)    hx migraines  . Hyperlipidemia   . Hypertension   . Hypothyroidism     Surgical History:  Past Surgical History:  Procedure Laterality Date  . ANTERIOR CERVICAL DECOMP/DISCECTOMY FUSION  10/22/2011   Procedure: ANTERIOR CERVICAL DECOMPRESSION/DISCECTOMY FUSION 1 LEVEL;  Surgeon: Emilee HeroMark Leonard Dumonski, MD;  [facility];  Service: Orthopedics;  Laterality: Right;  Anterior cervical decompression fusion, cervical 5-6 with  instrumentation and allograft.  Marland Kitchen. BREAST SURGERY  80's   lft cyst    Family History:  Family History  Problem Relation Age of Onset  . Hypertension Mother   . Hyperlipidemia Mother   . Heart disease Mother   . Drug abuse Maternal Grandmother     Social History:  Social History  Substance Use Topics  . Smoking status: Never Smoker  . Smokeless tobacco: Not on file     Comment: occ social  . Alcohol use 3.5 oz/week    7 drink(s) per week    Review of Systems: Review of Systems  Constitutional: Negative for chills, fever and malaise/fatigue.  HENT: Negative for congestion, ear pain and sore throat.   Eyes: Negative.   Respiratory: Negative for cough, shortness of breath and wheezing.   Cardiovascular: Negative for chest pain, palpitations and leg swelling.  Gastrointestinal: Negative for abdominal pain, blood in stool, constipation, diarrhea, heartburn and melena.  Genitourinary: Negative.   Skin: Negative.   Neurological: Negative for dizziness, sensory change, loss of consciousness and headaches.  Psychiatric/Behavioral:  Negative for depression. The patient is not nervous/anxious and does not have insomnia.     Physical Exam: Estimated body mass index is 25.26 kg/m as calculated from the following:   Height as of this encounter: 5' 3.75" (1.619 m).   Weight as of this encounter: 146 lb (66.2 kg). BP 122/70   Pulse 60   Temp 98 F (36.7 C) (Temporal)   Resp 16   Ht 5' 3.75" (1.619 m)   Wt 146 lb (66.2 kg)   BMI 25.26 kg/m   General Appearance: Well nourished well developed, in no apparent distress.  Eyes: PERRLA, EOMs, conjunctiva no swelling or erythema ENT/Mouth: Ear canals normal without obstruction, swelling, erythema, or discharge.  TMs normal bilaterally with no erythema, bulging, retraction, or loss of landmark.  Oropharynx moist and clear with no exudate, erythema, or swelling.   Neck: Supple, thyroid normal. No bruits.  No cervical  adenopathy Respiratory: Respiratory effort normal, Breath sounds clear A&P without wheeze, rhonchi, rales.   Cardio: RRR without murmurs, rubs or gallops. Brisk peripheral pulses without edema.  Chest: symmetric, with normal excursions Breasts: Symmetric, without lumps, nipple discharge, retractions.  Abdomen: Soft, nontender, no guarding, rebound, hernias, masses, or organomegaly.  Lymphatics: Non tender without lymphadenopathy.  Musculoskeletal: Full ROM all peripheral extremities,5/5 strength, and normal gait.  Skin: Warm, dry without rashes, lesions, ecchymosis. 1 cm irregular shaped umbilicated lesion to the left nasal bone spreading laterally to the medial portion of the cheek.  Some darkening and scabbing to the lesion.  No telangectasia Neuro: Awake and oriented X 3, Cranial nerves intact, reflexes equal bilaterally. Normal muscle tone, no cerebellar symptoms. Sensation intact.  Psych:  normal affect, Insight and Judgment appropriate.   EKG: Bradycardia with RBBB, no changes from prior  Over 40 minutes of exam, counseling, chart review and critical decision making was performed  Terri Piedra 10:34 AM [facility]

## 2015-12-10 LAB — HEMOGLOBIN A1C
HEMOGLOBIN A1C: 6.4 % — AB (ref <5.7)
Mean Plasma Glucose: 137 mg/dL

## 2015-12-10 LAB — URINALYSIS, ROUTINE W REFLEX MICROSCOPIC
BILIRUBIN URINE: NEGATIVE
Glucose, UA: NEGATIVE
HGB URINE DIPSTICK: NEGATIVE
KETONES UR: NEGATIVE
Leukocytes, UA: NEGATIVE
NITRITE: NEGATIVE
PROTEIN: NEGATIVE
SPECIFIC GRAVITY, URINE: 1.022 (ref 1.001–1.035)
pH: 5.5 (ref 5.0–8.0)

## 2015-12-10 LAB — MICROALBUMIN / CREATININE URINE RATIO
CREATININE, URINE: 164 mg/dL (ref 20–320)
MICROALB UR: 0.6 mg/dL
Microalb Creat Ratio: 4 mcg/mg creat (ref <30)

## 2015-12-10 LAB — INSULIN, RANDOM: INSULIN: 4.1 u[IU]/mL (ref 2.0–19.6)

## 2015-12-10 LAB — VITAMIN D 25 HYDROXY (VIT D DEFICIENCY, FRACTURES): VIT D 25 HYDROXY: 50 ng/mL (ref 30–100)

## 2015-12-11 ENCOUNTER — Other Ambulatory Visit: Payer: Self-pay | Admitting: *Deleted

## 2015-12-11 ENCOUNTER — Encounter: Payer: Self-pay | Admitting: *Deleted

## 2015-12-11 MED ORDER — ESZOPICLONE 3 MG PO TABS: 3.0000 mg | ORAL_TABLET | Freq: Every evening | ORAL | 2 refills | Status: DC | PRN

## 2016-01-27 ENCOUNTER — Other Ambulatory Visit: Payer: Self-pay | Admitting: Internal Medicine

## 2016-04-13 ENCOUNTER — Other Ambulatory Visit: Payer: Self-pay | Admitting: Internal Medicine

## 2016-04-29 ENCOUNTER — Other Ambulatory Visit: Payer: Self-pay | Admitting: Internal Medicine

## 2016-04-29 ENCOUNTER — Ambulatory Visit (INDEPENDENT_AMBULATORY_CARE_PROVIDER_SITE_OTHER): Payer: Medicare Other | Admitting: Internal Medicine

## 2016-04-29 ENCOUNTER — Encounter: Payer: Self-pay | Admitting: Internal Medicine

## 2016-04-29 VITALS — BP 120/80 | HR 64 | Temp 97.3°F | Resp 16 | Ht 63.75 in | Wt 155.8 lb

## 2016-04-29 DIAGNOSIS — Z79899 Other long term (current) drug therapy: Secondary | ICD-10-CM

## 2016-04-29 DIAGNOSIS — E1122 Type 2 diabetes mellitus with diabetic chronic kidney disease: Secondary | ICD-10-CM | POA: Diagnosis not present

## 2016-04-29 DIAGNOSIS — E782 Mixed hyperlipidemia: Secondary | ICD-10-CM

## 2016-04-29 DIAGNOSIS — I10 Essential (primary) hypertension: Secondary | ICD-10-CM | POA: Diagnosis not present

## 2016-04-29 DIAGNOSIS — E039 Hypothyroidism, unspecified: Secondary | ICD-10-CM

## 2016-04-29 DIAGNOSIS — N182 Chronic kidney disease, stage 2 (mild): Secondary | ICD-10-CM | POA: Diagnosis not present

## 2016-04-29 DIAGNOSIS — E559 Vitamin D deficiency, unspecified: Secondary | ICD-10-CM

## 2016-04-29 LAB — BASIC METABOLIC PANEL WITH GFR
BUN: 24 mg/dL (ref 7–25)
CHLORIDE: 104 mmol/L (ref 98–110)
CO2: 27 mmol/L (ref 20–31)
CREATININE: 1.17 mg/dL — AB (ref 0.50–0.99)
Calcium: 9.9 mg/dL (ref 8.6–10.4)
GFR, EST NON AFRICAN AMERICAN: 48 mL/min — AB (ref >=60)
GFR, Est African American: 55 mL/min — ABNORMAL LOW (ref >=60)
Glucose, Bld: 212 mg/dL — ABNORMAL HIGH (ref 65–99)
POTASSIUM: 5.3 mmol/L (ref 3.5–5.3)
SODIUM: 140 mmol/L (ref 135–146)

## 2016-04-29 LAB — CBC WITH DIFFERENTIAL/PLATELET
BASOS PCT: 0 %
Basophils Absolute: 0 cells/uL (ref 0–200)
EOS PCT: 1 %
Eosinophils Absolute: 104 cells/uL (ref 15–500)
HCT: 42 % (ref 35.0–45.0)
Hemoglobin: 13.7 g/dL (ref 11.7–15.5)
Lymphocytes Relative: 22 %
Lymphs Abs: 2288 cells/uL (ref 850–3900)
MCH: 28.5 pg (ref 27.0–33.0)
MCHC: 32.6 g/dL (ref 32.0–36.0)
MCV: 87.5 fL (ref 80.0–100.0)
MONOS PCT: 6 %
MPV: 10.1 fL (ref 7.5–12.5)
Monocytes Absolute: 624 cells/uL (ref 200–950)
Neutro Abs: 7384 cells/uL (ref 1500–7800)
Neutrophils Relative %: 71 %
PLATELETS: 402 10*3/uL — AB (ref 140–400)
RBC: 4.8 MIL/uL (ref 3.80–5.10)
RDW: 13.5 % (ref 11.0–15.0)
WBC: 10.4 10*3/uL (ref 3.8–10.8)

## 2016-04-29 LAB — LIPID PANEL
CHOL/HDL RATIO: 3 ratio (ref <5.0)
Cholesterol: 152 mg/dL (ref <200)
HDL: 50 mg/dL — AB (ref >50)
LDL Cholesterol: 60 mg/dL (ref <100)
Triglycerides: 208 mg/dL — ABNORMAL HIGH (ref <150)
VLDL: 42 mg/dL — ABNORMAL HIGH (ref <30)

## 2016-04-29 LAB — HEPATIC FUNCTION PANEL
ALK PHOS: 71 U/L (ref 33–130)
ALT: 13 U/L (ref 6–29)
AST: 16 U/L (ref 10–35)
Albumin: 4.2 g/dL (ref 3.6–5.1)
BILIRUBIN DIRECT: 0.1 mg/dL (ref <=0.2)
BILIRUBIN TOTAL: 0.3 mg/dL (ref 0.2–1.2)
Indirect Bilirubin: 0.2 mg/dL (ref 0.2–1.2)
Total Protein: 6.7 g/dL (ref 6.1–8.1)

## 2016-04-29 LAB — TSH: TSH: 1.62 m[IU]/L

## 2016-04-29 NOTE — Progress Notes (Signed)
[facility] Elizabeth May, M.D.        Elizabeth CarrelAmanda R. Elizabeth May, P.A.-C       Elizabeth May, P.A.-C  [facility]                [address]                [name], [name] ______________________________________________________________________     This very nice 71 y.o. MWF presents for quarterly  follow up with Hypertension, Hyperlipidemia, T2_NIDDM and Vitamin D Deficiency.      Patient is treated for HTN (2007) & BP has been controlled at home. Today's BP is at goal - 120/80. Patient has had no complaints of any cardiac type chest pain, palpitations, dyspnea/orthopnea/PND, dizziness, claudication, or dependent edema.     Hyperlipidemia is not controlled with diet & she is very Journalist, newspapercavalier wrt diet.  Last Lipids were not at goal: Lab Results  Component Value Date   CHOL 217 (H) 12/09/2015   HDL 63 12/09/2015   LDLCALC 115 12/09/2015   TRIG 193 (H) 12/09/2015   CHOLHDL 3.4 12/09/2015      Also, the patient has history of T2_NIDDM (2006) w/CKD2 and has had no symptoms of reactive hypoglycemia, diabetic polys, paresthesias or visual blurring.  She reports CBG's have been running 90-140 mg%.  A1c was 7.7% in Sept 2016. Last A1c was not at goal: Lab Results  Component Value Date   HGBA1C 6.4 (H) 12/09/2015      Further, the patient also has history of Vitamin D Deficiency and supplements vitamin D without any suspected side-effects. Last vitamin D was still slightly low (goal 70-100):  Lab Results  Component Value Date   VD25OH 50 12/09/2015   Current Outpatient Prescriptions on File Prior to Visit  Medication Sig  . amLODipine (NORVASC) 5 MG tablet Take 1 tablet (5 mg total) by mouth daily.  . Biotin 10 MG TABS Take by mouth daily.  . Calcium Carbonate-Vitamin D (CALCIUM-VITAMIN D) 500-200 MG-UNIT per tablet Take 1 tablet by mouth 2 (two) times daily with a meal.   . CINNAMON PO Take 1,000 Units by mouth daily. 2 capsules daily  . levothyroxine (SYNTHROID, LEVOTHROID) 50 MCG tablet Take 1 tablet every morning on an empty stomach with only water for 30 minutes - need Office Visit before refill  . lisinopril-hydrochlorothiazide (PRINZIDE,ZESTORETIC) 20-25 MG tablet Take 1 tablet daily for BP - Need Office Visit for Refill  . Magnesium 250 MG TABS Take 250 mg by mouth daily.  . Omega-3 Fatty Acids (FISH OIL) 1000 MG CAPS Take by mouth daily.  . traMADol (ULTRAM) 50 MG tablet Take 1 tablet 4 x day if needed for severe pain   No current facility-administered medications on file prior to visit.    No Known Allergies PMHx:   Past Medical History:  Diagnosis Date  . Diabetes mellitus   . Headache(784.0)    hx migraines  . Hyperlipidemia   . Hypertension   . Hypothyroidism    Immunization History  Administered Date(s) Administered  . Influenza Split 02/13/2013  . Influenza, High Dose Seasonal PF 12/07/2014, 12/09/2015  . Influenza-Unspecified 03/26/2014  . Pneumococcal Conjugate-13 12/09/2015  . Pneumococcal-Unspecified 03/23/2008  . Tdap 07/17/2010  . Zoster 01/14/2012   Past Surgical History:  Procedure Laterality Date  . ANTERIOR CERVICAL DECOMP/DISCECTOMY FUSION  10/22/2011   Procedure: ANTERIOR CERVICAL DECOMPRESSION/DISCECTOMY FUSION 1 LEVEL;  Surgeon: Emilee HeroMark Leonard Dumonski,  MD;  Location: [facility] OR;  Service: Orthopedics;  Laterality: Right;  Anterior cervical decompression fusion, cervical 5-6 with instrumentation and allograft.  Marland Kitchen BREAST SURGERY  80's   lft cyst   FHx:    Reviewed / unchanged  SHx:    Reviewed / unchanged  Systems Review:  Constitutional: Denies fever, chills, wt changes, headaches, insomnia, fatigue, night sweats, change in appetite. Eyes: Denies redness, blurred vision, diplopia, discharge, itchy, watery eyes.  ENT: Denies discharge, congestion, post nasal drip, epistaxis, sore throat, earache, hearing loss, dental  pain, tinnitus, vertigo, sinus pain, snoring.  CV: Denies chest pain, palpitations, irregular heartbeat, syncope, dyspnea, diaphoresis, orthopnea, PND, claudication or edema. Respiratory: denies cough, dyspnea, DOE, pleurisy, hoarseness, laryngitis, wheezing.  Gastrointestinal: Denies dysphagia, odynophagia, heartburn, reflux, water brash, abdominal pain or cramps, nausea, vomiting, bloating, diarrhea, constipation, hematemesis, melena, hematochezia  or hemorrhoids. Genitourinary: Denies dysuria, frequency, urgency, nocturia, hesitancy, discharge, hematuria or flank pain. Musculoskeletal: Denies arthralgias, myalgias, stiffness, jt. swelling, pain, limping or strain/sprain.  Skin: Denies pruritus, rash, hives, warts, acne, eczema or change in skin lesion(s). Neuro: No weakness, tremor, incoordination, spasms, paresthesia or pain. Psychiatric: Denies confusion, memory loss or sensory loss. Endo: Denies change in weight, skin or hair change.  Heme/Lymph: No excessive bleeding, bruising or enlarged lymph nodes.  Physical Exam  BP 120/80   Pulse 64   Temp 97.3 F (36.3 C)   Resp 16   Ht 5' 3.75" (1.619 m)   Wt 155 lb 12.8 oz (70.7 kg)   BMI 26.95 kg/m   Appears well nourished and in no distress.  Eyes: PERRLA, EOMs, conjunctiva no swelling or erythema. Sinuses: No frontal/maxillary tenderness ENT/Mouth: EAC's clear, TM's nl w/o erythema, bulging. Nares clear w/o erythema, swelling, exudates. Oropharynx clear without erythema or exudates. Oral hygiene is good. Tongue normal, non obstructing. Hearing intact.  Neck: Supple. Thyroid nl. Car 2+/2+ without bruits, nodes or JVD. Chest: Respirations nl with BS clear & equal w/o rales, rhonchi, wheezing or stridor.  Cor: Heart sounds normal w/ regular rate and rhythm without sig. murmurs, gallops, clicks, or rubs. Peripheral pulses normal and equal  without edema.  Abdomen: Soft & bowel sounds normal. Non-tender w/o guarding, rebound, hernias,  masses, or organomegaly.  Lymphatics: Unremarkable.  Musculoskeletal: Full ROM all peripheral extremities, joint stability, 5/5 strength, and normal gait.  Skin: Warm, dry without exposed rashes, lesions or ecchymosis apparent.  Neuro: Cranial nerves intact, reflexes equal bilaterally. Sensory-motor testing grossly intact. Tendon reflexes grossly intact.  Pysch: Alert & oriented x 3.  Insight and judgement nl & appropriate. No ideations.  Assessment and Plan:  1. Essential hypertension  - Continue medication, monitor blood pressure at home.  - Continue DASH diet. Reminder to go to the ER if any CP,  SOB, nausea, dizziness, severe HA, changes vision/speech,  left arm numbness and tingling and jaw pain.  - CBC with Differential/Platelet - BASIC METABOLIC PANEL WITH GFR - TSH  2. Mixed hyperlipidemia  - Continue diet/meds, exercise,& lifestyle modifications.  - Continue monitor periodic cholesterol/liver & renal functions   - Hepatic function panel - Lipid panel - TSH  3. Type 2 diabetes mellitus with stage 2 chronic kidney disease, without long-term current use of insulin (HCC)  - Continue diet, exercise, lifestyle modifications.  - Monitor appropriate labs.  - Hemoglobin A1c - Insulin, random  4. Vitamin D deficiency  - Continue supplementation.   - VITAMIN D 25 Hydroxy   5. Hypothyroidism  - TSH  6. Medication management  -  CBC with Differential/Platelet - BASIC METABOLIC PANEL WITH GFR - Hepatic function panel - Magnesium - Lipid panel - TSH - Hemoglobin A1c - Insulin, random - VITAMIN D 25 Hydroxy         Recommended regular exercise, BP monitoring, weight control, and discussed med and SE's. Recommended labs to assess and monitor clinical status. Further disposition pending results of labs. Over 30 minutes of exam, counseling, chart review was performed

## 2016-04-29 NOTE — Patient Instructions (Signed)

## 2016-04-30 LAB — MAGNESIUM: MAGNESIUM: 2.1 mg/dL (ref 1.5–2.5)

## 2016-04-30 LAB — VITAMIN D 25 HYDROXY (VIT D DEFICIENCY, FRACTURES): Vit D, 25-Hydroxy: 52 ng/mL (ref 30–100)

## 2016-04-30 LAB — HEMOGLOBIN A1C
Hgb A1c MFr Bld: 6.6 % — ABNORMAL HIGH (ref <5.7)
Mean Plasma Glucose: 143 mg/dL

## 2016-04-30 LAB — INSULIN, RANDOM: Insulin: 11.9 u[IU]/mL (ref 2.0–19.6)

## 2016-06-24 ENCOUNTER — Encounter: Payer: Self-pay | Admitting: Internal Medicine

## 2016-06-24 ENCOUNTER — Ambulatory Visit (INDEPENDENT_AMBULATORY_CARE_PROVIDER_SITE_OTHER): Payer: Medicare Other | Admitting: Internal Medicine

## 2016-06-24 VITALS — BP 128/70 | HR 74 | Temp 98.2°F | Resp 16 | Ht 63.75 in | Wt 152.0 lb

## 2016-06-24 DIAGNOSIS — J069 Acute upper respiratory infection, unspecified: Secondary | ICD-10-CM

## 2016-06-24 MED ORDER — DEXAMETHASONE SODIUM PHOSPHATE 10 MG/ML IJ SOLN
10.0000 mg | Freq: Once | INTRAMUSCULAR | Status: AC
  Administered 2016-06-24: 10 mg via INTRAMUSCULAR

## 2016-06-24 MED ORDER — AZITHROMYCIN 250 MG PO TABS: ORAL_TABLET | ORAL | 0 refills | Status: DC

## 2016-06-24 MED ORDER — TRAZODONE HCL 150 MG PO TABS: 150.0000 mg | ORAL_TABLET | Freq: Every day | ORAL | 2 refills | Status: DC

## 2016-06-24 MED ORDER — PROMETHAZINE-DM 6.25-15 MG/5ML PO SYRP: ORAL_SOLUTION | ORAL | 1 refills | Status: DC

## 2016-06-24 MED ORDER — FLUTICASONE PROPIONATE 50 MCG/ACT NA SUSP: 2.0000 | Freq: Every day | NASAL | 0 refills | Status: DC

## 2016-06-24 MED ORDER — ALBUTEROL SULFATE HFA 108 (90 BASE) MCG/ACT IN AERS: 2.0000 | INHALATION_SPRAY | Freq: Four times a day (QID) | RESPIRATORY_TRACT | 0 refills | Status: DC | PRN

## 2016-06-24 NOTE — Progress Notes (Signed)
HPI  Patient presents to the office for evaluation of cough.  It has been going on for 1 weeks.  Patient reports night > day, wet, worse with lying down.  They also endorse change in voice, postnasal drip, shortness of breath and clear rhinorrhea, sinus pressure, sore throat, ear pain L>R..  They have tried antitussives.  They report that nothing has worked.  They admits to other sick contacts.  She reports that her husband has had very similar symptoms. She regularly takes loratidine.  She is not taking nasal sprays.  Review of Systems  Constitutional: Positive for malaise/fatigue. Negative for chills and fever.  HENT: Positive for congestion, ear pain, hearing loss and sore throat.   Respiratory: Positive for cough. Negative for sputum production, shortness of breath and wheezing.   Cardiovascular: Negative for chest pain, palpitations and leg swelling.  Neurological: Positive for headaches.    PE:  Vitals:   06/24/16 1513  BP: 128/70  Pulse: 74  Resp: 16  Temp: 98.2 F (36.8 C)    General:  Alert and non-toxic, WDWN, NAD HEENT: NCAT, PERLA, EOM normal, no occular discharge or erythema.  Nasal mucosal edema with sinus tenderness to palpation.  Oropharynx clear with minimal oropharyngeal edema and erythema.  Mucous membranes moist and pink. Neck:  Cervical adenopathy Chest:  RRR no MRGs.  Lungs clear to auscultation A&P with no wheezes rhonchi or rales.   Abdomen: +BS x 4 quadrants, soft, non-tender, no guarding, rigidity, or rebound. Skin: warm and dry no rash Neuro: A&Ox4, CN II-XII grossly intact  Assessment and Plan:   1. Acute URI -flonase -zpak -phenergan dm -nasal saline -albuterol -recheck as needed -increase fluid intake -elevate HOB at bedtime - dexamethasone (DECADRON) injection 10 mg; Inject 1 mL (10 mg total) into the muscle once.

## 2016-06-30 ENCOUNTER — Encounter: Payer: Self-pay | Admitting: Internal Medicine

## 2016-06-30 ENCOUNTER — Ambulatory Visit (INDEPENDENT_AMBULATORY_CARE_PROVIDER_SITE_OTHER): Payer: Medicare Other | Admitting: Internal Medicine

## 2016-06-30 VITALS — BP 132/78 | HR 74 | Temp 98.4°F | Resp 16 | Ht 63.75 in

## 2016-06-30 DIAGNOSIS — J069 Acute upper respiratory infection, unspecified: Secondary | ICD-10-CM

## 2016-06-30 MED ORDER — AZELASTINE HCL 0.1 % NA SOLN: 2.0000 | Freq: Two times a day (BID) | NASAL | 2 refills | Status: DC

## 2016-06-30 MED ORDER — AZITHROMYCIN 250 MG PO TABS: ORAL_TABLET | ORAL | 0 refills | Status: DC

## 2016-06-30 MED ORDER — RANITIDINE HCL 150 MG PO TABS: 150.0000 mg | ORAL_TABLET | Freq: Two times a day (BID) | ORAL | 1 refills | Status: DC

## 2016-06-30 MED ORDER — PREDNISONE 20 MG PO TABS: ORAL_TABLET | ORAL | 0 refills | Status: DC

## 2016-06-30 NOTE — Progress Notes (Signed)
Assessment and Plan:   1. Acute URI -improved since last visit.   -cont phenergan dm, flonase, nasal saline, and daily antihistmaine - azelastine (ASTELIN) 0.1 % nasal spray; Place 2 sprays into both nostrils 2 (two) times daily. Use in each nostril as directed  Dispense: 30 mL; Refill: 2 - ranitidine (ZANTAC) 150 MG tablet; Take 1 tablet (150 mg total) by mouth 2 (two) times daily.  Dispense: 60 tablet; Refill: 1 - predniSONE (DELTASONE) 20 MG tablet; 3 tabs po day one, then 2 tabs daily x 4 days  Dispense: 11 tablet; Refill: 0    HPI 71 y.o.female presents for recheck of URI.  She was seen on 06/24/16 for URI.  She reports that her chest congestion has somewhat improved and she is not coughing or hacking as much.  She is still having some mild congestion.  She reports that the phlegm is now clear.  She reports that she is not having fevers or chills.  She is still taking cough syrup, still taking claritin, and is still using the flonase.  She did get a decadron shot which helped.  She reports that this helped and then it got worse again.    Past Medical History:  Diagnosis Date  . Diabetes mellitus   . Headache(784.0)    hx migraines  . Hyperlipidemia   . Hypertension   . Hypothyroidism      No Known Allergies    Current Outpatient Prescriptions on File Prior to Visit  Medication Sig Dispense Refill  . albuterol (PROVENTIL HFA;VENTOLIN HFA) 108 (90 Base) MCG/ACT inhaler Inhale 2 puffs into the lungs every 6 (six) hours as needed for wheezing or shortness of breath (cough). 1 Inhaler 0  . amLODipine (NORVASC) 5 MG tablet Take 1 tablet (5 mg total) by mouth daily. 90 tablet 3  . atorvastatin (LIPITOR) 10 MG tablet TAKE 1 TABLET BY MOUTH  DAILY 90 tablet 0  . Biotin 10 MG TABS Take by mouth daily.    . Calcium Carbonate-Vitamin D (CALCIUM-VITAMIN D) 500-200 MG-UNIT per tablet Take 1 tablet by mouth 2 (two) times daily with a meal.    . CINNAMON PO Take 1,000 Units by mouth daily. 2  capsules daily    . fluticasone (FLONASE) 50 MCG/ACT nasal spray Place 2 sprays into both nostrils daily. 16 g 0  . glipiZIDE (GLUCOTROL) 5 MG tablet TAKE 1 TABLET BY MOUTH  TWICE A DAY BEFORE MEALS 180 tablet 1  . levothyroxine (SYNTHROID, LEVOTHROID) 50 MCG tablet Take 1 tablet every morning on an empty stomach with only water for 30 minutes - need Office Visit before refill 90 tablet 0  . lisinopril-hydrochlorothiazide (PRINZIDE,ZESTORETIC) 20-25 MG tablet Take 1 tablet daily for BP - Need Office Visit for Refill 90 tablet 0  . Magnesium 250 MG TABS Take 250 mg by mouth daily.    . metFORMIN (GLUCOPHAGE-XR) 500 MG 24 hr tablet TAKE 2 TABLETS BY MOUTH  TWICE A DAY WITH MEALS FOR  DIABETES 360 tablet 1  . Omega-3 Fatty Acids (FISH OIL) 1000 MG CAPS Take by mouth daily.    . promethazine-dextromethorphan (PROMETHAZINE-DM) 6.25-15 MG/5ML syrup Take 5-10 ML PO q8hrs prn for cough 360 mL 1  . traMADol (ULTRAM) 50 MG tablet Take 1 tablet 4 x day if needed for severe pain 50 tablet 1  . traZODone (DESYREL) 150 MG tablet Take 1 tablet (150 mg total) by mouth at bedtime. Take 1/2 to 1 tablet at bedtime as needed for sleep 90 tablet  2   No current facility-administered medications on file prior to visit.     ROS: all negative except above.   Physical Exam: There were no vitals filed for this visit. BP 132/78   Pulse 74   Temp 98.4 F (36.9 C) (Temporal)   Resp 16   Ht 5' 3.75" (1.619 m)   SpO2 96%  General Appearance: Well developed well nourished, non-toxic appearing in no apparent distress. Eyes: PERRLA, EOMs, conjunctiva w/ no swelling or erythema or discharge Sinuses: No Frontal/maxillary tenderness ENT/Mouth: Ear canals clear without swelling or erythema.  TM's normal bilaterally with no retractions, bulging, or loss of landmarks.   Neck: Supple, thyroid normal, no notable JVD  Respiratory: Respiratory effort normal, Clear breath sounds anteriorly and posteriorly bilaterally without  rales, rhonchi, wheezing or stridor. No retractions or accessory muscle usage. Cardio: RRR with no MRGs.   Abdomen: Soft, + BS.  Non tender, no guarding, rebound, hernias, masses.  Musculoskeletal: Full ROM, 5/5 strength, normal gait.  Skin: Warm, dry without rashes  Neuro: Awake and oriented X 3, Cranial nerves intact. Normal muscle tone, no cerebellar symptoms. Sensation intact.  Psych: normal affect, Insight and Judgment appropriate.     Terri Piedra, PA-C 3:04 PM [facility]

## 2016-07-01 ENCOUNTER — Other Ambulatory Visit: Payer: Self-pay | Admitting: Internal Medicine

## 2016-07-15 ENCOUNTER — Other Ambulatory Visit: Payer: Self-pay | Admitting: Internal Medicine

## 2016-07-15 DIAGNOSIS — E782 Mixed hyperlipidemia: Secondary | ICD-10-CM

## 2016-07-25 NOTE — Progress Notes (Deleted)
Patient ID: Elizabeth May, female   DOB: 1945/10/16, 71 y.o.   MRN: [id number]  Assessment and Plan:  Hypertension:  -Continue medication -monitor blood pressure at home. -Continue DASH diet -Reminder to go to the ER if any CP, SOB, nausea, dizziness, severe HA, changes vision/speech, left arm numbness and tingling and jaw pain. -BP elevated today.  If remains elevated after 2 weeks of BP log then we need to increase prinzide back to 1 whole tablet.  Patient states understanding.  Cholesterol - Continue diet and exercise -Check cholesterol.   Diabetes with diabetic chronic kidney disease -Continue diet and exercise.  -Check A1C  Vitamin D Def -check level -continue medications.   Continue diet and meds as discussed. Further disposition pending results of labs. Discussed med's effects and SE's.    HPI Elizabeth May y.o. female  presents for 3 month follow up with hypertension, hyperlipidemia, diabetes and vitamin D deficiency.   Her blood pressure has not been controlled at home, today their BP is  .She does workout.  She goes to the gym 5-6 times per week and does an hour on the treadmill. She denies chest pain, shortness of breath, dizziness.    She is on cholesterol medication and denies myalgias. Her cholesterol is at goal. The cholesterol was:  04/29/2016: Cholesterol 152; HDL 50; LDL Cholesterol 60; Triglycerides 208   She has been working on diet and exercise for diabetes with diabetic chronic kidney disease, she is not on bASA, she is on ACE/ARB, and denies  foot ulcerations, hyperglycemia, hypoglycemia , increased appetite, nausea, paresthesia of the feet, polydipsia, polyuria, visual disturbances, vomiting and weight loss. Last A1C was: 04/29/2016: Hgb A1c MFr Bld 6.6   Patient is on Vitamin D supplement. 04/29/2016: Vit D, 25-Hydroxy 52    Current Medications:  Current Outpatient Prescriptions on File Prior to Visit  Medication Sig Dispense Refill  . albuterol (PROVENTIL  HFA;VENTOLIN HFA) 108 (90 Base) MCG/ACT inhaler Inhale 2 puffs into the lungs every 6 (six) hours as needed for wheezing or shortness of breath (cough). 1 Inhaler 0  . amLODipine (NORVASC) 5 MG tablet Take 1 tablet (5 mg total) by mouth daily. 90 tablet 3  . atorvastatin (LIPITOR) 10 MG tablet TAKE 1 TABLET BY MOUTH  DAILY 90 tablet 0  . azelastine (ASTELIN) 0.1 % nasal spray Place 2 sprays into both nostrils 2 (two) times daily. Use in each nostril as directed 30 mL 2  . azithromycin (ZITHROMAX Z-PAK) 250 MG tablet 2 po day one, then 1 daily x 4 days 6 tablet 0  . Biotin 10 MG TABS Take by mouth daily.    . Calcium Carbonate-Vitamin D (CALCIUM-VITAMIN D) 500-200 MG-UNIT per tablet Take 1 tablet by mouth 2 (two) times daily with a meal.    . CINNAMON PO Take 1,000 Units by mouth daily. 2 capsules daily    . fluticasone (FLONASE) 50 MCG/ACT nasal spray Place 2 sprays into both nostrils daily. 16 g 0  . glipiZIDE (GLUCOTROL) 5 MG tablet TAKE 1 TABLET BY MOUTH  TWICE A DAY BEFORE MEALS 180 tablet 1  . levothyroxine (SYNTHROID, LEVOTHROID) 50 MCG tablet TAKE 1 TABLET EVERY MORNING ON AN EMPTY STOMACH WITH  ONLY WATER FOR 30 MINUTES - NEED OFFICE VISIT BEFORE  REFILL 90 tablet 0  . lisinopril-hydrochlorothiazide (PRINZIDE,ZESTORETIC) 20-25 MG tablet TAKE 1 TABLET DAILY FOR  BLOOD PRESSURE 90 tablet 0  . Magnesium 250 MG TABS Take 250 mg by mouth daily.    . metFORMIN (  GLUCOPHAGE-XR) 500 MG 24 hr tablet TAKE 2 TABLETS BY MOUTH  TWICE A DAY WITH MEALS FOR  DIABETES 360 tablet 1  . Omega-3 Fatty Acids (FISH OIL) 1000 MG CAPS Take by mouth daily.    . predniSONE (DELTASONE) 20 MG tablet 3 tabs po day one, then 2 tabs daily x 4 days 11 tablet 0  . promethazine-dextromethorphan (PROMETHAZINE-DM) 6.25-15 MG/5ML syrup Take 5-10 ML PO q8hrs prn for cough 360 mL 1  . ranitidine (ZANTAC) 150 MG tablet Take 1 tablet (150 mg total) by mouth 2 (two) times daily. 60 tablet 1  . traMADol (ULTRAM) 50 MG tablet Take 1  tablet 4 x day if needed for severe pain 50 tablet 1  . traZODone (DESYREL) 150 MG tablet Take 1 tablet (150 mg total) by mouth at bedtime. Take 1/2 to 1 tablet at bedtime as needed for sleep 90 tablet 2   No current facility-administered medications on file prior to visit.    Medical History:  Past Medical History:  Diagnosis Date  . Diabetes mellitus   . Headache(784.0)    hx migraines  . Hyperlipidemia   . Hypertension   . Hypothyroidism    Allergies: No Known Allergies   Review of Systems:  Review of Systems  Constitutional: Negative for chills, fever and malaise/fatigue.  HENT: Negative for congestion, ear pain, sore throat and tinnitus.   Eyes: Negative.   Respiratory: Negative for cough, sputum production, shortness of breath and wheezing.   Cardiovascular: Negative for chest pain, claudication and leg swelling.  Gastrointestinal: Negative for abdominal pain, blood in stool, constipation, diarrhea, heartburn, melena, nausea and vomiting.  Genitourinary: Negative for dysuria, flank pain, hematuria and urgency.  Musculoskeletal: Positive for neck pain.  Skin: Negative.   Neurological: Negative for dizziness, tremors, loss of consciousness and headaches.    Family history- Review and unchanged  Social history- Review and unchanged  Physical Exam: There were no vitals taken for this visit. Wt Readings from Last 3 Encounters:  06/24/16 152 lb (68.9 kg)  04/29/16 155 lb 12.8 oz (70.7 kg)  12/09/15 146 lb (66.2 kg)   General Appearance: Well nourished well developed, non-toxic appearing, in no apparent distress. Eyes: PERRLA, EOMs, conjunctiva no swelling or erythema ENT/Mouth: Ear canals clear with no erythema, swelling, or discharge.  TMs normal bilaterally, oropharynx clear, moist, with no exudate.   Neck: Supple, thyroid normal, no JVD, no cervical adenopathy.  Respiratory: Respiratory effort normal, breath sounds clear A&P, no wheeze, rhonchi or rales noted.  No  retractions, no accessory muscle usage Cardio: RRR with no MRGs. No noted edema.  Abdomen: Mildly obese, Soft, + BS.  Non tender, no guarding, rebound, hernias, masses. Musculoskeletal: Full ROM, 5/5 strength, Normal gait Skin: Warm, dry without rashes, lesions, ecchymosis.  Neuro: Awake and oriented X 3, Cranial nerves intact. No cerebellar symptoms.  Psych: normal affect, Insight and Judgment appropriate.    Quentin Mulling, PA-C 8:40 AM [facility]

## 2016-07-27 ENCOUNTER — Ambulatory Visit: Payer: Self-pay | Admitting: Physician Assistant

## 2016-07-30 ENCOUNTER — Ambulatory Visit (INDEPENDENT_AMBULATORY_CARE_PROVIDER_SITE_OTHER): Payer: Medicare Other | Admitting: Physician Assistant

## 2016-07-30 ENCOUNTER — Encounter: Payer: Self-pay | Admitting: Physician Assistant

## 2016-07-30 VITALS — BP 124/68 | HR 66 | Temp 97.9°F | Resp 16 | Ht 63.75 in | Wt 152.6 lb

## 2016-07-30 DIAGNOSIS — Z79899 Other long term (current) drug therapy: Secondary | ICD-10-CM

## 2016-07-30 DIAGNOSIS — E782 Mixed hyperlipidemia: Secondary | ICD-10-CM

## 2016-07-30 DIAGNOSIS — R6889 Other general symptoms and signs: Secondary | ICD-10-CM | POA: Diagnosis not present

## 2016-07-30 DIAGNOSIS — Z0001 Encounter for general adult medical examination with abnormal findings: Secondary | ICD-10-CM | POA: Diagnosis not present

## 2016-07-30 DIAGNOSIS — E1122 Type 2 diabetes mellitus with diabetic chronic kidney disease: Secondary | ICD-10-CM | POA: Diagnosis not present

## 2016-07-30 DIAGNOSIS — E559 Vitamin D deficiency, unspecified: Secondary | ICD-10-CM

## 2016-07-30 DIAGNOSIS — I10 Essential (primary) hypertension: Secondary | ICD-10-CM

## 2016-07-30 DIAGNOSIS — E039 Hypothyroidism, unspecified: Secondary | ICD-10-CM

## 2016-07-30 DIAGNOSIS — Z0000 Encounter for general adult medical examination without abnormal findings: Secondary | ICD-10-CM

## 2016-07-30 DIAGNOSIS — N182 Chronic kidney disease, stage 2 (mild): Secondary | ICD-10-CM | POA: Diagnosis not present

## 2016-07-30 DIAGNOSIS — M5412 Radiculopathy, cervical region: Secondary | ICD-10-CM | POA: Diagnosis not present

## 2016-07-30 LAB — CBC WITH DIFFERENTIAL/PLATELET
BASOS ABS: 0 {cells}/uL (ref 0–200)
Basophils Relative: 0 %
EOS ABS: 194 {cells}/uL (ref 15–500)
EOS PCT: 2 %
HCT: 42.6 % (ref 35.0–45.0)
Hemoglobin: 13.8 g/dL (ref 11.7–15.5)
Lymphocytes Relative: 21 %
Lymphs Abs: 2037 cells/uL (ref 850–3900)
MCH: 27.9 pg (ref 27.0–33.0)
MCHC: 32.4 g/dL (ref 32.0–36.0)
MCV: 86.1 fL (ref 80.0–100.0)
MPV: 9.8 fL (ref 7.5–12.5)
Monocytes Absolute: 485 cells/uL (ref 200–950)
Monocytes Relative: 5 %
NEUTROS ABS: 6984 {cells}/uL (ref 1500–7800)
NEUTROS PCT: 72 %
PLATELETS: 362 10*3/uL (ref 140–400)
RBC: 4.95 MIL/uL (ref 3.80–5.10)
RDW: 13.8 % (ref 11.0–15.0)
WBC: 9.7 10*3/uL (ref 3.8–10.8)

## 2016-07-30 LAB — TSH: TSH: 2.2 m[IU]/L

## 2016-07-30 NOTE — Progress Notes (Signed)
Patient ID: Elizabeth May, female   DOB: December 26, 1945, 71 y.o.   MRN: [id number]   MEDICARE ANNUAL WELLNESS VISIT AND 3 MONTH FOLLOW UP  Assessment:    Essential hypertension - continue medications, DASH diet, exercise and monitor at home. Call if greater than 130/80.  -     CBC with Differential/Platelet -     BASIC METABOLIC PANEL WITH GFR -     Hepatic function panel -     TSH  Type 2 diabetes mellitus with stage 2 chronic kidney disease, without long-term current use of insulin (HCC) Discussed general issues about diabetes pathophysiology and management., Educational material distributed., Suggested low cholesterol diet., Encouraged aerobic exercise., Discussed foot care., Reminded to get yearly retinal exam. -     Hemoglobin A1c  Hypothyroidism, unspecified type Hypothyroidism-check TSH level, continue medications the same, reminded to take on an empty stomach 30-48mins before food.  -     TSH  Cervical radiculopathy at C6 s/p fusion, takes tramadol PRN, mainly for travel  Mixed hyperlipidemia -continue medications, check lipids, decrease fatty foods, increase activity.  -     Lipid panel  Vitamin D deficiency  Medication management -     Magnesium  Encounter for Medicare annual wellness exam   Over 40 minutes of exam, counseling, chart review and critical decision making was performed Future Appointments Date Time Provider Department Center  12/15/2016 2:00 PM Elizabeth Mulling, PA-C [facility] None    Plan:   During the course of the visit the patient was educated and counseled about appropriate screening and preventive services including:    Pneumococcal vaccine   Prevnar 13  Influenza vaccine  Td vaccine  Screening electrocardiogram  Bone densitometry screening  Colorectal cancer screening  Diabetes screening  Glaucoma screening  Nutrition counseling   Advanced directives: requested  Subjective:  Elizabeth May is a 71 y.o. female who  presents for Medicare Annual Wellness Visit and 3 month follow up for HTN, DM, chol  She has had elevated blood pressure for  years. Her blood pressure has been controlled at home, today their BP is BP: 124/68 She does workout. She denies chest pain, shortness of breath, dizziness.  She does walk on a treadmill and checks blood pressure occasionally at home.   Travels a lot to [place].  She is on cholesterol medication and denies myalgias. Her cholesterol is at goal. The cholesterol last visit was:   Lab Results  Component Value Date   CHOL 152 04/29/2016   HDL 50 (L) 04/29/2016   LDLCALC 60 04/29/2016   TRIG 208 (H) 04/29/2016   CHOLHDL 3.0 04/29/2016   She has had diabetes controlled with metformin. She has been working on diet and exercise for diabetes, and denies foot ulcerations, hyperglycemia, hypoglycemia , increased appetite, nausea, paresthesia of the feet, polydipsia, polyuria, visual disturbances, vomiting and weight loss. Last A1C in the office was:  Lab Results  Component Value Date   HGBA1C 6.6 (H) 04/29/2016   Patient is on Vitamin D supplement.   Lab Results  Component Value Date   VD25OH 52 04/29/2016     She is on thyroid medication. Her medication was not changed last visit.   Lab Results  Component Value Date   TSH 1.62 04/29/2016  .   Medication Review: Current Outpatient Prescriptions on File Prior to Visit  Medication Sig Dispense Refill  . albuterol (PROVENTIL HFA;VENTOLIN HFA) 108 (90 Base) MCG/ACT inhaler Inhale 2 puffs into the lungs every 6 (six) hours  as needed for wheezing or shortness of breath (cough). 1 Inhaler 0  . amLODipine (NORVASC) 5 MG tablet Take 1 tablet (5 mg total) by mouth daily. 90 tablet 3  . atorvastatin (LIPITOR) 10 MG tablet TAKE 1 TABLET BY MOUTH  DAILY 90 tablet 0  . azelastine (ASTELIN) 0.1 % nasal spray Place 2 sprays into both nostrils 2 (two) times daily. Use in each nostril as directed 30 mL 2  . azithromycin (ZITHROMAX Z-PAK)  250 MG tablet 2 po day one, then 1 daily x 4 days 6 tablet 0  . Biotin 10 MG TABS Take by mouth daily.    . Calcium Carbonate-Vitamin D (CALCIUM-VITAMIN D) 500-200 MG-UNIT per tablet Take 1 tablet by mouth 2 (two) times daily with a meal.    . CINNAMON PO Take 1,000 Units by mouth daily. 2 capsules daily    . fluticasone (FLONASE) 50 MCG/ACT nasal spray Place 2 sprays into both nostrils daily. 16 g 0  . glipiZIDE (GLUCOTROL) 5 MG tablet TAKE 1 TABLET BY MOUTH  TWICE A DAY BEFORE MEALS 180 tablet 1  . levothyroxine (SYNTHROID, LEVOTHROID) 50 MCG tablet TAKE 1 TABLET EVERY MORNING ON AN EMPTY STOMACH WITH  ONLY WATER FOR 30 MINUTES - NEED OFFICE VISIT BEFORE  REFILL 90 tablet 0  . lisinopril-hydrochlorothiazide (PRINZIDE,ZESTORETIC) 20-25 MG tablet TAKE 1 TABLET DAILY FOR  BLOOD PRESSURE 90 tablet 0  . Magnesium 250 MG TABS Take 250 mg by mouth daily.    . metFORMIN (GLUCOPHAGE-XR) 500 MG 24 hr tablet TAKE 2 TABLETS BY MOUTH  TWICE A DAY WITH MEALS FOR  DIABETES 360 tablet 1  . Omega-3 Fatty Acids (FISH OIL) 1000 MG CAPS Take by mouth daily.    . ranitidine (ZANTAC) 150 MG tablet Take 1 tablet (150 mg total) by mouth 2 (two) times daily. 60 tablet 1  . traMADol (ULTRAM) 50 MG tablet Take 1 tablet 4 x day if needed for severe pain 50 tablet 1  . traZODone (DESYREL) 150 MG tablet Take 1 tablet (150 mg total) by mouth at bedtime. Take 1/2 to 1 tablet at bedtime as needed for sleep 90 tablet 2   No current facility-administered medications on file prior to visit.     Current Problems (verified) Patient Active Problem List   Diagnosis Date Noted  . Medication management 03/08/2014  . Mixed hyperlipidemia 03/21/2013  . Vitamin D deficiency 03/21/2013  . T2_NIDDM w/Stage 2 CKD (GFR 51 ml/min) 03/17/2013  . Hypothyroidism   . Cervical radiculopathy at C6 10/22/2011  . Essential hypertension 07/01/2009    Screening Tests Immunization History  Administered Date(s) Administered  . Influenza Split  02/13/2013  . Influenza, High Dose Seasonal PF 12/07/2014, 12/09/2015  . Influenza-Unspecified 03/26/2014  . Pneumococcal Conjugate-13 12/09/2015  . Pneumococcal-Unspecified 03/23/2008  . Tdap 07/17/2010  . Zoster 01/14/2012    Preventative care: Last colonoscopy: 2006, due for another cologuard did per patient Last mammogram: Jan 2017 q 2 years DEXA:2013  Echo 2011 CXR 2013 Cervival neck 2013  Names of Other Physician/Practitioners you currently use: 1. [facility] here for primary care 2. Dr. Caryn Section at [facility], eye doctor, last visit dec 2017 3. Dr. Dulce Sellar, dentist, last visit 2014 Patient Care Team: [name], MD as PCP - General (Internal Medicine) Kathleene Hazel, MD as Consulting Physician (Cardiology) Charna Elizabeth, MD as Consulting Physician (Gastroenterology) Estill Bamberg, MD as Consulting Physician (Orthopedic Surgery) Freddy Finner, MD as Consulting Physician (Obstetrics and  Gynecology)  Allergies No Known Allergies  SURGICAL HISTORY She  has a past surgical history that includes Breast surgery (80's) and Anterior cervical decomp/discectomy fusion (10/22/2011). FAMILY HISTORY Her family history includes Drug abuse in her maternal grandmother; Heart disease in her mother; Hyperlipidemia in her mother; Hypertension in her mother. SOCIAL HISTORY She  reports that she has never smoked. She has never used smokeless tobacco. She reports that she drinks about 3.5 oz of alcohol per week . She reports that she does not use drugs.   MEDICARE WELLNESS OBJECTIVES: Physical activity: Current Exercise Habits: Structured exercise class, Type of exercise: strength training/weights, Time (Minutes): 30, Frequency (Times/Week): 5, Weekly Exercise (Minutes/Week): 150, Intensity: Mild Cardiac risk factors: Cardiac Risk Factors include: advanced age (>2men, >46 women);diabetes mellitus;dyslipidemia;hypertension;family  history of premature cardiovascular disease Depression/mood screen:   Depression screen [facility] 2/9 07/30/2016  Decreased Interest 0  Down, Depressed, Hopeless 0  PHQ - 2 Score 0    ADLs:  In your present state of health, do you have any difficulty performing the following activities: 07/30/2016 04/29/2016  Hearing? N N  Vision? N N  Difficulty concentrating or making decisions? N N  Walking or climbing stairs? N N  Dressing or bathing? N N  Doing errands, shopping? N N  Some recent data might be hidden     Cognitive Testing  Alert? Yes  Normal Appearance?Yes  Oriented to person? Yes  Place? Yes   Time? Yes  Recall of three objects?  Yes  Can perform simple calculations? Yes  Displays appropriate judgment?Yes  Can read the correct time from a watch face?Yes  EOL planning: Does Patient Have a Medical Advance Directive?: No Would patient like information on creating a medical advance directive?: Yes (MAU/Ambulatory/Procedural Areas - Information given)  Review of Systems  Constitutional: Negative for chills, fever and malaise/fatigue.  HENT: Negative for congestion, ear pain, hearing loss, nosebleeds, sore throat and tinnitus.   Eyes: Negative for blurred vision and double vision.  Respiratory: Negative for cough, shortness of breath and wheezing.   Cardiovascular: Negative for chest pain, palpitations and leg swelling.  Gastrointestinal: Negative for blood in stool, constipation, diarrhea, heartburn and melena.  Genitourinary: Negative.   Neurological: Negative for dizziness, sensory change, loss of consciousness and headaches.  Psychiatric/Behavioral: Negative for depression. The patient has insomnia. The patient is not nervous/anxious.      Objective:     Today's Vitals   07/30/16 1528  BP: 124/68  Pulse: 66  Resp: 16  Temp: 97.9 F (36.6 C)  SpO2: 98%  Weight: 152 lb 9.6 oz (69.2 kg)  Height: 5' 3.75" (1.619 m)  PainSc: 2   PainLoc: Back   Body mass index is 26.4  kg/m.  General appearance: alert, no distress, WD/WN, female HEENT: normocephalic, sclerae anicteric, TMs pearly, nares patent, no discharge or erythema, pharynx normal Oral cavity: MMM, no lesions Neck: supple, no lymphadenopathy, no thyromegaly, no masses Heart: RRR, normal S1, S2, no murmurs Lungs: CTA bilaterally, no wheezes, rhonchi, or rales Abdomen: +bs, soft, non tender, non distended, no masses, no hepatomegaly, no splenomegaly Musculoskeletal: nontender, no swelling, no obvious deformity Extremities: no edema, no cyanosis, no clubbing Pulses: 2+ symmetric, upper and lower extremities, normal cap refill Neurological: alert, oriented x 3, CN2-12 intact, strength normal upper extremities and lower extremities, sensation normal throughout, DTRs 2+ throughout, no cerebellar signs, gait normal Psychiatric: normal affect, behavior normal, pleasant   Medicare Attestation I have personally reviewed: The patient's medical and social history Their  use of alcohol, tobacco or illicit drugs Their current medications and supplements The patient's functional ability including ADLs,fall risks, home safety risks, cognitive, and hearing and visual impairment Diet and physical activities Evidence for depression or mood disorders  The patient's weight, height, BMI, and visual acuity have been recorded in the chart.  I have made referrals, counseling, and provided education to the patient based on review of the above and I have provided the patient with a written personalized care plan for preventive services.     Elizabeth MullingAmanda Tramayne Sebesta, PA-C   07/30/2016

## 2016-07-31 LAB — HEPATIC FUNCTION PANEL
ALBUMIN: 4 g/dL (ref 3.6–5.1)
ALT: 12 U/L (ref 6–29)
AST: 15 U/L (ref 10–35)
Alkaline Phosphatase: 65 U/L (ref 33–130)
BILIRUBIN DIRECT: 0.1 mg/dL (ref <=0.2)
BILIRUBIN TOTAL: 0.4 mg/dL (ref 0.2–1.2)
Indirect Bilirubin: 0.3 mg/dL (ref 0.2–1.2)
Total Protein: 6.4 g/dL (ref 6.1–8.1)

## 2016-07-31 LAB — LIPID PANEL
CHOL/HDL RATIO: 3.1 ratio (ref <5.0)
CHOLESTEROL: 152 mg/dL (ref <200)
HDL: 49 mg/dL — AB (ref >50)
LDL Cholesterol: 75 mg/dL (ref <100)
Triglycerides: 141 mg/dL (ref <150)
VLDL: 28 mg/dL (ref <30)

## 2016-07-31 LAB — HEMOGLOBIN A1C
HEMOGLOBIN A1C: 7.1 % — AB (ref <5.7)
Mean Plasma Glucose: 157 mg/dL

## 2016-07-31 LAB — BASIC METABOLIC PANEL WITH GFR
BUN: 23 mg/dL (ref 7–25)
CALCIUM: 9.5 mg/dL (ref 8.6–10.4)
CO2: 23 mmol/L (ref 20–31)
CREATININE: 1.15 mg/dL — AB (ref 0.50–0.99)
Chloride: 102 mmol/L (ref 98–110)
GFR, EST AFRICAN AMERICAN: 56 mL/min — AB (ref >=60)
GFR, Est Non African American: 49 mL/min — ABNORMAL LOW (ref >=60)
Glucose, Bld: 169 mg/dL — ABNORMAL HIGH (ref 65–99)
Potassium: 4.3 mmol/L (ref 3.5–5.3)
SODIUM: 137 mmol/L (ref 135–146)

## 2016-07-31 LAB — MAGNESIUM: Magnesium: 2 mg/dL (ref 1.5–2.5)

## 2016-07-31 NOTE — Progress Notes (Signed)
Pt aware of lab results & voiced understanding of those results.

## 2016-09-08 ENCOUNTER — Other Ambulatory Visit: Payer: Self-pay | Admitting: Internal Medicine

## 2016-09-18 ENCOUNTER — Other Ambulatory Visit: Payer: Self-pay | Admitting: Internal Medicine

## 2016-09-18 DIAGNOSIS — E782 Mixed hyperlipidemia: Secondary | ICD-10-CM

## 2016-09-22 ENCOUNTER — Other Ambulatory Visit: Payer: Self-pay | Admitting: Internal Medicine

## 2016-11-04 ENCOUNTER — Other Ambulatory Visit: Payer: Self-pay | Admitting: Internal Medicine

## 2016-11-05 ENCOUNTER — Other Ambulatory Visit: Payer: Self-pay

## 2016-11-05 DIAGNOSIS — I10 Essential (primary) hypertension: Secondary | ICD-10-CM

## 2016-11-05 MED ORDER — AMLODIPINE BESYLATE 5 MG PO TABS: 5.0000 mg | ORAL_TABLET | Freq: Every day | ORAL | 0 refills | Status: DC

## 2016-12-09 ENCOUNTER — Encounter: Payer: Self-pay | Admitting: Internal Medicine

## 2016-12-14 NOTE — Progress Notes (Signed)
Patient ID: Elizabeth May, female   DOB: May 04, 1945, 71 y.o.   MRN: [id number]   CPE AND 3 MONTH FOLLOW UP  Assessment and Plan    Essential hypertension - continue medications, DASH diet, exercise and monitor at home. Call if greater than 130/80.  -     CBC with Differential/Platelet -     BASIC METABOLIC PANEL WITH GFR -     Hepatic function panel -     TSH  Type 2 diabetes mellitus with stage 2 chronic kidney disease, without long-term current use of insulin (HCC) Discussed general issues about diabetes pathophysiology and management., Educational material distributed., Suggested low cholesterol diet., Encouraged aerobic exercise., Discussed foot care., Reminded to get yearly retinal exam. -     Hemoglobin A1c  Hypothyroidism, unspecified type Hypothyroidism-check TSH level, continue medications the same, reminded to take on an empty stomach 30-38mins before food.  -     TSH  Cervical radiculopathy at C6 s/p fusion, takes tramadol PRN, mainly for travel  Mixed hyperlipidemia -continue medications, check lipids, decrease fatty foods, increase activity.  -     Lipid panel  Vitamin D deficiency  Medication management -     Magnesium  Needs flu shot -     Flu vaccine HIGH DOSE PF  Rash and nonspecific skin eruption -     doxycycline (VIBRAMYCIN) 100 MG capsule; Take 1 capsule twice daily with food - if not better may need to see Derm versus BX   Over 40 minutes of exam, counseling, chart review and critical decision making was performed Future Appointments Date Time Provider Department Center  12/21/2017 2:00 PM Quentin Mulling, PA-C [id number] None    Subjective:  Elizabeth May is a 71 y.o. female who presents for CPE and 3 month follow up for HTN, DM, chol  She has had elevated blood pressure for years. Her blood pressure has been controlled at home, today their BP is BP: 130/78 She does workout. She denies chest pain, shortness of breath, dizziness.   She  has rash bilateral arms, legs, "sores", has had previous and treated with doxy with help.  She is on cholesterol medication and denies myalgias. Her cholesterol is at goal. The cholesterol last visit was:   Lab Results  Component Value Date   CHOL 152 07/30/2016   HDL 49 (L) 07/30/2016   LDLCALC 75 07/30/2016   TRIG 141 07/30/2016   CHOLHDL 3.1 07/30/2016   She has had diabetes controlled with metformin and glipizide BID, no low blood sugars. She has been working on diet and exercise for diabetes with CKD, and denies foot ulcerations, hyperglycemia, hypoglycemia , increased appetite, nausea, paresthesia of the feet, polydipsia, polyuria, visual disturbances, vomiting and weight loss. Last A1C in the office was:  Lab Results  Component Value Date   HGBA1C 7.1 (H) 07/30/2016   Lab Results  Component Value Date   GFRNONAA 49 (L) 07/30/2016   Patient is on Vitamin D supplement.   Lab Results  Component Value Date   VD25OH 52 04/29/2016     She is on thyroid medication. Her medication was not changed last visit.   Lab Results  Component Value Date   TSH 2.20 07/30/2016   BMI is Body mass index is 25.35 kg/m., she is working on diet and exercise. Wt Readings from Last 3 Encounters:  12/15/16 145 lb 6.4 oz (66 kg)  07/30/16 152 lb 9.6 oz (69.2 kg)  06/24/16 152 lb (68.9 kg)  Medication Review: Current Outpatient Prescriptions on File Prior to Visit  Medication Sig Dispense Refill  . albuterol (PROVENTIL HFA;VENTOLIN HFA) 108 (90 Base) MCG/ACT inhaler Inhale 2 puffs into the lungs every 6 (six) hours as needed for wheezing or shortness of breath (cough). 1 Inhaler 0  . amLODipine (NORVASC) 5 MG tablet Take 1 tablet (5 mg total) by mouth daily. 90 tablet 0  . atorvastatin (LIPITOR) 10 MG tablet TAKE 1 TABLET BY MOUTH  DAILY 90 tablet 0  . azelastine (ASTELIN) 0.1 % nasal spray Place 2 sprays into both nostrils 2 (two) times daily. Use in each nostril as directed 30 mL 2  .  Biotin 10 MG TABS Take by mouth daily.    . Calcium Carbonate-Vitamin D (CALCIUM-VITAMIN D) 500-200 MG-UNIT per tablet Take 1 tablet by mouth 2 (two) times daily with a meal.    . CINNAMON PO Take 1,000 Units by mouth daily. 2 capsules daily    . fluticasone (FLONASE) 50 MCG/ACT nasal spray Place 2 sprays into both nostrils daily. 16 g 0  . glipiZIDE (GLUCOTROL) 5 MG tablet TAKE 1 TABLET BY MOUTH  TWICE A DAY BEFORE MEALS 180 tablet 1  . levothyroxine (SYNTHROID, LEVOTHROID) 50 MCG tablet TAKE 1 TABLET EVERY MORNING ON AN EMPTY STOMACH WITH  ONLY WATER FOR 30 MINUTES - NEED OFFICE VISIT BEFORE  REFILL 90 tablet 1  . lisinopril-hydrochlorothiazide (PRINZIDE,ZESTORETIC) 20-25 MG tablet TAKE 1 TABLET DAILY FOR  BLOOD PRESSURE 90 tablet 1  . Magnesium 250 MG TABS Take 250 mg by mouth daily.    . metFORMIN (GLUCOPHAGE-XR) 500 MG 24 hr tablet TAKE 2 TABLETS BY MOUTH  TWICE A DAY WITH MEALS FOR  DIABETES 360 tablet 0  . Omega-3 Fatty Acids (FISH OIL) 1000 MG CAPS Take by mouth daily.    . ranitidine (ZANTAC) 150 MG tablet Take 1 tablet (150 mg total) by mouth 2 (two) times daily. 60 tablet 1  . traMADol (ULTRAM) 50 MG tablet Take 1 tablet 4 x day if needed for severe pain 50 tablet 1  . traZODone (DESYREL) 150 MG tablet Take 1 tablet (150 mg total) by mouth at bedtime. Take 1/2 to 1 tablet at bedtime as needed for sleep 90 tablet 2   No current facility-administered medications on file prior to visit.     Current Problems (verified) Patient Active Problem List   Diagnosis Date Noted  . Medication management 03/08/2014  . Mixed hyperlipidemia 03/21/2013  . Vitamin D deficiency 03/21/2013  . T2_NIDDM w/Stage 2 CKD (GFR 51 ml/min) 03/17/2013  . Hypothyroidism   . Cervical radiculopathy at C6 10/22/2011  . Essential hypertension 07/01/2009    Screening Tests Immunization History  Administered Date(s) Administered  . Influenza Split 02/13/2013  . Influenza, High Dose Seasonal PF 12/07/2014,  12/09/2015, 12/15/2016  . Influenza-Unspecified 03/26/2014  . Pneumococcal Conjugate-13 12/09/2015  . Pneumococcal-Unspecified 03/23/2008  . Tdap 07/17/2010  . Zoster 01/14/2012   Get pneumonia and flu today  Preventative care: Last colonoscopy: 2006 cologuard did per patient Last mammogram: Jan 2017 q 2 years DEXA:2013 Echo 2011 CXR 2013 Cervival neck 2013  Names of Other Physician/Practitioners you currently use: 1. [facility] here for primary care 2. [facility] Caryn [facility] at [facility], eye doctor, last visit dec 2017 3. Dr. Dulce Sellar, dentist, last visit July 2018 Patient Care Team: [name], MD as PCP - General (Internal Medicine) Kathleene Hazel, MD as Consulting Physician (Cardiology) Charna Elizabeth, MD as Consulting Physician (  Gastroenterology) Estill Bamberg, MD as Consulting Physician (Orthopedic Surgery) Freddy Finner, MD as Consulting Physician (Obstetrics and Gynecology)  Allergies No Known Allergies  SURGICAL HISTORY She  has a past surgical history that includes Breast surgery (80's) and Anterior cervical decomp/discectomy fusion (10/22/2011). FAMILY HISTORY Her family history includes Drug abuse in her maternal grandmother; Heart disease in her mother; Hyperlipidemia in her mother; Hypertension in her mother. SOCIAL HISTORY She  reports that she has never smoked. She has never used smokeless tobacco. She reports that she drinks about 3.5 oz of alcohol per week . She reports that she does not use drugs.   Review of Systems  Constitutional: Negative for chills, fever and malaise/fatigue.  HENT: Negative for congestion, ear pain, hearing loss, nosebleeds, sore throat and tinnitus.   Eyes: Negative for blurred vision and double vision.  Respiratory: Negative for cough, shortness of breath and wheezing.   Cardiovascular: Negative for chest pain, palpitations and leg swelling.  Gastrointestinal: Negative for blood in  stool, constipation, diarrhea, heartburn and melena.  Genitourinary: Negative.   Neurological: Negative for dizziness, sensory change, loss of consciousness and headaches.  Psychiatric/Behavioral: Negative for depression. The patient has insomnia. The patient is not nervous/anxious.      Objective:     Today's Vitals   12/15/16 1406  BP: 130/78  Pulse: 71  Resp: 14  Temp: (!) 97.5 F (36.4 C)  SpO2: 98%  Weight: 145 lb 6.4 oz (66 kg)  Height: 5' 3.5" (1.613 m)   Body mass index is 25.35 kg/m.  General appearance: alert, no distress, WD/WN, female HEENT: normocephalic, sclerae anicteric, TMs pearly, nares patent, no discharge or erythema, pharynx normal Oral cavity: MMM, no lesions Neck: supple, no lymphadenopathy, no thyromegaly, no masses Heart: RRR, normal S1, S2, no murmurs Lungs: CTA bilaterally, no wheezes, rhonchi, or rales Abdomen: +bs, soft, non tender, non distended, no masses, no hepatomegaly, no splenomegaly Musculoskeletal: nontender, no swelling, no obvious deformity Extremities: no edema, no cyanosis, no clubbing Pulses: 2+ symmetric, upper and lower extremities, normal cap refill Neurological: alert, oriented x 3, CN2-12 intact, strength normal upper extremities and lower extremities, sensation normal throughout, DTRs 2+ throughout, no cerebellar signs, gait normal Psychiatric: normal affect, behavior normal, pleasant  Skin: ulcerative papules on bilateral arms, legs, with some erythema, but no warmth or discharge. Left lower leg with scaly nodule.   EKG: WNL, RBBB, no ST changes   Quentin Mulling, PA-C   12/15/2016

## 2016-12-15 ENCOUNTER — Ambulatory Visit (INDEPENDENT_AMBULATORY_CARE_PROVIDER_SITE_OTHER): Payer: Medicare Other | Admitting: Physician Assistant

## 2016-12-15 ENCOUNTER — Encounter: Payer: Self-pay | Admitting: Physician Assistant

## 2016-12-15 VITALS — BP 130/78 | HR 71 | Temp 97.5°F | Resp 14 | Ht 63.5 in | Wt 145.4 lb

## 2016-12-15 DIAGNOSIS — R21 Rash and other nonspecific skin eruption: Secondary | ICD-10-CM

## 2016-12-15 DIAGNOSIS — M5412 Radiculopathy, cervical region: Secondary | ICD-10-CM

## 2016-12-15 DIAGNOSIS — E782 Mixed hyperlipidemia: Secondary | ICD-10-CM

## 2016-12-15 DIAGNOSIS — E1122 Type 2 diabetes mellitus with diabetic chronic kidney disease: Secondary | ICD-10-CM

## 2016-12-15 DIAGNOSIS — E039 Hypothyroidism, unspecified: Secondary | ICD-10-CM

## 2016-12-15 DIAGNOSIS — E559 Vitamin D deficiency, unspecified: Secondary | ICD-10-CM

## 2016-12-15 DIAGNOSIS — I10 Essential (primary) hypertension: Secondary | ICD-10-CM | POA: Diagnosis not present

## 2016-12-15 DIAGNOSIS — Z0001 Encounter for general adult medical examination with abnormal findings: Secondary | ICD-10-CM

## 2016-12-15 DIAGNOSIS — N182 Chronic kidney disease, stage 2 (mild): Secondary | ICD-10-CM

## 2016-12-15 DIAGNOSIS — Z23 Encounter for immunization: Secondary | ICD-10-CM

## 2016-12-15 DIAGNOSIS — Z79899 Other long term (current) drug therapy: Secondary | ICD-10-CM

## 2016-12-15 DIAGNOSIS — Z0000 Encounter for general adult medical examination without abnormal findings: Secondary | ICD-10-CM

## 2016-12-15 DIAGNOSIS — Z136 Encounter for screening for cardiovascular disorders: Secondary | ICD-10-CM | POA: Diagnosis not present

## 2016-12-15 MED ORDER — DOXYCYCLINE HYCLATE 100 MG PO CAPS: ORAL_CAPSULE | ORAL | 0 refills | Status: DC

## 2016-12-15 NOTE — Patient Instructions (Addendum)
Diabetes is a very complicated disease...lets simplify it.  An easy way to look at it to understand the complications is if you think of the extra sugar floating in your blood stream as glass shards floating through your blood stream.    Diabetes affects your small vessels first: 1) The glass shards (sugar) scraps down the tiny blood vessels in your eyes and lead to diabetic retinopathy, the leading cause of blindness in the [place]. Diabetes is the leading cause of newly diagnosed adult (20 to 69 years of age) blindness in the [place].  2) The glass shards scratches down the tiny vessels of your legs leading to nerve damage called neuropathy and can lead to amputations of your feet. More than 60% of all non-traumatic amputations of lower limbs occur in people with diabetes.  3) Over time the small vessels in your brain are shredded and closed off, individually this does not cause any problems but over a long period of time many of the small vessels being blocked can lead to Vascular Dementia.   4) Your kidney's are a filter system and have a "net" that keeps certain things in the body and lets bad things out. Sugar shreds this net and leads to kidney damage and eventually failure. Decreasing the sugar that is destroying the net and certain blood pressure medications can help stop or decrease progression of kidney disease. Diabetes was the primary cause of kidney failure in 44 percent of all new cases in 2011.  5) Diabetes also destroys the small vessels in your penis that lead to erectile dysfunction. Eventually the vessels are so damaged that you may not be responsive to cialis or viagra.   Diabetes and your large vessels: Your larger vessels consist of your coronary arteries in your heart and the carotid vessels to your brain. Diabetes or even increased sugars put you at 300% increased risk of heart attack and stroke and this is why.. The sugar scrapes down your large blood vessels and your body  sees this as an internal injury and tries to repair itself. Just like you get a scab on your skin, your platelets will stick to the blood vessel wall trying to heal it. This is why we have diabetics on low dose aspirin daily, this prevents the platelets from sticking and can prevent plaque formation. In addition, your body takes cholesterol and tries to shove it into the open wound. This is why we want your LDL, or bad cholesterol, below 70.   The combination of platelets and cholesterol over 5-10 years forms plaque that can break off and cause a heart attack or stroke.   PLEASE REMEMBER:  Diabetes is preventable! Up to 85 percent of complications and morbidities among individuals with type 2 diabetes can be prevented, delayed, or effectively treated and minimized with regular visits to a health professional, appropriate monitoring and medication, and a healthy diet and lifestyle.     Bad carbs also include fruit juice, alcohol, and sweet tea. These are empty calories that do not signal to your brain that you are full.   Please remember the good carbs are still carbs which convert into sugar. So please measure them out no more than 1/2-1 cup of rice, oatmeal, pasta, and beans  Veggies are however free foods! Pile them on.   Not all fruit is created equal. Please see the list below, the fruit at the bottom is higher in sugars than the fruit at the top. Please avoid all dried fruits.       Here is some information to help you keep your heart healthy: Move it! - Aim for 30 mins of activity every day. Take it slowly at first. Talk to us before starting any new exercise program.   Lose it.  -Body Mass Index (BMI) can indicate if you need to lose weight. A healthy range is 18.5-24.9. For a BMI calculator, go to [employer]  Waist Management -Excess abdominal fat is a risk factor for heart disease, diabetes, asthma, stroke and more. Ideal waist circumference is less than 35" for women and less  than 40" for men.   Eat Right -focus on fruits, vegetables, whole grains, and meals you make yourself. Avoid foods with trans fat and high sugar/sodium content.   Snooze or Snore? - Loud snoring can be a sign of sleep apnea, a significant risk factor for high blood pressure, heart attach, stroke, and heart arrhythmias.  Kick the habit -Quit Smoking! Avoid second hand smoke. A single cigarette raises your blood pressure for 20 mins and increases the risk of heart attack and stroke for the next 24 hours.   Are Aspirin and Supplements right for you? -Add ENTERIC COATED low dose 81 mg Aspirin daily OR can do every other day if you have easy bruising to protect your heart and head. As well as to reduce risk of Colon Cancer by 20 %, Skin Cancer by 26 % , Melanoma by 46% and Pancreatic cancer by 60%  Say "No to Stress -There may be little you can do about problems that cause stress. However, techniques such as long walks, meditation, and exercise can help you manage it.   Start Now! - Make changes one at a time and set reasonable goals to increase your likelihood of success.    

## 2016-12-16 LAB — HEMOGLOBIN A1C
EAG (MMOL/L): 8.5 (calc)
HEMOGLOBIN A1C: 7 %{Hb} — AB (ref <5.7)
Mean Plasma Glucose: 154 (calc)

## 2016-12-16 LAB — BASIC METABOLIC PANEL WITH GFR
BUN/Creatinine Ratio: 20 (calc) (ref 6–22)
BUN: 24 mg/dL (ref 7–25)
CO2: 25 mmol/L (ref 20–32)
Calcium: 9.7 mg/dL (ref 8.6–10.4)
Chloride: 98 mmol/L (ref 98–110)
Creat: 1.18 mg/dL — ABNORMAL HIGH (ref 0.50–0.99)
GFR, Est African American: 54 mL/min/{1.73_m2} — ABNORMAL LOW (ref >=60)
GFR, Est Non African American: 47 mL/min/{1.73_m2} — ABNORMAL LOW (ref >=60)
GLUCOSE: 77 mg/dL (ref 65–99)
POTASSIUM: 4.2 mmol/L (ref 3.5–5.3)
SODIUM: 135 mmol/L (ref 135–146)

## 2016-12-16 LAB — URINALYSIS
Bilirubin Urine: NEGATIVE
GLUCOSE, UA: NEGATIVE
HGB URINE DIPSTICK: NEGATIVE
Ketones, ur: NEGATIVE
LEUKOCYTES UA: NEGATIVE
Nitrite: NEGATIVE
Protein, ur: NEGATIVE
Specific Gravity, Urine: 1.017 (ref 1.001–1.03)

## 2016-12-16 LAB — HEPATIC FUNCTION PANEL
AG Ratio: 1.8 (calc) (ref 1.0–2.5)
ALKALINE PHOSPHATASE (APISO): 80 U/L (ref 33–130)
ALT: 11 U/L (ref 6–29)
AST: 15 U/L (ref 10–35)
Albumin: 4.5 g/dL (ref 3.6–5.1)
BILIRUBIN TOTAL: 0.4 mg/dL (ref 0.2–1.2)
Bilirubin, Direct: 0.1 mg/dL (ref 0.0–0.2)
Globulin: 2.5 g/dL (calc) (ref 1.9–3.7)
Indirect Bilirubin: 0.3 mg/dL (calc) (ref 0.2–1.2)
Total Protein: 7 g/dL (ref 6.1–8.1)

## 2016-12-16 LAB — CBC WITH DIFFERENTIAL/PLATELET
Basophils Absolute: 59 cells/uL (ref 0–200)
Basophils Relative: 0.6 %
EOS ABS: 98 {cells}/uL (ref 15–500)
Eosinophils Relative: 1 %
HCT: 43.8 % (ref 35.0–45.0)
Hemoglobin: 14.9 g/dL (ref 11.7–15.5)
Lymphs Abs: 2352 cells/uL (ref 850–3900)
MCH: 28.5 pg (ref 27.0–33.0)
MCHC: 34 g/dL (ref 32.0–36.0)
MCV: 83.9 fL (ref 80.0–100.0)
MONOS PCT: 4.7 %
MPV: 10.3 fL (ref 7.5–12.5)
NEUTROS PCT: 69.7 %
Neutro Abs: 6831 cells/uL (ref 1500–7800)
PLATELETS: 432 10*3/uL — AB (ref 140–400)
RBC: 5.22 10*6/uL — ABNORMAL HIGH (ref 3.80–5.10)
RDW: 12.4 % (ref 11.0–15.0)
Total Lymphocyte: 24 %
WBC: 9.8 10*3/uL (ref 3.8–10.8)
WBCMIX: 461 {cells}/uL (ref 200–950)

## 2016-12-16 LAB — TSH: TSH: 1.78 m[IU]/L (ref 0.40–4.50)

## 2016-12-16 LAB — LIPID PANEL
CHOL/HDL RATIO: 2.8 (calc) (ref <5.0)
Cholesterol: 151 mg/dL (ref <200)
HDL: 53 mg/dL (ref >50)
LDL Cholesterol (Calc): 75 mg/dL (calc)
NON-HDL CHOLESTEROL (CALC): 98 mg/dL (ref <130)
TRIGLYCERIDES: 146 mg/dL (ref <150)

## 2016-12-16 LAB — MAGNESIUM: Magnesium: 2.2 mg/dL (ref 1.5–2.5)

## 2016-12-17 LAB — URINE CULTURE
MICRO NUMBER:: 81061895
SPECIMEN QUALITY:: ADEQUATE

## 2016-12-18 NOTE — Progress Notes (Signed)
LVM for pt to return office call for LAB results.

## 2016-12-21 LAB — HM DIABETES EYE EXAM

## 2016-12-23 NOTE — Progress Notes (Signed)
LVM for pt to return office call for LAB results.

## 2017-01-12 ENCOUNTER — Other Ambulatory Visit: Payer: Self-pay | Admitting: Physician Assistant

## 2017-01-12 DIAGNOSIS — R21 Rash and other nonspecific skin eruption: Secondary | ICD-10-CM

## 2017-01-18 ENCOUNTER — Encounter: Payer: Self-pay | Admitting: Physician Assistant

## 2017-01-22 ENCOUNTER — Other Ambulatory Visit: Payer: Self-pay | Admitting: Physician Assistant

## 2017-01-22 DIAGNOSIS — R21 Rash and other nonspecific skin eruption: Secondary | ICD-10-CM

## 2017-01-23 ENCOUNTER — Other Ambulatory Visit: Payer: Self-pay | Admitting: Internal Medicine

## 2017-01-24 ENCOUNTER — Other Ambulatory Visit: Payer: Self-pay | Admitting: Physician Assistant

## 2017-01-24 DIAGNOSIS — I10 Essential (primary) hypertension: Secondary | ICD-10-CM

## 2017-02-03 ENCOUNTER — Other Ambulatory Visit: Payer: Self-pay

## 2017-02-03 MED ORDER — GLIPIZIDE 5 MG PO TABS: ORAL_TABLET | ORAL | 1 refills | Status: DC

## 2017-02-19 ENCOUNTER — Other Ambulatory Visit: Payer: Self-pay | Admitting: Physician Assistant

## 2017-02-19 DIAGNOSIS — R21 Rash and other nonspecific skin eruption: Secondary | ICD-10-CM

## 2017-02-28 ENCOUNTER — Other Ambulatory Visit: Payer: Self-pay | Admitting: Internal Medicine

## 2017-03-28 NOTE — Patient Instructions (Signed)

## 2017-03-28 NOTE — Progress Notes (Signed)
This very nice 72 y.o.  MWF presents for 3 month follow up with HTN, HLD, T2_DM and Vitamin D Deficiency.      Patient is treated for HTN circa 2007  & BP has been controlled at home. Today's BP is at goal -  138/84. Patient has had no complaints of any cardiac type chest pain, palpitations, dyspnea / orthopnea / PND, dizziness, claudication, or dependent edema.     Hyperlipidemia is controlled with diet & meds. Patient denies myalgias or other med SE's. Last Lipids were at goal:  Lab Results  Component Value Date   CHOL 151 12/15/2016   HDL 53 12/15/2016   LDLCALC 75 07/30/2016   TRIG 146 12/15/2016   CHOLHDL 2.8 12/15/2016      Also, the patient has history of T2_NIDDM circa 2006 w/CKD2 and has had no symptoms of reactive hypoglycemia, diabetic polys, paresthesias or visual blurring.  Last A1c was not at goal:  Lab Results  Component Value Date   HGBA1C 7.0 (H) 12/15/2016      Further, the patient also has history of Vitamin D Deficiency and supplements vitamin D without any suspected side-effects. Last vitamin D was sl low (goal 70-100):  Lab Results  Component Value Date   VD25OH 52 04/29/2016   Current Outpatient Medications on File Prior to Visit  Medication Sig  . albuterol (PROVENTIL HFA;VENTOLIN HFA) 108 (90 Base) MCG/ACT inhaler Inhale 2 puffs into the lungs every 6 (six) hours as needed for wheezing or shortness of breath (cough).  Marland Kitchen. amLODipine (NORVASC) 5 MG tablet TAKE 1 TABLET BY MOUTH  DAILY  . atorvastatin (LIPITOR) 10 MG tablet TAKE 1 TABLET BY MOUTH  DAILY  . azelastine (ASTELIN) 0.1 % nasal spray Place 2 sprays into both nostrils 2 (two) times daily. Use in each nostril as directed  . Biotin 10 MG TABS Take by mouth daily.  . Calcium Carbonate-Vitamin D (CALCIUM-VITAMIN D) 500-200 MG-UNIT per tablet Take 1 tablet by mouth 2 (two) times daily with a meal.  . CINNAMON PO Take 1,000 Units by mouth daily. 2 capsules daily  . fluticasone (FLONASE) 50 MCG/ACT nasal  spray Place 2 sprays into both nostrils daily.  Marland Kitchen. glipiZIDE (GLUCOTROL) 5 MG tablet TAKE 1 TABLET BY MOUTH  TWICE A DAY BEFORE MEALS  . levothyroxine (SYNTHROID, LEVOTHROID) 50 MCG tablet TAKE 1 TABLET EVERY MORNING ON AN EMPTY STOMACH WITH  ONLY WATER FOR 30 MINUTES  . lisinopril-hydrochlorothiazide (PRINZIDE,ZESTORETIC) 20-25 MG tablet TAKE 1 TABLET DAILY FOR  BLOOD PRESSURE  . Magnesium 250 MG TABS Take 250 mg by mouth daily.  . metFORMIN (GLUCOPHAGE-XR) 500 MG 24 hr tablet TAKE 2 TABLETS BY MOUTH  TWICE A DAY WITH MEALS FOR  DIABETES  . Omega-3 Fatty Acids (FISH OIL) 1000 MG CAPS Take by mouth daily.  . ranitidine (ZANTAC) 150 MG tablet Take 1 tablet (150 mg total) by mouth 2 (two) times daily.  . traZODone (DESYREL) 150 MG tablet Take 1 tablet (150 mg total) by mouth at bedtime. Take 1/2 to 1 tablet at bedtime as needed for sleep   No current facility-administered medications on file prior to visit.    No Known Allergies PMHx:   Past Medical History:  Diagnosis Date  . Diabetes mellitus   . Headache(784.0)    hx migraines  . Hyperlipidemia   . Hypertension   . Hypothyroidism    Immunization History  Administered Date(s) Administered  . Influenza Split 02/13/2013  . Influenza, High  Dose Seasonal PF 12/07/2014, 12/09/2015, 12/15/2016  . Influenza-Unspecified 03/26/2014  . Pneumococcal Conjugate-13 12/09/2015  . Pneumococcal-Unspecified 03/23/2008  . Tdap 07/17/2010  . Zoster 01/14/2012   Past Surgical History:  Procedure Laterality Date  . ANTERIOR CERVICAL DECOMP/DISCECTOMY FUSION  10/22/2011   Procedure: ANTERIOR CERVICAL DECOMPRESSION/DISCECTOMY FUSION 1 LEVEL;  Surgeon: Emilee Hero, MD;  Location: [facility] OR;  Service: Orthopedics;  Laterality: Right;  Anterior cervical decompression fusion, cervical 5-6 with instrumentation and allograft.  Marland Kitchen BREAST SURGERY  80's   lft cyst   FHx:    Reviewed / unchanged  SHx:    Reviewed / unchanged  Systems  Review:  Constitutional: Denies fever, chills, wt changes, headaches, insomnia, fatigue, night sweats, change in appetite. Eyes: Denies redness, blurred vision, diplopia, discharge, itchy, watery eyes.  ENT: Denies discharge, congestion, post nasal drip, epistaxis, sore throat, earache, hearing loss, dental pain, tinnitus, vertigo, sinus pain, snoring.  CV: Denies chest pain, palpitations, irregular heartbeat, syncope, dyspnea, diaphoresis, orthopnea, PND, claudication or edema. Respiratory: denies cough, dyspnea, DOE, pleurisy, hoarseness, laryngitis, wheezing.  Gastrointestinal: Denies dysphagia, odynophagia, heartburn, reflux, water brash, abdominal pain or cramps, nausea, vomiting, bloating, diarrhea, constipation, hematemesis, melena, hematochezia  or hemorrhoids. Genitourinary: Denies dysuria, frequency, urgency, nocturia, hesitancy, discharge, hematuria or flank pain. Musculoskeletal: Denies arthralgias, myalgias, stiffness, jt. swelling, pain, limping or strain/sprain.  Skin: Denies pruritus, rash, hives, warts, acne, eczema or change in skin lesion(s). Neuro: No weakness, tremor, incoordination, spasms, paresthesia or pain. Psychiatric: Denies confusion, memory loss or sensory loss. Endo: Denies change in weight, skin or hair change.  Heme/Lymph: No excessive bleeding, bruising or enlarged lymph nodes.  Physical Exam  BP 138/84   Pulse 72   Temp (!) 97.5 F (36.4 C)   Resp 16   Ht 5' 3.5" (1.613 m)   Wt 147 lb 9.6 oz (67 kg)   BMI 25.74 kg/m   Appears well nourished, well groomed  and in no distress.  Eyes: PERRLA, EOMs, conjunctiva no swelling or erythema. Sinuses: No frontal/maxillary tenderness ENT/Mouth: EAC's clear, TM's nl w/o erythema, bulging. Nares clear w/o erythema, swelling, exudates. Oropharynx clear without erythema or exudates. Oral hygiene is good. Tongue normal, non obstructing. Hearing intact.  Neck: Supple. Thyroid nl. Car 2+/2+ without bruits, nodes or  JVD. Chest: Respirations nl with BS clear & equal w/o rales, rhonchi, wheezing or stridor.  Cor: Heart sounds normal w/ regular rate and rhythm without sig. murmurs, gallops, clicks or rubs. Peripheral pulses normal and equal  without edema.  Abdomen: Soft & bowel sounds normal. Non-tender w/o guarding, rebound, hernias, masses or organomegaly.  Lymphatics: Unremarkable.  Musculoskeletal: Full ROM all peripheral extremities, joint stability, 5/5 strength and normal gait.  Skin: Warm, dry without exposed rashes, lesions or ecchymosis apparent.  Neuro: Cranial nerves intact, reflexes equal bilaterally. Sensory-motor testing grossly intact. Tendon reflexes grossly intact.  Pysch: Alert & oriented x 3.  Insight and judgement nl & appropriate. No ideations.  Assessment and Plan:  1. Essential hypertension  - Continue medication, monitor blood pressure at home.  - Continue DASH diet. Reminder to go to the ER if any CP,  SOB, nausea, dizziness, severe HA, changes vision/speech.  - CBC with Differential/Platelet - BASIC METABOLIC PANEL WITH GFR - Magnesium - TSH  2. Hyperlipidemia, mixed  - Continue diet/meds, exercise,& lifestyle modifications.   - Hepatic function panel - Lipid panel - TSH  3. Type 2 diabetes mellitus with stage 2 chronic kidney disease, without long-term current use of insulin (HCC)  -  Continue diet, exercise, lifestyle modifications.  - Monitor appropriate labs.  - Hemoglobin A1c - Insulin, random  4. Vitamin D deficiency  - Continue supplementation.  - VITAMIN D 25 Hydroxy  5. Hypothyroidism  - TSH  6. Medication management  - CBC with Differential/Platelet - BASIC METABOLIC PANEL WITH GFR - Hepatic function panel - Magnesium - Lipid panel - TSH - Hemoglobin A1c - Insulin, random - VITAMIN D 25 Hydroxy         Discussed  regular exercise, BP monitoring, weight control to achieve/maintain BMI less than 25 and discussed med and SE's.  Recommended labs to assess and monitor clinical status with further disposition pending results of labs. Over 30 minutes of exam, counseling, chart review was performed.

## 2017-03-29 ENCOUNTER — Encounter: Payer: Self-pay | Admitting: Internal Medicine

## 2017-03-29 ENCOUNTER — Ambulatory Visit: Payer: Medicare Other | Admitting: Internal Medicine

## 2017-03-29 VITALS — BP 138/84 | HR 72 | Temp 97.5°F | Resp 16 | Ht 63.5 in | Wt 147.6 lb

## 2017-03-29 DIAGNOSIS — I10 Essential (primary) hypertension: Secondary | ICD-10-CM | POA: Diagnosis not present

## 2017-03-29 DIAGNOSIS — E559 Vitamin D deficiency, unspecified: Secondary | ICD-10-CM | POA: Diagnosis not present

## 2017-03-29 DIAGNOSIS — E782 Mixed hyperlipidemia: Secondary | ICD-10-CM | POA: Diagnosis not present

## 2017-03-29 DIAGNOSIS — Z79899 Other long term (current) drug therapy: Secondary | ICD-10-CM | POA: Diagnosis not present

## 2017-03-29 DIAGNOSIS — N182 Chronic kidney disease, stage 2 (mild): Secondary | ICD-10-CM | POA: Diagnosis not present

## 2017-03-29 DIAGNOSIS — E1122 Type 2 diabetes mellitus with diabetic chronic kidney disease: Secondary | ICD-10-CM

## 2017-03-29 DIAGNOSIS — E039 Hypothyroidism, unspecified: Secondary | ICD-10-CM | POA: Diagnosis not present

## 2017-03-30 LAB — HEMOGLOBIN A1C
HEMOGLOBIN A1C: 7.3 %{Hb} — AB (ref <5.7)
MEAN PLASMA GLUCOSE: 163 (calc)
eAG (mmol/L): 9 (calc)

## 2017-03-30 LAB — LIPID PANEL
CHOLESTEROL: 177 mg/dL (ref <200)
HDL: 57 mg/dL (ref >50)
LDL Cholesterol (Calc): 94 mg/dL (calc)
NON-HDL CHOLESTEROL (CALC): 120 mg/dL (ref <130)
Total CHOL/HDL Ratio: 3.1 (calc) (ref <5.0)
Triglycerides: 155 mg/dL — ABNORMAL HIGH (ref <150)

## 2017-03-30 LAB — CBC WITH DIFFERENTIAL/PLATELET
BASOS ABS: 91 {cells}/uL (ref 0–200)
Basophils Relative: 1 %
EOS PCT: 3.6 %
Eosinophils Absolute: 328 cells/uL (ref 15–500)
HCT: 43.7 % (ref 35.0–45.0)
Hemoglobin: 14.6 g/dL (ref 11.7–15.5)
Lymphs Abs: 2321 cells/uL (ref 850–3900)
MCH: 28.5 pg (ref 27.0–33.0)
MCHC: 33.4 g/dL (ref 32.0–36.0)
MCV: 85.4 fL (ref 80.0–100.0)
MONOS PCT: 5.9 %
MPV: 10.6 fL (ref 7.5–12.5)
Neutro Abs: 5824 cells/uL (ref 1500–7800)
Neutrophils Relative %: 64 %
Platelets: 408 10*3/uL — ABNORMAL HIGH (ref 140–400)
RBC: 5.12 10*6/uL — ABNORMAL HIGH (ref 3.80–5.10)
RDW: 12.6 % (ref 11.0–15.0)
TOTAL LYMPHOCYTE: 25.5 %
WBC mixed population: 537 cells/uL (ref 200–950)
WBC: 9.1 10*3/uL (ref 3.8–10.8)

## 2017-03-30 LAB — BASIC METABOLIC PANEL WITH GFR
BUN/Creatinine Ratio: 19 (calc) (ref 6–22)
BUN: 21 mg/dL (ref 7–25)
CO2: 28 mmol/L (ref 20–32)
Calcium: 9.7 mg/dL (ref 8.6–10.4)
Chloride: 101 mmol/L (ref 98–110)
Creat: 1.13 mg/dL — ABNORMAL HIGH (ref 0.50–0.99)
GFR, Est African American: 57 mL/min/{1.73_m2} — ABNORMAL LOW (ref >=60)
GFR, Est Non African American: 50 mL/min/{1.73_m2} — ABNORMAL LOW (ref >=60)
GLUCOSE: 147 mg/dL — AB (ref 65–99)
POTASSIUM: 4.9 mmol/L (ref 3.5–5.3)
SODIUM: 139 mmol/L (ref 135–146)

## 2017-03-30 LAB — HEPATIC FUNCTION PANEL
AG Ratio: 1.7 (calc) (ref 1.0–2.5)
ALKALINE PHOSPHATASE (APISO): 87 U/L (ref 33–130)
ALT: 13 U/L (ref 6–29)
AST: 14 U/L (ref 10–35)
Albumin: 4.4 g/dL (ref 3.6–5.1)
BILIRUBIN INDIRECT: 0.3 mg/dL (ref 0.2–1.2)
BILIRUBIN TOTAL: 0.4 mg/dL (ref 0.2–1.2)
Bilirubin, Direct: 0.1 mg/dL (ref 0.0–0.2)
Globulin: 2.6 g/dL (calc) (ref 1.9–3.7)
Total Protein: 7 g/dL (ref 6.1–8.1)

## 2017-03-30 LAB — INSULIN, RANDOM: Insulin: 4.5 u[IU]/mL (ref 2.0–19.6)

## 2017-03-30 LAB — VITAMIN D 25 HYDROXY (VIT D DEFICIENCY, FRACTURES): Vit D, 25-Hydroxy: 50 ng/mL (ref 30–100)

## 2017-03-30 LAB — MAGNESIUM: Magnesium: 2.2 mg/dL (ref 1.5–2.5)

## 2017-03-30 LAB — TSH: TSH: 2.69 mIU/L (ref 0.40–4.50)

## 2017-04-09 ENCOUNTER — Encounter: Payer: Self-pay | Admitting: *Deleted

## 2017-04-13 ENCOUNTER — Other Ambulatory Visit: Payer: Self-pay | Admitting: Physician Assistant

## 2017-04-13 DIAGNOSIS — I10 Essential (primary) hypertension: Secondary | ICD-10-CM

## 2017-04-19 ENCOUNTER — Other Ambulatory Visit: Payer: Self-pay | Admitting: *Deleted

## 2017-04-19 MED ORDER — TRAZODONE HCL 150 MG PO TABS: 150.0000 mg | ORAL_TABLET | Freq: Every day | ORAL | 2 refills | Status: DC

## 2017-06-27 DIAGNOSIS — N183 Chronic kidney disease, stage 3 (moderate): Secondary | ICD-10-CM

## 2017-06-27 DIAGNOSIS — Z6820 Body mass index (BMI) 20.0-20.9, adult: Secondary | ICD-10-CM | POA: Insufficient documentation

## 2017-06-27 DIAGNOSIS — E1122 Type 2 diabetes mellitus with diabetic chronic kidney disease: Secondary | ICD-10-CM | POA: Insufficient documentation

## 2017-06-27 DIAGNOSIS — Z6822 Body mass index (BMI) 22.0-22.9, adult: Secondary | ICD-10-CM | POA: Insufficient documentation

## 2017-06-27 DIAGNOSIS — Z6824 Body mass index (BMI) 24.0-24.9, adult: Secondary | ICD-10-CM | POA: Insufficient documentation

## 2017-06-27 NOTE — Progress Notes (Signed)
FOLLOW UP  Assessment and Plan:   Hypertension Well controlled with current medications  Monitor blood pressure at home; patient to call if consistently greater than 130/80 Continue DASH diet.   Reminder to go to the ER if any CP, SOB, nausea, dizziness, severe HA, changes vision/speech, left arm numbness and tingling and jaw pain.  Cholesterol Currently remains above goal of LDL <100; goal discussed, diet discussed at length She is agreeable to increasing atorvastatin to 40 mg daily - discussed to report any SE notably myalgias/muscle weakness Continue low cholesterol diet and exercise.  Check lipid panel.   Diabetes with diabetic chronic kidney disease Continue medication: metformin, glipizide Continue diet and exercise.  Perform daily foot/skin check, notify office of any concerning changes.  Check A1C  BMI 25  Long discussion about weight loss, diet, and exercise Recommended diet heavy in fruits and veggies and low in animal meats, cheeses, and dairy products, appropriate calorie intake Discussed ideal weight for height  Will follow up in 3 months  Hypothyroidism continue medications the same pending lab results reminded to take on an empty stomach 30-13mins before food.  check TSH level  Vitamin D Def Near goal at last visit; continue supplementation to maintain goal of 70-100 (advised to double current dose, which she is unsure of exact amount, not on med list) Defer Vit D level  Mid back pain Continue tylenol for mild/moderate pain, take tramadol only as needed for severe pain Tramadol provided after extensive discussion of new guidelines; risks and SE discussed, patient expresses understanding  Continue diet and meds as discussed. Further disposition pending results of labs. Discussed med's effects and SE's.   Over 30 minutes of exam, counseling, chart review, and critical decision making was performed.   Future Appointments  Date Time Provider Department Center   10/28/2017 11:00 AM Judd Gaudier, NP [facility] None  12/21/2017  2:00 PM Quentin Mulling, PA-C [facility] None    ----------------------------------------------------------------------------------------------------------------------  HPI 72 y.o. female  presents for 3 month follow up on hypertension, cholesterol, diabetes, weight and vitamin D deficiency. She has hx of upper back pain with cervical radiculopathy at C6 which required surgical intervention (anterior cervical decompression/fusion in 2013); her pain is typically well managed by PRN tylenol, but reports she struggles on long car trips and air travel. She has been prescribed tramadol remotely and uses this very rarely to help manage this pain with travel. She is requesting refill of this today (last refill over a year ago); discussed new guidelines, and should she need this on a regular basis will need to discuss with ortho or pain management.   BMI is Body mass index is 25.11 kg/m., she has been working on diet and exercise. Wt Readings from Last 3 Encounters:  06/28/17 144 lb (65.3 kg)  03/29/17 147 lb 9.6 oz (67 kg)  12/15/16 145 lb 6.4 oz (66 kg)   Her blood pressure has been controlled at home, today their BP is BP: 126/82  She does workout. She denies chest pain, shortness of breath, dizziness.   She is on cholesterol medication (atorvastatin 10 mg dialy) and denies myalgias. Her cholesterol is not at goal. The cholesterol last visit was:   Lab Results  Component Value Date   CHOL 177 03/29/2017   HDL 57 03/29/2017   LDLCALC 94 03/29/2017   TRIG 155 (H) 03/29/2017   CHOLHDL 3.1 03/29/2017    She has been working on diet and exercise for T2 diabetes, and denies foot ulcerations, increased appetite,  nausea, paresthesia of the feet, polydipsia, polyuria, visual disturbances, vomiting and weight loss. She does check an occasional fasting which has ranged 110-158 these past months, which is consistent with previous. Last  A1C in the office was:  Lab Results  Component Value Date   HGBA1C 7.3 (H) 03/29/2017   She is on thyroid medication. Her medication was not changed last visit.   Lab Results  Component Value Date   TSH 2.69 03/29/2017   Patient is on Vitamin D supplement but remained below goal of 70 at last check:    Lab Results  Component Value Date   VD25OH 50 03/29/2017     Vitamin D dose was not changed.    Current Medications:  Current Outpatient Medications on File Prior to Visit  Medication Sig  . albuterol (PROVENTIL HFA;VENTOLIN HFA) 108 (90 Base) MCG/ACT inhaler Inhale 2 puffs into the lungs every 6 (six) hours as needed for wheezing or shortness of breath (cough).  Marland Kitchen. amLODipine (NORVASC) 5 MG tablet TAKE 1 TABLET BY MOUTH  DAILY  . atorvastatin (LIPITOR) 10 MG tablet TAKE 1 TABLET BY MOUTH  DAILY  . azelastine (ASTELIN) 0.1 % nasal spray Place 2 sprays into both nostrils 2 (two) times daily. Use in each nostril as directed  . Biotin 10 MG TABS Take by mouth daily.  . Calcium Carbonate-Vitamin D (CALCIUM-VITAMIN D) 500-200 MG-UNIT per tablet Take 1 tablet by mouth 2 (two) times daily with a meal.  . CINNAMON PO Take 1,000 Units by mouth daily. 2 capsules daily  . fluticasone (FLONASE) 50 MCG/ACT nasal spray Place 2 sprays into both nostrils daily.  Marland Kitchen. glipiZIDE (GLUCOTROL) 5 MG tablet TAKE 1 TABLET BY MOUTH  TWICE A DAY BEFORE MEALS  . levothyroxine (SYNTHROID, LEVOTHROID) 50 MCG tablet TAKE 1 TABLET EVERY MORNING ON AN EMPTY STOMACH WITH  ONLY WATER FOR 30 MINUTES  . lisinopril-hydrochlorothiazide (PRINZIDE,ZESTORETIC) 20-25 MG tablet TAKE 1 TABLET DAILY FOR  BLOOD PRESSURE  . Magnesium 250 MG TABS Take 250 mg by mouth daily.  . metFORMIN (GLUCOPHAGE-XR) 500 MG 24 hr tablet TAKE 2 TABLETS BY MOUTH  TWICE A DAY WITH MEALS FOR  DIABETES  . Omega-3 Fatty Acids (FISH OIL) 1000 MG CAPS Take by mouth daily.  . traZODone (DESYREL) 150 MG tablet Take 1 tablet (150 mg total) by mouth at bedtime.  Take 1/2 to 1 tablet at bedtime as needed for sleep  . ranitidine (ZANTAC) 150 MG tablet Take 1 tablet (150 mg total) by mouth 2 (two) times daily. (Patient not taking: Reported on 06/28/2017)   No current facility-administered medications on file prior to visit.      Allergies: No Known Allergies   Medical History:  Past Medical History:  Diagnosis Date  . Diabetes mellitus   . Headache(784.0)    hx migraines  . Hyperlipidemia   . Hypertension   . Hypothyroidism    Family history- Reviewed and unchanged Social history- Reviewed and unchanged   Review of Systems:  Review of Systems  Constitutional: Negative for malaise/fatigue and weight loss.  HENT: Negative for hearing loss and tinnitus.   Eyes: Negative for blurred vision and double vision.  Respiratory: Negative for cough, shortness of breath and wheezing.   Cardiovascular: Negative for chest pain, palpitations, orthopnea, claudication and leg swelling.  Gastrointestinal: Negative for abdominal pain, blood in stool, constipation, diarrhea, heartburn, melena, nausea and vomiting.  Genitourinary: Negative.   Musculoskeletal: Negative for joint pain and myalgias.  Skin: Negative for rash.  Neurological:  Negative for dizziness, tingling, sensory change, weakness and headaches.  Endo/Heme/Allergies: Negative for polydipsia.  Psychiatric/Behavioral: Negative.   All other systems reviewed and are negative.    Physical Exam: BP 126/82   Pulse 73   Temp 97.6 F (36.4 C)   Ht 5' 3.5" (1.613 m)   Wt 144 lb (65.3 kg)   SpO2 98%   BMI 25.11 kg/m  Wt Readings from Last 3 Encounters:  06/28/17 144 lb (65.3 kg)  03/29/17 147 lb 9.6 oz (67 kg)  12/15/16 145 lb 6.4 oz (66 kg)   General Appearance: Well nourished, in no apparent distress. Eyes: PERRLA, EOMs, conjunctiva no swelling or erythema Sinuses: No Frontal/maxillary tenderness ENT/Mouth: Ext aud canals clear, TMs without erythema, bulging. No erythema, swelling, or  exudate on post pharynx.  Tonsils not swollen or erythematous. Hearing normal.  Neck: Supple, thyroid normal.  Respiratory: Respiratory effort normal, BS equal bilaterally without rales, rhonchi, wheezing or stridor.  Cardio: RRR with no MRGs. Brisk peripheral pulses without edema.  Abdomen: Soft, + BS.  Non tender, no guarding, rebound, hernias, masses. Lymphatics: Non tender without lymphadenopathy.  Musculoskeletal: Full ROM, 5/5 strength, Normal gait Skin: Warm, dry without rashes, lesions, ecchymosis.  Neuro: Cranial nerves intact. No cerebellar symptoms.  Psych: Awake and oriented X 3, normal affect, Insight and Judgment appropriate.    Dan Maker, NP 11:32 AM [facility]

## 2017-06-28 ENCOUNTER — Encounter: Payer: Self-pay | Admitting: Adult Health

## 2017-06-28 ENCOUNTER — Ambulatory Visit: Payer: Medicare Other | Admitting: Adult Health

## 2017-06-28 VITALS — BP 126/82 | HR 73 | Temp 97.6°F | Ht 63.5 in | Wt 144.0 lb

## 2017-06-28 DIAGNOSIS — I10 Essential (primary) hypertension: Secondary | ICD-10-CM

## 2017-06-28 DIAGNOSIS — E039 Hypothyroidism, unspecified: Secondary | ICD-10-CM | POA: Diagnosis not present

## 2017-06-28 DIAGNOSIS — E1122 Type 2 diabetes mellitus with diabetic chronic kidney disease: Secondary | ICD-10-CM | POA: Diagnosis not present

## 2017-06-28 DIAGNOSIS — Z79899 Other long term (current) drug therapy: Secondary | ICD-10-CM

## 2017-06-28 DIAGNOSIS — N182 Chronic kidney disease, stage 2 (mild): Secondary | ICD-10-CM

## 2017-06-28 DIAGNOSIS — Z6825 Body mass index (BMI) 25.0-25.9, adult: Secondary | ICD-10-CM | POA: Diagnosis not present

## 2017-06-28 DIAGNOSIS — E559 Vitamin D deficiency, unspecified: Secondary | ICD-10-CM

## 2017-06-28 DIAGNOSIS — E782 Mixed hyperlipidemia: Secondary | ICD-10-CM | POA: Diagnosis not present

## 2017-06-28 DIAGNOSIS — M546 Pain in thoracic spine: Secondary | ICD-10-CM | POA: Diagnosis not present

## 2017-06-28 MED ORDER — ATORVASTATIN CALCIUM 40 MG PO TABS: 40.0000 mg | ORAL_TABLET | Freq: Every day | ORAL | 1 refills | Status: DC

## 2017-06-28 MED ORDER — TRAMADOL HCL 50 MG PO TABS: ORAL_TABLET | ORAL | 0 refills | Status: DC

## 2017-06-29 ENCOUNTER — Other Ambulatory Visit: Payer: Self-pay | Admitting: Adult Health

## 2017-06-29 DIAGNOSIS — E1122 Type 2 diabetes mellitus with diabetic chronic kidney disease: Secondary | ICD-10-CM

## 2017-06-29 DIAGNOSIS — N183 Chronic kidney disease, stage 3 unspecified: Secondary | ICD-10-CM

## 2017-06-29 LAB — HEPATIC FUNCTION PANEL
AG Ratio: 2 (calc) (ref 1.0–2.5)
ALBUMIN MSPROF: 4.7 g/dL (ref 3.6–5.1)
ALT: 9 U/L (ref 6–29)
AST: 14 U/L (ref 10–35)
Alkaline phosphatase (APISO): 72 U/L (ref 33–130)
Bilirubin, Direct: 0.1 mg/dL (ref 0.0–0.2)
GLOBULIN: 2.3 g/dL (ref 1.9–3.7)
Indirect Bilirubin: 0.3 mg/dL (calc) (ref 0.2–1.2)
TOTAL PROTEIN: 7 g/dL (ref 6.1–8.1)
Total Bilirubin: 0.4 mg/dL (ref 0.2–1.2)

## 2017-06-29 LAB — CBC WITH DIFFERENTIAL/PLATELET
BASOS PCT: 0.9 %
Basophils Absolute: 77 cells/uL (ref 0–200)
Eosinophils Absolute: 170 cells/uL (ref 15–500)
Eosinophils Relative: 2 %
HCT: 43.9 % (ref 35.0–45.0)
HEMOGLOBIN: 15.1 g/dL (ref 11.7–15.5)
Lymphs Abs: 2406 cells/uL (ref 850–3900)
MCH: 28.8 pg (ref 27.0–33.0)
MCHC: 34.4 g/dL (ref 32.0–36.0)
MCV: 83.6 fL (ref 80.0–100.0)
MONOS PCT: 4.9 %
MPV: 10.4 fL (ref 7.5–12.5)
NEUTROS ABS: 5432 {cells}/uL (ref 1500–7800)
Neutrophils Relative %: 63.9 %
PLATELETS: 409 10*3/uL — AB (ref 140–400)
RBC: 5.25 10*6/uL — AB (ref 3.80–5.10)
RDW: 12.9 % (ref 11.0–15.0)
TOTAL LYMPHOCYTE: 28.3 %
WBC: 8.5 10*3/uL (ref 3.8–10.8)
WBCMIX: 417 {cells}/uL (ref 200–950)

## 2017-06-29 LAB — BASIC METABOLIC PANEL WITH GFR
BUN/Creatinine Ratio: 22 (calc) (ref 6–22)
BUN: 31 mg/dL — AB (ref 7–25)
CALCIUM: 10.2 mg/dL (ref 8.6–10.4)
CHLORIDE: 102 mmol/L (ref 98–110)
CO2: 27 mmol/L (ref 20–32)
Creat: 1.43 mg/dL — ABNORMAL HIGH (ref 0.60–0.93)
GFR, Est African American: 43 mL/min/{1.73_m2} — ABNORMAL LOW (ref >=60)
GFR, Est Non African American: 37 mL/min/{1.73_m2} — ABNORMAL LOW (ref >=60)
GLUCOSE: 126 mg/dL — AB (ref 65–99)
Potassium: 4.5 mmol/L (ref 3.5–5.3)
Sodium: 138 mmol/L (ref 135–146)

## 2017-06-29 LAB — LIPID PANEL
CHOL/HDL RATIO: 4.1 (calc) (ref <5.0)
CHOLESTEROL: 208 mg/dL — AB (ref <200)
HDL: 51 mg/dL (ref >50)
LDL CHOLESTEROL (CALC): 130 mg/dL — AB
Non-HDL Cholesterol (Calc): 157 mg/dL (calc) — ABNORMAL HIGH (ref <130)
TRIGLYCERIDES: 154 mg/dL — AB (ref <150)

## 2017-06-29 LAB — TSH: TSH: 2.01 m[IU]/L (ref 0.40–4.50)

## 2017-06-29 LAB — HEMOGLOBIN A1C
EAG (MMOL/L): 8.9 (calc)
HEMOGLOBIN A1C: 7.2 %{Hb} — AB (ref <5.7)
MEAN PLASMA GLUCOSE: 160 (calc)

## 2017-07-28 ENCOUNTER — Ambulatory Visit: Payer: Self-pay

## 2017-08-26 ENCOUNTER — Other Ambulatory Visit: Payer: Self-pay | Admitting: *Deleted

## 2017-08-26 MED ORDER — GLIPIZIDE 5 MG PO TABS: ORAL_TABLET | ORAL | 1 refills | Status: DC

## 2017-08-30 ENCOUNTER — Other Ambulatory Visit: Payer: Self-pay | Admitting: *Deleted

## 2017-08-31 ENCOUNTER — Other Ambulatory Visit: Payer: Self-pay | Admitting: *Deleted

## 2017-08-31 DIAGNOSIS — I10 Essential (primary) hypertension: Secondary | ICD-10-CM

## 2017-08-31 DIAGNOSIS — E782 Mixed hyperlipidemia: Secondary | ICD-10-CM

## 2017-08-31 MED ORDER — LISINOPRIL-HYDROCHLOROTHIAZIDE 20-25 MG PO TABS: 1.0000 | ORAL_TABLET | Freq: Every day | ORAL | 1 refills | Status: DC

## 2017-08-31 MED ORDER — AMLODIPINE BESYLATE 5 MG PO TABS: 5.0000 mg | ORAL_TABLET | Freq: Every day | ORAL | 1 refills | Status: DC

## 2017-08-31 MED ORDER — METFORMIN HCL ER 500 MG PO TB24: ORAL_TABLET | ORAL | 1 refills | Status: DC

## 2017-08-31 MED ORDER — ATORVASTATIN CALCIUM 40 MG PO TABS: 40.0000 mg | ORAL_TABLET | Freq: Every day | ORAL | 1 refills | Status: DC

## 2017-08-31 MED ORDER — TRAZODONE HCL 150 MG PO TABS: 150.0000 mg | ORAL_TABLET | Freq: Every day | ORAL | 2 refills | Status: DC

## 2017-08-31 MED ORDER — LEVOTHYROXINE SODIUM 50 MCG PO TABS: ORAL_TABLET | ORAL | 1 refills | Status: DC

## 2017-09-08 ENCOUNTER — Ambulatory Visit: Payer: Self-pay

## 2017-10-27 DIAGNOSIS — G4700 Insomnia, unspecified: Secondary | ICD-10-CM | POA: Insufficient documentation

## 2017-10-27 NOTE — Progress Notes (Signed)
Patient ID: Elizabeth May, female   DOB: 07-11-1945, 72 y.o.   MRN: [id number]   MEDICARE ANNUAL WELLNESS VISIT AND 3 MONTH FOLLOW UP  Assessment:   Encounter for Medicare annual wellness exam   Essential hypertension - continue medications, DASH diet, exercise and monitor at home. Call if greater than 130/80.  -     CBC with Differential/Platelet -     CMP/GFR -     TSH  Type 2 diabetes mellitus with stage 2 chronic kidney disease, without long-term current use of insulin (HCC) Discussed general issues about diabetes pathophysiology and management., Educational material distributed., Suggested low cholesterol diet., Encouraged aerobic exercise., Discussed foot care., Reminded to get yearly retinal exam. -     Hemoglobin A1c -     Foot exam completed   Hypothyroidism, unspecified type Hypothyroidism-check TSH level, continue medications the same, reminded to take on an empty stomach 30-55mins before food.  -     TSH  Cervical radiculopathy at C6 s/p fusion, takes tramadol PRN, mainly for travel  Mixed hyperlipidemia -continue medications, check lipids, decrease fatty foods, increase activity.  -     Lipid panel  Vitamin D deficiency At goal at recent check;  Defer vitamin D level  Insomnia Well managed by trazodone good sleep hygiene discussed, increase day time activity  Medication management -     Magnesium   Over 40 minutes of exam, counseling, chart review and critical decision making was performed Future Appointments  Date Time Provider Department Center  12/21/2017  2:00 PM Quentin Mulling, PA-C [facility] None    Plan:   During the course of the visit the patient was educated and counseled about appropriate screening and preventive services including:    Pneumococcal vaccine   Prevnar 13  Influenza vaccine  Td vaccine  Screening electrocardiogram  Bone densitometry screening  Colorectal cancer screening  Diabetes screening  Glaucoma  screening  Nutrition counseling   Advanced directives: requested  Subjective:  Elizabeth May is a 72 y.o. female who presents for Medicare Annual Wellness Visit and 3 month follow up for HTN, DM, chol. She will be traveling to [place] for a wedding soon.   BMI is Body mass index is 24.93 kg/m., she has been working on diet and exercise. Wt Readings from Last 3 Encounters:  10/28/17 143 lb (64.9 kg)  06/28/17 144 lb (65.3 kg)  03/29/17 147 lb 9.6 oz (67 kg)    Her blood pressure has been controlled at home, today their BP is BP: 134/82 She does workout. She denies chest pain, shortness of breath, dizziness.  She does walk on a treadmill and checks blood pressure occasionally at home.   Travels a lot to [place].   She is on cholesterol medication (atorvastatin 40 mg daily) and denies myalgias. Her cholesterol is at goal. The cholesterol last visit was:   Lab Results  Component Value Date   CHOL 208 (H) 06/28/2017   HDL 51 06/28/2017   LDLCALC 130 (H) 06/28/2017   TRIG 154 (H) 06/28/2017   CHOLHDL 4.1 06/28/2017   She has had diabetes. She has been working on diet and exercise for diabetes (metformin, glipizide), and denies foot ulcerations, hyperglycemia, hypoglycemia , increased appetite, nausea, paresthesia of the feet, polydipsia, polyuria, visual disturbances, vomiting and weight loss. She does check occasional fasting sugars, running 120-140 range. Last A1C in the office was:  Lab Results  Component Value Date   HGBA1C 7.2 (H) 06/28/2017    Lab Results  Component Value Date   GFRNONAA 37 (L) 06/28/2017   Patient is on Vitamin D supplement.   Lab Results  Component Value Date   VD25OH 50 03/29/2017     She is on thyroid medication. Her medication was not changed last visit.   Lab Results  Component Value Date   TSH 2.01 06/28/2017  .   Medication Review: Current Outpatient Medications on File Prior to Visit  Medication Sig Dispense Refill  . albuterol  (PROVENTIL HFA;VENTOLIN HFA) 108 (90 Base) MCG/ACT inhaler Inhale 2 puffs into the lungs every 6 (six) hours as needed for wheezing or shortness of breath (cough). 1 Inhaler 0  . amLODipine (NORVASC) 5 MG tablet Take 1 tablet (5 mg total) by mouth daily. 90 tablet 1  . atorvastatin (LIPITOR) 40 MG tablet Take 1 tablet (40 mg total) by mouth daily. 90 tablet 1  . azelastine (ASTELIN) 0.1 % nasal spray Place 2 sprays into both nostrils 2 (two) times daily. Use in each nostril as directed 30 mL 2  . Biotin 10 MG TABS Take by mouth daily.    . Calcium Carbonate-Vitamin D (CALCIUM-VITAMIN D) 500-200 MG-UNIT per tablet Take 1 tablet by mouth 2 (two) times daily with a meal.    . CINNAMON PO Take 1,000 Units by mouth daily. 2 capsules daily    . fluticasone (FLONASE) 50 MCG/ACT nasal spray Place 2 sprays into both nostrils daily. 16 g 0  . glipiZIDE (GLUCOTROL) 5 MG tablet TAKE 1 TABLET BY MOUTH  TWICE A DAY BEFORE MEALS 180 tablet 1  . levothyroxine (SYNTHROID, LEVOTHROID) 50 MCG tablet TAKE 1 TABLET EVERY MORNING ON AN EMPTY STOMACH WITH  ONLY WATER FOR 30 MINUTES 90 tablet 1  . lisinopril-hydrochlorothiazide (PRINZIDE,ZESTORETIC) 20-25 MG tablet Take 1 tablet by mouth daily. for blood pressure 90 tablet 1  . Magnesium 250 MG TABS Take 250 mg by mouth daily.    . metFORMIN (GLUCOPHAGE-XR) 500 MG 24 hr tablet TAKE 2 TABLETS BY MOUTH  TWICE A DAY WITH MEALS FOR  DIABETES 360 tablet 1  . Omega-3 Fatty Acids (FISH OIL) 1000 MG CAPS Take by mouth daily.    . traMADol (ULTRAM) 50 MG tablet Take 1 tab as needed up to every 6 hours for severe pain. 20 tablet 0  . traZODone (DESYREL) 150 MG tablet Take 1 tablet (150 mg total) by mouth at bedtime. Take 1/2 to 1 tablet at bedtime as needed for sleep 90 tablet 2   No current facility-administered medications on file prior to visit.     Current Problems (verified) Patient Active Problem List   Diagnosis Date Noted  . Insomnia 10/27/2017  . CKD stage 3 due to  type 2 diabetes mellitus (HCC) 06/27/2017  . BMI 25.0-25.9,adult 06/27/2017  . Medication management 03/08/2014  . Mixed hyperlipidemia 03/21/2013  . Vitamin D deficiency 03/21/2013  . Type 2 diabetes mellitus (HCC) 03/17/2013  . Hypothyroidism   . Cervical radiculopathy at C6 10/22/2011  . Essential hypertension 07/01/2009    Screening Tests Immunization History  Administered Date(s) Administered  . Influenza Split 02/13/2013  . Influenza, High Dose Seasonal PF 12/07/2014, 12/09/2015, 12/15/2016  . Influenza-Unspecified 03/26/2014  . Pneumococcal Conjugate-13 12/09/2015  . Pneumococcal-Unspecified 03/23/2008  . Tdap 07/17/2010  . Zoster 01/14/2012    Preventative care: Last colonoscopy: 2006 cologuard 12/2015 by Dr. Loreta AveMann, neg Last mammogram: Jan 2017 q 2 years - [facility] - [facility] - reports requested DEXA:2013, will order  Echo 2011 CXR 2013 Cervival neck 2013  Immunizations up to date  Names of Other Physician/Practitioners you currently use: 1. [facility] here for primary care 2. Dr. Caryn Section at [facility], eye doctor, last visit 12/21/2016 - report received and abstracted 3. Dr. Dulce Sellar, dentist, last visit 2018  Patient Care Team: [name], MD as PCP - General (Internal Medicine) Kathleene Hazel, MD as Consulting Physician (Cardiology) Charna Elizabeth, MD as Consulting Physician (Gastroenterology) Estill Bamberg, MD as Consulting Physician (Orthopedic Surgery) Freddy Finner, MD as Consulting Physician (Obstetrics and Gynecology)  Allergies No Known Allergies  SURGICAL HISTORY She  has a past surgical history that includes Breast surgery (80's) and Anterior cervical decomp/discectomy fusion (10/22/2011). FAMILY HISTORY Her family history includes Drug abuse in her maternal grandmother; Heart disease in her mother; Hyperlipidemia in her mother; Hypertension in her mother. SOCIAL HISTORY She  reports that she has  never smoked. She has never used smokeless tobacco. She reports that she drinks about 7.0 standard drinks of alcohol per week. She reports that she does not use drugs.   MEDICARE WELLNESS OBJECTIVES: Physical activity: Current Exercise Habits: Home exercise routine, Type of exercise: walking, Time (Minutes): 60, Frequency (Times/Week): 5, Weekly Exercise (Minutes/Week): 300, Intensity: Mild, Exercise limited by: None identified Cardiac risk factors: Cardiac Risk Factors include: advanced age (>22men, >23 women);diabetes mellitus;dyslipidemia;hypertension Depression/mood screen:   Depression screen [facility] 2/9 10/28/2017  Decreased Interest 0  Down, Depressed, Hopeless 0  PHQ - 2 Score 0    ADLs:  In your present state of health, do you have any difficulty performing the following activities: 10/28/2017 03/29/2017  Hearing? N N  Vision? N N  Difficulty concentrating or making decisions? N N  Walking or climbing stairs? N N  Dressing or bathing? N N  Doing errands, shopping? N N  Some recent data might be hidden     Cognitive Testing  Alert? Yes  Normal Appearance?Yes  Oriented to person? Yes  Place? Yes   Time? Yes  Recall of three objects?  Yes  Can perform simple calculations? Yes  Displays appropriate judgment?Yes  Can read the correct time from a watch face?Yes  EOL planning: Does Patient Have a Medical Advance Directive?: No Would patient like information on creating a medical advance directive?: No - Patient declined  Review of Systems  Constitutional: Negative for chills, fever and malaise/fatigue.  HENT: Negative for congestion, ear pain, hearing loss, nosebleeds, sore throat and tinnitus.   Eyes: Negative for blurred vision and double vision.  Respiratory: Negative for cough, shortness of breath and wheezing.   Cardiovascular: Negative for chest pain, palpitations and leg swelling.  Gastrointestinal: Negative for blood in stool, constipation, diarrhea, heartburn and melena.   Genitourinary: Negative.   Neurological: Negative for dizziness, sensory change, loss of consciousness and headaches.  Psychiatric/Behavioral: Negative for depression. The patient has insomnia. The patient is not nervous/anxious.      Objective:     Today's Vitals   10/28/17 1101  BP: 134/82  Pulse: 78  Temp: (!) 97.3 F (36.3 C)  SpO2: 98%  Weight: 143 lb (64.9 kg)  Height: 5' 3.5" (1.613 m)   Body mass index is 24.93 kg/m.  General appearance: alert, no distress, WD/WN, female HEENT: normocephalic, sclerae anicteric, TMs pearly, nares patent, no discharge or erythema, pharynx normal Oral cavity: MMM, no lesions Neck: supple, no lymphadenopathy, no thyromegaly, no masses Heart: RRR, normal S1, S2, no murmurs Lungs: CTA bilaterally, no wheezes, rhonchi, or rales Abdomen: +bs, soft, non tender,  non distended, no masses, no hepatomegaly, no splenomegaly Musculoskeletal: nontender, no swelling, no obvious deformity Extremities: no edema, no cyanosis, no clubbing Pulses: 2+ symmetric, upper and lower extremities, normal cap refill Neurological: alert, oriented x 3, CN2-12 intact, strength normal upper extremities and lower extremities, sensation normal throughout, DTRs 2+ throughout, no cerebellar signs, gait normal Psychiatric: normal affect, behavior normal, pleasant   Medicare Attestation I have personally reviewed: The patient's medical and social history Their use of alcohol, tobacco or illicit drugs Their current medications and supplements The patient's functional ability including ADLs,fall risks, home safety risks, cognitive, and hearing and visual impairment Diet and physical activities Evidence for depression or mood disorders  The patient's weight, height, BMI, and visual acuity have been recorded in the chart.  I have made referrals, counseling, and provided education to the patient based on review of the above and I have provided the patient with a written  personalized care plan for preventive services.     Dan Maker, NP   10/28/2017

## 2017-10-28 ENCOUNTER — Ambulatory Visit: Payer: Medicare Other | Admitting: Adult Health

## 2017-10-28 ENCOUNTER — Encounter: Payer: Self-pay | Admitting: Adult Health

## 2017-10-28 VITALS — BP 134/82 | HR 78 | Temp 97.3°F | Ht 63.5 in | Wt 143.0 lb

## 2017-10-28 DIAGNOSIS — Z6825 Body mass index (BMI) 25.0-25.9, adult: Secondary | ICD-10-CM

## 2017-10-28 DIAGNOSIS — M5412 Radiculopathy, cervical region: Secondary | ICD-10-CM

## 2017-10-28 DIAGNOSIS — Z79899 Other long term (current) drug therapy: Secondary | ICD-10-CM

## 2017-10-28 DIAGNOSIS — E1122 Type 2 diabetes mellitus with diabetic chronic kidney disease: Secondary | ICD-10-CM

## 2017-10-28 DIAGNOSIS — Z0000 Encounter for general adult medical examination without abnormal findings: Secondary | ICD-10-CM | POA: Diagnosis not present

## 2017-10-28 DIAGNOSIS — I10 Essential (primary) hypertension: Secondary | ICD-10-CM

## 2017-10-28 DIAGNOSIS — E782 Mixed hyperlipidemia: Secondary | ICD-10-CM

## 2017-10-28 DIAGNOSIS — Z1211 Encounter for screening for malignant neoplasm of colon: Secondary | ICD-10-CM

## 2017-10-28 DIAGNOSIS — E039 Hypothyroidism, unspecified: Secondary | ICD-10-CM | POA: Diagnosis not present

## 2017-10-28 DIAGNOSIS — G4700 Insomnia, unspecified: Secondary | ICD-10-CM

## 2017-10-28 DIAGNOSIS — N183 Chronic kidney disease, stage 3 unspecified: Secondary | ICD-10-CM

## 2017-10-28 DIAGNOSIS — M8580 Other specified disorders of bone density and structure, unspecified site: Secondary | ICD-10-CM

## 2017-10-28 DIAGNOSIS — E559 Vitamin D deficiency, unspecified: Secondary | ICD-10-CM

## 2017-10-28 NOTE — Patient Instructions (Addendum)
Know what a healthy weight is for you (roughly BMI <25) and aim to maintain this  Aim for 7+ servings of fruits and vegetables daily  65-80+ fluid ounces of water or unsweet tea for healthy kidneys  Limit to max 1 drink of alcohol per day; avoid smoking/tobacco  Limit animal fats in diet for cholesterol and heart health - choose grass fed whenever available  Avoid highly processed foods, and foods high in saturated/trans fats  Aim for low stress - take time to unwind and care for your mental health  Aim for 150 min of moderate intensity exercise weekly for heart health, and weights twice weekly for bone health  Aim for 7-9 hours of sleep daily       When it comes to diets, agreement about the perfect plan isn't easy to find, even among the experts. Experts at the [employer] developed an idea known as the Healthy Eating Plate. Just imagine a plate divided into logical, healthy portions.  The emphasis is on diet quality:  Load up on vegetables and fruits - one-half of your plate: Aim for color and variety, and remember that potatoes don't count.  Go for whole grains - one-quarter of your plate: Whole wheat, barley, wheat berries, quinoa, oats, brown rice, and foods made with them. If you want pasta, go with whole wheat pasta.  Protein power - one-quarter of your plate: Fish, chicken, beans, and nuts are all healthy, versatile protein sources. Limit red meat.  The diet, however, does go beyond the plate, offering a few other suggestions.  Use healthy plant oils, such as olive, canola, soy, corn, sunflower and peanut. Check the labels, and avoid partially hydrogenated oil, which have unhealthy trans fats.  If you're thirsty, drink water. Coffee and tea are good in moderation, but skip sugary drinks and limit milk and dairy products to one or two daily servings.  The type of carbohydrate in the diet is more important than the amount. Some sources of  carbohydrates, such as vegetables, fruits, whole grains, and beans-are healthier than others.  Finally, stay active.     

## 2017-10-29 ENCOUNTER — Encounter: Payer: Self-pay | Admitting: Adult Health

## 2017-10-29 LAB — CBC WITH DIFFERENTIAL/PLATELET
BASOS ABS: 64 {cells}/uL (ref 0–200)
Basophils Relative: 0.6 %
EOS ABS: 107 {cells}/uL (ref 15–500)
EOS PCT: 1 %
HEMATOCRIT: 45.7 % — AB (ref 35.0–45.0)
HEMOGLOBIN: 14.9 g/dL (ref 11.7–15.5)
LYMPHS ABS: 2097 {cells}/uL (ref 850–3900)
MCH: 28.4 pg (ref 27.0–33.0)
MCHC: 32.6 g/dL (ref 32.0–36.0)
MCV: 87 fL (ref 80.0–100.0)
MPV: 10.7 fL (ref 7.5–12.5)
Monocytes Relative: 4.4 %
NEUTROS ABS: 7961 {cells}/uL — AB (ref 1500–7800)
NEUTROS PCT: 74.4 %
Platelets: 426 10*3/uL — ABNORMAL HIGH (ref 140–400)
RBC: 5.25 10*6/uL — ABNORMAL HIGH (ref 3.80–5.10)
RDW: 12.5 % (ref 11.0–15.0)
Total Lymphocyte: 19.6 %
WBC: 10.7 10*3/uL (ref 3.8–10.8)
WBCMIX: 471 {cells}/uL (ref 200–950)

## 2017-10-29 LAB — COMPLETE METABOLIC PANEL WITH GFR
AG RATIO: 1.8 (calc) (ref 1.0–2.5)
ALT: 17 U/L (ref 6–29)
AST: 17 U/L (ref 10–35)
Albumin: 4.6 g/dL (ref 3.6–5.1)
Alkaline phosphatase (APISO): 89 U/L (ref 33–130)
BUN/Creatinine Ratio: 16 (calc) (ref 6–22)
BUN: 19 mg/dL (ref 7–25)
CALCIUM: 10.2 mg/dL (ref 8.6–10.4)
CO2: 28 mmol/L (ref 20–32)
Chloride: 103 mmol/L (ref 98–110)
Creat: 1.2 mg/dL — ABNORMAL HIGH (ref 0.60–0.93)
GFR, EST AFRICAN AMERICAN: 53 mL/min/{1.73_m2} — AB (ref >=60)
GFR, EST NON AFRICAN AMERICAN: 46 mL/min/{1.73_m2} — AB (ref >=60)
GLUCOSE: 242 mg/dL — AB (ref 65–99)
Globulin: 2.6 g/dL (calc) (ref 1.9–3.7)
Potassium: 5.4 mmol/L — ABNORMAL HIGH (ref 3.5–5.3)
Sodium: 143 mmol/L (ref 135–146)
TOTAL PROTEIN: 7.2 g/dL (ref 6.1–8.1)
Total Bilirubin: 0.6 mg/dL (ref 0.2–1.2)

## 2017-10-29 LAB — TSH: TSH: 1.87 mIU/L (ref 0.40–4.50)

## 2017-10-29 LAB — HEMOGLOBIN A1C
Hgb A1c MFr Bld: 7.4 % of total Hgb — ABNORMAL HIGH (ref <5.7)
Mean Plasma Glucose: 166 (calc)
eAG (mmol/L): 9.2 (calc)

## 2017-10-29 LAB — LIPID PANEL
CHOL/HDL RATIO: 2.3 (calc) (ref <5.0)
Cholesterol: 130 mg/dL (ref <200)
HDL: 57 mg/dL (ref >50)
LDL CHOLESTEROL (CALC): 53 mg/dL
NON-HDL CHOLESTEROL (CALC): 73 mg/dL (ref <130)
TRIGLYCERIDES: 112 mg/dL (ref <150)

## 2017-10-29 LAB — MAGNESIUM: Magnesium: 2.2 mg/dL (ref 1.5–2.5)

## 2017-12-21 ENCOUNTER — Encounter: Payer: Self-pay | Admitting: Physician Assistant

## 2018-01-13 ENCOUNTER — Other Ambulatory Visit: Payer: Self-pay | Admitting: Adult Health

## 2018-01-18 ENCOUNTER — Other Ambulatory Visit: Payer: Self-pay | Admitting: Internal Medicine

## 2018-01-18 DIAGNOSIS — E039 Hypothyroidism, unspecified: Secondary | ICD-10-CM

## 2018-01-18 DIAGNOSIS — N183 Chronic kidney disease, stage 3 (moderate): Secondary | ICD-10-CM

## 2018-01-18 DIAGNOSIS — I10 Essential (primary) hypertension: Secondary | ICD-10-CM

## 2018-01-18 DIAGNOSIS — E1122 Type 2 diabetes mellitus with diabetic chronic kidney disease: Secondary | ICD-10-CM

## 2018-02-07 NOTE — Progress Notes (Signed)
Patient ID: Elizabeth May, female   DOB: [birth year], 72 y.o.   MRN: [id number]   CPE AND 3 MONTH FOLLOW UP  Assessment and Plan    Essential hypertension - continue medications, DASH diet, exercise and monitor at home. Call if greater than 130/80.  -     CBC with Differential/Platelet -     CMP/GFR -     TSH  RBBB Monitor  Type 2 diabetes mellitus with stage 2 chronic kidney disease, without long-term current use of insulin (HCC) Discussed general issues about diabetes pathophysiology and management., Educational material distributed., Suggested low cholesterol diet., Encouraged aerobic exercise., Discussed foot care., Reminded to get yearly retinal exam. -     Hemoglobin A1c  Hypothyroidism, unspecified type Hypothyroidism-check TSH level, continue medications the same, reminded to take on an empty stomach 30-44mins before food.  -     TSH  Cervical radiculopathy at C6 s/p fusion, takes tramadol PRN, mainly for travel  Mixed hyperlipidemia -continue medications, check lipids, decrease fatty foods, increase activity.  -     Lipid panel  Vitamin D deficiency Continue supplementation Check vitamin D level  Insomnia - trazodone is beneficial - good sleep hygiene discussed, increase day time activity  Medication management -     Magnesium  Needs flu shot -     Flu vaccine HIGH DOSE PF   Over 40 minutes of exam, counseling, chart review and critical decision making was performed Future Appointments  Date Time Provider Department Center  11/04/2018 11:15 AM Judd Gaudier, NP [facility] None  02/20/2019 10:00 AM Judd Gaudier, NP [facility] None    Subjective:  Elizabeth May is a 72 y.o. female who presents for CPE and 3 month follow up for HTN, DM, chol. She has Essential hypertension; Cervical radiculopathy at C6; Type 2 diabetes mellitus (HCC); Hypothyroidism; Hyperlipidemia associated with type 2 diabetes mellitus (HCC); Vitamin D deficiency; Medication  management; CKD stage 3 due to type 2 diabetes mellitus (HCC); BMI 24.0-24.9, adult; and Insomnia on their problem list. She is married, has 1 adopted daughter, retired as a Runner, broadcasting/film/video.   She is just back from 2 weeks in [place] head.  She is taking trazodone for insomnia occasionally if needed, takes when traveling.  She has hx of cervical fusion for radiculopathy at C6; she takes tramadol PRN occasionally for pain while on long trips.  BMI is Body mass index is 25.11 kg/m., she has been working on diet and exercise. Wt Readings from Last 3 Encounters:  02/08/18 144 lb (65.3 kg)  10/28/17 143 lb (64.9 kg)  06/28/17 144 lb (65.3 kg)   She has had elevated blood pressure for years. Her blood pressure has been controlled at home, today their BP is BP: 114/66 She does workout. She denies chest pain, shortness of breath, dizziness.    She is on cholesterol medication (atorvastatin 40 mg daily) and denies myalgias. Her cholesterol is at goal. The cholesterol last visit was:   Lab Results  Component Value Date   CHOL 130 10/28/2017   HDL 57 10/28/2017   LDLCALC 53 10/28/2017   TRIG 112 10/28/2017   CHOLHDL 2.3 10/28/2017   She has had diabetes controlled with metformin and glipizide BID. She has been working on diet and exercise for diabetes with CKD, and denies foot ulcerations, hyperglycemia, hypoglycemia , increased appetite, nausea, paresthesia of the feet, polydipsia, polyuria, visual disturbances, vomiting and weight loss. She does check occasional fasting glucose, reports 120-125 typically. Last A1C in the office  was:  Lab Results  Component Value Date   HGBA1C 7.4 (H) 10/28/2017   Lab Results  Component Value Date   GFRNONAA 46 (L) 10/28/2017   Patient is on Vitamin D supplement.   Lab Results  Component Value Date   VD25OH 50 03/29/2017     She is on thyroid medication. Her medication was not changed last visit.   Lab Results  Component Value Date   TSH 1.87 10/28/2017      Medication Review: Current Outpatient Medications on File Prior to Visit  Medication Sig Dispense Refill  . albuterol (PROVENTIL HFA;VENTOLIN HFA) 108 (90 Base) MCG/ACT inhaler Inhale 2 puffs into the lungs every 6 (six) hours as needed for wheezing or shortness of breath (cough). 1 Inhaler 0  . amLODipine (NORVASC) 5 MG tablet Take 1 tablet (5 mg total) by mouth daily. 90 tablet 1  . atorvastatin (LIPITOR) 40 MG tablet Take 1 tablet (40 mg total) by mouth daily. 90 tablet 1  . azelastine (ASTELIN) 0.1 % nasal spray Place 2 sprays into both nostrils 2 (two) times daily. Use in each nostril as directed 30 mL 2  . Biotin 10 MG TABS Take by mouth daily.    . Calcium Carbonate-Vitamin D (CALCIUM-VITAMIN D) 500-200 MG-UNIT per tablet Take 1 tablet by mouth 2 (two) times daily with a meal.    . CINNAMON PO Take 1,000 Units by mouth daily. 2 capsules daily    . fluticasone (FLONASE) 50 MCG/ACT nasal spray Place 2 sprays into both nostrils daily. 16 g 0  . glipiZIDE (GLUCOTROL) 5 MG tablet TAKE 1 TABLET BY MOUTH  TWICE A DAY BEFORE MEALS 180 tablet 1  . levothyroxine (SYNTHROID, LEVOTHROID) 50 MCG tablet TAKE 1 TABLET BY MOUTH  EVERY MORNING ON AN EMPTY  STOMACH WITH ONLY WATER FOR 30 MINUTES 90 tablet 0  . lisinopril-hydrochlorothiazide (PRINZIDE,ZESTORETIC) 20-25 MG tablet TAKE 1 TABLET BY MOUTH  DAILY FOR BLOOD PRESSURE 90 tablet 0  . Magnesium 250 MG TABS Take 250 mg by mouth daily.    . metFORMIN (GLUCOPHAGE-XR) 500 MG 24 hr tablet TAKE 2 TABLETS BY MOUTH  TWICE A DAY WITH MEALS FOR  DIABETES 360 tablet 0  . Omega-3 Fatty Acids (FISH OIL) 1000 MG CAPS Take by mouth daily.    . traMADol (ULTRAM) 50 MG tablet Take 1 tab as needed up to every 6 hours for severe pain. 20 tablet 0  . traZODone (DESYREL) 150 MG tablet Take 1 tablet (150 mg total) by mouth at bedtime. Take 1/2 to 1 tablet at bedtime as needed for sleep 90 tablet 2   No current facility-administered medications on file prior to  visit.     Current Problems (verified) Patient Active Problem List   Diagnosis Date Noted  . Insomnia 10/27/2017  . CKD stage 3 due to type 2 diabetes mellitus (HCC) 06/27/2017  . BMI 24.0-24.9, adult 06/27/2017  . Medication management 03/08/2014  . Hyperlipidemia associated with type 2 diabetes mellitus (HCC) 03/21/2013  . Vitamin D deficiency 03/21/2013  . Type 2 diabetes mellitus (HCC) 03/17/2013  . Hypothyroidism   . Cervical radiculopathy at C6 10/22/2011  . Essential hypertension 07/01/2009    Screening Tests Immunization History  Administered Date(s) Administered  . Influenza Split 02/13/2013  . Influenza, High Dose Seasonal PF 12/07/2014, 12/09/2015, 12/15/2016  . Influenza-Unspecified 03/26/2014  . Pneumococcal Conjugate-13 12/09/2015  . Pneumococcal-Unspecified 03/23/2008  . Tdap 07/17/2010  . Zoster 01/14/2012   Tdap: 2012 Influenza: 2019, TODAY Pneumonia: 2010  Prevnar: 2017 Zoster: 2013  Preventative care: Last colonoscopy: 2006 cologuard 12/2015 by Dr. Loreta AveMann, neg Last mammogram: Jan 2017 q 2 years, DUE patient will schedule DEXA:2013 - ordered last visit, get with mammogram Echo 2011 CXR 2013 Cervival neck 2013  Names of Other Physician/Practitioners you currently use: 1.  [facility] for primary care 2. [facility], last visit 12/21/2016, DUE  3. Dr. Dulce SellarAnnie Reed, dentist, last visit 2019, goes q191m 4. Dr. Marland Kitchen?, Dermatologist, last visit 3-4 months  Patient Care Team: Lucky CowboyMcKeown, William, MD as PCP - General (Internal Medicine) Kathleene HazelMcAlhany, Christopher D, MD as Consulting Physician (Cardiology) Charna ElizabethMann, Jyothi, MD as Consulting Physician (Gastroenterology) Estill Bambergumonski, Mark, MD as Consulting Physician (Orthopedic Surgery) Freddy FinnerNeal, W Ronald, MD as Consulting Physician (Obstetrics and Gynecology)  Allergies No Known Allergies  SURGICAL HISTORY She  has a past surgical history that includes Breast  surgery (80's) and Anterior cervical decomp/discectomy fusion (10/22/2011). FAMILY HISTORY Her family history includes Diabetes in her maternal aunt, maternal grandmother, and maternal uncle; Drug abuse in her maternal grandmother; Heart disease in her mother; Hyperlipidemia in her mother; Hypertension in her mother. SOCIAL HISTORY She  reports that she has never smoked. She has never used smokeless tobacco. She reports that she drinks about 14.0 standard drinks of alcohol per week. She reports that she does not use drugs.   Review of Systems  Constitutional: Negative for chills, fever and malaise/fatigue.  HENT: Negative for congestion, ear pain, hearing loss, nosebleeds, sore throat and tinnitus.   Eyes: Negative for blurred vision and double vision.  Respiratory: Negative for cough, shortness of breath and wheezing.   Cardiovascular: Negative for chest pain, palpitations and leg swelling.  Gastrointestinal: Negative for blood in stool, constipation, diarrhea, heartburn and melena.  Genitourinary: Negative.   Neurological: Negative for dizziness, sensory change, loss of consciousness and headaches.  Psychiatric/Behavioral: Negative for depression. The patient has insomnia. The patient is not nervous/anxious.      Objective:     Today's Vitals   02/08/18 0957  BP: 114/66  Pulse: 70  Temp: 97.7 F (36.5 C)  SpO2: 92%  Weight: 144 lb (65.3 kg)  Height: 5' 3.5" (1.613 m)   Body mass index is 25.11 kg/m.  General appearance: alert, no distress, WD/WN, female HEENT: normocephalic, sclerae anicteric, TMs pearly, nares patent, no discharge or erythema, pharynx normal Oral cavity: MMM, no lesions Neck: supple, no lymphadenopathy, no thyromegaly, no masses Heart: RRR, normal S1, S2, no murmurs Lungs: CTA bilaterally, no wheezes, rhonchi, or rales Abdomen: +bs, soft, non tender, non distended, no masses, no hepatomegaly, no splenomegaly Musculoskeletal: nontender, no swelling, no  obvious deformity Extremities: no edema, no cyanosis, no clubbing Pulses: 2+ symmetric, upper and lower extremities, normal cap refill Neurological: alert, oriented x 3, CN2-12 intact, strength normal upper extremities and lower extremities, sensation normal throughout, DTRs 2+ throughout, no cerebellar signs, gait normal Psychiatric: normal affect, behavior normal, pleasant  Skin: Warm/dry, intact, without notable lesions, ecchymosis, rash Breasts: breasts appear normal, no suspicious masses, no skin or nipple changes or axillary nodes.  EKG: WNL, RBBB, no ST changes   Dan MakerAshley C Willoughby Doell, NP   02/08/2018

## 2018-02-08 ENCOUNTER — Encounter: Payer: Self-pay | Admitting: Adult Health

## 2018-02-08 ENCOUNTER — Ambulatory Visit: Payer: Medicare Other | Admitting: Adult Health

## 2018-02-08 VITALS — BP 114/66 | HR 70 | Temp 97.7°F | Ht 63.5 in | Wt 144.0 lb

## 2018-02-08 DIAGNOSIS — I10 Essential (primary) hypertension: Secondary | ICD-10-CM

## 2018-02-08 DIAGNOSIS — E559 Vitamin D deficiency, unspecified: Secondary | ICD-10-CM

## 2018-02-08 DIAGNOSIS — Z136 Encounter for screening for cardiovascular disorders: Secondary | ICD-10-CM

## 2018-02-08 DIAGNOSIS — Z0001 Encounter for general adult medical examination with abnormal findings: Secondary | ICD-10-CM

## 2018-02-08 DIAGNOSIS — E1169 Type 2 diabetes mellitus with other specified complication: Secondary | ICD-10-CM

## 2018-02-08 DIAGNOSIS — Z23 Encounter for immunization: Secondary | ICD-10-CM

## 2018-02-08 DIAGNOSIS — N183 Chronic kidney disease, stage 3 unspecified: Secondary | ICD-10-CM

## 2018-02-08 DIAGNOSIS — G4700 Insomnia, unspecified: Secondary | ICD-10-CM

## 2018-02-08 DIAGNOSIS — E039 Hypothyroidism, unspecified: Secondary | ICD-10-CM

## 2018-02-08 DIAGNOSIS — I452 Bifascicular block: Secondary | ICD-10-CM

## 2018-02-08 DIAGNOSIS — M5412 Radiculopathy, cervical region: Secondary | ICD-10-CM

## 2018-02-08 DIAGNOSIS — Z79899 Other long term (current) drug therapy: Secondary | ICD-10-CM

## 2018-02-08 DIAGNOSIS — E1122 Type 2 diabetes mellitus with diabetic chronic kidney disease: Secondary | ICD-10-CM

## 2018-02-08 DIAGNOSIS — Z0000 Encounter for general adult medical examination without abnormal findings: Secondary | ICD-10-CM

## 2018-02-08 DIAGNOSIS — Z1211 Encounter for screening for malignant neoplasm of colon: Secondary | ICD-10-CM

## 2018-02-08 DIAGNOSIS — D649 Anemia, unspecified: Secondary | ICD-10-CM

## 2018-02-08 DIAGNOSIS — E785 Hyperlipidemia, unspecified: Secondary | ICD-10-CM

## 2018-02-08 DIAGNOSIS — Z6824 Body mass index (BMI) 24.0-24.9, adult: Secondary | ICD-10-CM

## 2018-02-08 DIAGNOSIS — I4510 Unspecified right bundle-branch block: Secondary | ICD-10-CM | POA: Insufficient documentation

## 2018-02-08 NOTE — Patient Instructions (Addendum)
Please get a diabetic eye exam, schedule mammogram with bone density scan  HOW TO SCHEDULE A MAMMOGRAM  [facility]  7 a.m.-6:30 p.m., Monday 7 a.m.-5 p.m., Tuesday-Friday Schedule an appointment by calling [phone].  [facility] Schedule an appointment by calling [phone].   [name] , Thank you for taking time to come for your Annual Wellness Visit. I appreciate your ongoing commitment to your health goals. Please review the following plan we discussed and let me know if I can assist you in the future.   These are the goals we discussed: Goals   None     This is a list of the screening recommended for you and due dates:  Health Maintenance  Topic Date Due  . Mammogram  04/01/2017  . Flu Shot  10/21/2017  . Eye exam for diabetics  12/21/2017  .  Hepatitis C: One time screening is recommended by Center for Disease Control  (CDC) for  adults born from [birth year] through 1963.   10/29/2018*  . Hemoglobin A1C  04/30/2018  . Cologuard (Stool DNA test)  07/30/2018  . Complete foot exam   10/29/2018  . Tetanus Vaccine  07/16/2020  . DEXA scan (bone density measurement)  Completed  . Pneumonia vaccines  Completed  *Topic was postponed. The date shown is not the original due date.    Know what a healthy weight is for you (roughly BMI <25) and aim to maintain this  Aim for 7+ servings of fruits and vegetables daily  65-80+ fluid ounces of water or unsweet tea for healthy kidneys  Limit to max 1 drink of alcohol per day; avoid smoking/tobacco  Limit animal fats in diet for cholesterol and heart health - choose grass fed whenever available  Avoid highly processed foods, and foods high in saturated/trans fats  Aim for low stress - take time to unwind and care for your mental health  Aim for 150 min of moderate intensity exercise weekly for heart health, and weights twice weekly for bone health  Aim for 7-9 hours of sleep daily

## 2018-02-09 LAB — URINALYSIS W MICROSCOPIC + REFLEX CULTURE
Bacteria, UA: NONE SEEN /HPF
Bilirubin Urine: NEGATIVE
GLUCOSE, UA: NEGATIVE
HGB URINE DIPSTICK: NEGATIVE
HYALINE CAST: NONE SEEN /LPF
Ketones, ur: NEGATIVE
Leukocyte Esterase: NEGATIVE
Nitrites, Initial: NEGATIVE
Protein, ur: NEGATIVE
RBC / HPF: NONE SEEN /HPF (ref 0–2)
SPECIFIC GRAVITY, URINE: 1.01 (ref 1.001–1.03)
Squamous Epithelial / LPF: NONE SEEN /HPF (ref <=5)
WBC UA: NONE SEEN /HPF (ref 0–5)

## 2018-02-09 LAB — COMPLETE METABOLIC PANEL WITH GFR
AG RATIO: 1.9 (calc) (ref 1.0–2.5)
ALBUMIN MSPROF: 4.2 g/dL (ref 3.6–5.1)
ALT: 14 U/L (ref 6–29)
AST: 16 U/L (ref 10–35)
Alkaline phosphatase (APISO): 75 U/L (ref 33–130)
BUN / CREAT RATIO: 19 (calc) (ref 6–22)
BUN: 20 mg/dL (ref 7–25)
CALCIUM: 9.3 mg/dL (ref 8.6–10.4)
CO2: 29 mmol/L (ref 20–32)
CREATININE: 1.07 mg/dL — AB (ref 0.60–0.93)
Chloride: 101 mmol/L (ref 98–110)
GFR, Est African American: 61 mL/min/{1.73_m2} (ref >=60)
GFR, Est Non African American: 53 mL/min/{1.73_m2} — ABNORMAL LOW (ref >=60)
GLOBULIN: 2.2 g/dL (ref 1.9–3.7)
Glucose, Bld: 134 mg/dL — ABNORMAL HIGH (ref 65–99)
POTASSIUM: 4.6 mmol/L (ref 3.5–5.3)
SODIUM: 136 mmol/L (ref 135–146)
Total Bilirubin: 0.4 mg/dL (ref 0.2–1.2)
Total Protein: 6.4 g/dL (ref 6.1–8.1)

## 2018-02-09 LAB — CBC WITH DIFFERENTIAL/PLATELET
BASOS ABS: 62 {cells}/uL (ref 0–200)
Basophils Relative: 0.7 %
Eosinophils Absolute: 123 cells/uL (ref 15–500)
Eosinophils Relative: 1.4 %
HEMATOCRIT: 41.2 % (ref 35.0–45.0)
HEMOGLOBIN: 13.9 g/dL (ref 11.7–15.5)
LYMPHS ABS: 1778 {cells}/uL (ref 850–3900)
MCH: 28.5 pg (ref 27.0–33.0)
MCHC: 33.7 g/dL (ref 32.0–36.0)
MCV: 84.6 fL (ref 80.0–100.0)
MPV: 10.3 fL (ref 7.5–12.5)
Monocytes Relative: 5.6 %
NEUTROS ABS: 6345 {cells}/uL (ref 1500–7800)
Neutrophils Relative %: 72.1 %
Platelets: 373 10*3/uL (ref 140–400)
RBC: 4.87 10*6/uL (ref 3.80–5.10)
RDW: 12.7 % (ref 11.0–15.0)
Total Lymphocyte: 20.2 %
WBC: 8.8 10*3/uL (ref 3.8–10.8)
WBCMIX: 493 {cells}/uL (ref 200–950)

## 2018-02-09 LAB — LIPID PANEL
CHOLESTEROL: 129 mg/dL (ref <200)
HDL: 48 mg/dL — ABNORMAL LOW (ref >50)
LDL CHOLESTEROL (CALC): 60 mg/dL
Non-HDL Cholesterol (Calc): 81 mg/dL (calc) (ref <130)
Total CHOL/HDL Ratio: 2.7 (calc) (ref <5.0)
Triglycerides: 127 mg/dL (ref <150)

## 2018-02-09 LAB — MICROALBUMIN / CREATININE URINE RATIO: Creatinine, Urine: 48 mg/dL (ref 20–275)

## 2018-02-09 LAB — NO CULTURE INDICATED

## 2018-02-09 LAB — MAGNESIUM: Magnesium: 2 mg/dL (ref 1.5–2.5)

## 2018-02-09 LAB — IRON, TOTAL/TOTAL IRON BINDING CAP
%SAT: 25 % (ref 16–45)
IRON: 86 ug/dL (ref 45–160)
TIBC: 349 ug/dL (ref 250–450)

## 2018-02-09 LAB — TSH: TSH: 3.17 m[IU]/L (ref 0.40–4.50)

## 2018-02-09 LAB — VITAMIN D 25 HYDROXY (VIT D DEFICIENCY, FRACTURES): Vit D, 25-Hydroxy: 45 ng/mL (ref 30–100)

## 2018-02-09 LAB — HEMOGLOBIN A1C
Hgb A1c MFr Bld: 7.9 % of total Hgb — ABNORMAL HIGH (ref <5.7)
MEAN PLASMA GLUCOSE: 180 (calc)
eAG (mmol/L): 10 (calc)

## 2018-02-09 LAB — VITAMIN B12: VITAMIN B 12: 343 pg/mL (ref 200–1100)

## 2018-02-17 ENCOUNTER — Other Ambulatory Visit: Payer: Self-pay | Admitting: Internal Medicine

## 2018-02-17 DIAGNOSIS — I10 Essential (primary) hypertension: Secondary | ICD-10-CM

## 2018-04-13 ENCOUNTER — Other Ambulatory Visit: Payer: Self-pay | Admitting: Internal Medicine

## 2018-04-13 DIAGNOSIS — E1122 Type 2 diabetes mellitus with diabetic chronic kidney disease: Secondary | ICD-10-CM

## 2018-04-13 DIAGNOSIS — N183 Chronic kidney disease, stage 3 (moderate): Secondary | ICD-10-CM

## 2018-04-13 DIAGNOSIS — E039 Hypothyroidism, unspecified: Secondary | ICD-10-CM

## 2018-04-13 DIAGNOSIS — I10 Essential (primary) hypertension: Secondary | ICD-10-CM

## 2018-04-14 ENCOUNTER — Other Ambulatory Visit: Payer: Self-pay | Admitting: Adult Health

## 2018-04-14 DIAGNOSIS — E782 Mixed hyperlipidemia: Secondary | ICD-10-CM

## 2018-05-12 LAB — HM MAMMOGRAPHY

## 2018-05-24 ENCOUNTER — Encounter: Payer: Self-pay | Admitting: Internal Medicine

## 2018-05-25 ENCOUNTER — Encounter: Payer: Self-pay | Admitting: Internal Medicine

## 2018-05-25 ENCOUNTER — Ambulatory Visit: Payer: Medicare Other | Admitting: Internal Medicine

## 2018-05-25 VITALS — BP 142/80 | HR 68 | Temp 97.8°F | Resp 16 | Ht 63.5 in | Wt 146.4 lb

## 2018-05-25 DIAGNOSIS — E1122 Type 2 diabetes mellitus with diabetic chronic kidney disease: Secondary | ICD-10-CM | POA: Diagnosis not present

## 2018-05-25 DIAGNOSIS — I10 Essential (primary) hypertension: Secondary | ICD-10-CM | POA: Diagnosis not present

## 2018-05-25 DIAGNOSIS — Z79899 Other long term (current) drug therapy: Secondary | ICD-10-CM

## 2018-05-25 DIAGNOSIS — E039 Hypothyroidism, unspecified: Secondary | ICD-10-CM

## 2018-05-25 DIAGNOSIS — N183 Chronic kidney disease, stage 3 unspecified: Secondary | ICD-10-CM

## 2018-05-25 DIAGNOSIS — E559 Vitamin D deficiency, unspecified: Secondary | ICD-10-CM | POA: Diagnosis not present

## 2018-05-25 DIAGNOSIS — E782 Mixed hyperlipidemia: Secondary | ICD-10-CM

## 2018-05-25 NOTE — Progress Notes (Signed)
This very nice 73 y.o.female presents for 3 month follow up with HTN, HLD, NIDDM and Vitamin D Deficiency.      Patient is treated for HTN (2007)  & BP has been controlled at home. Today's  . Patient has had no complaints of any cardiac type chest pain, palpitations, dyspnea / orthopnea / PND, dizziness, claudication, or dependent edema.     Hyperlipidemia is controlled with diet & meds. Patient denies myalgias or other med SE's. Last Lipids were  Lab Results  Component Value Date   CHOL 129 02/08/2018   HDL 48 (L) 02/08/2018   LDLCALC 60 02/08/2018   TRIG 127 02/08/2018   CHOLHDL 2.7 02/08/2018      Also, the patient has history of T2_NIDDM (2006)/CKD3 and has had no symptoms of reactive hypoglycemia, diabetic polys, paresthesias or visual blurring.  She does NOT monitor CBG's and  last A1c was not at goal: Lab Results  Component Value Date   HGBA1C 7.9 (H) 02/08/2018      Further, the patient also has history of Vitamin D Deficiency and supplements vitamin D without any suspected side-effects. Last vitamin D was   Lab Results  Component Value Date   VD25OH 45 02/08/2018   Current Outpatient Medications on File Prior to Visit  Medication Sig  . albuterol (PROVENTIL HFA;VENTOLIN HFA) 108 (90 Base) MCG/ACT inhaler Inhale 2 puffs into the lungs every 6 (six) hours as needed for wheezing or shortness of breath (cough).  Marland Kitchen amLODipine (NORVASC) 5 MG tablet TAKE 1 TABLET BY MOUTH  DAILY  . atorvastatin (LIPITOR) 40 MG tablet TAKE 1 TABLET BY MOUTH EVERY DAY  . azelastine (ASTELIN) 0.1 % nasal spray Place 2 sprays into both nostrils 2 (two) times daily. Use in each nostril as directed  . Biotin 10 MG TABS Take by mouth daily.  . Calcium Carbonate-Vitamin D (CALCIUM-VITAMIN D) 500-200 MG-UNIT per tablet Take 1 tablet by mouth 2 (two) times daily with a meal.  . CINNAMON PO Take 1,000 Units by mouth daily. 2 capsules daily  . fluticasone (FLONASE) 50 MCG/ACT nasal spray Place 2 sprays  into both nostrils daily.  Marland Kitchen glipiZIDE (GLUCOTROL) 5 MG tablet TAKE 1 TABLET BY MOUTH  TWICE A DAY BEFORE MEALS  . levothyroxine (SYNTHROID, LEVOTHROID) 50 MCG tablet TAKE 1 TABLET BY MOUTH  EVERY MORNING ON AN EMPTY  STOMACH WITH ONLY WATER FOR 30 MINUTES  . lisinopril-hydrochlorothiazide (PRINZIDE,ZESTORETIC) 20-25 MG tablet TAKE 1 TABLET BY MOUTH  DAILY FOR BLOOD PRESSURE  . Magnesium 250 MG TABS Take 250 mg by mouth daily.  . metFORMIN (GLUCOPHAGE-XR) 500 MG 24 hr tablet TAKE 2 TABLETS BY MOUTH  TWICE A DAY WITH MEALS FOR  DIABETES  . Omega-3 Fatty Acids (FISH OIL) 1000 MG CAPS Take by mouth daily.  . traMADol (ULTRAM) 50 MG tablet Take 1 tab as needed up to every 6 hours for severe pain.  . traZODone (DESYREL) 150 MG tablet Take 1 tablet (150 mg total) by mouth at bedtime. Take 1/2 to 1 tablet at bedtime as needed for sleep   No current facility-administered medications on file prior to visit.    No Known Allergies   PMHx:   Past Medical History:  Diagnosis Date  . Diabetes mellitus   . Headache(784.0)    hx migraines  . Hyperlipidemia   . Hypertension   . Hypothyroidism    Immunization History  Administered Date(s) Administered  . Influenza Split 02/13/2013  . Influenza, High  Dose Seasonal PF 12/07/2014, 12/09/2015, 12/15/2016, 02/08/2018  . Influenza-Unspecified 03/26/2014  . Pneumococcal Conjugate-13 12/09/2015  . Pneumococcal-Unspecified 03/23/2008  . Tdap 07/17/2010  . Zoster 01/14/2012   Past Surgical History:  Procedure Laterality Date  . ANTERIOR CERVICAL DECOMP/DISCECTOMY FUSION  10/22/2011   Procedure: ANTERIOR CERVICAL DECOMPRESSION/DISCECTOMY FUSION 1 LEVEL;  Surgeon: Emilee Hero, MD;  Location: [facility] OR;  Service: Orthopedics;  Laterality: Right;  Anterior cervical decompression fusion, cervical 5-6 with instrumentation and allograft.  Marland Kitchen BREAST SURGERY  80's   lft cyst   FHx:    Reviewed / unchanged  SHx:    Reviewed / unchanged   Systems  Review:  Constitutional: Denies fever, chills, wt changes, headaches, insomnia, fatigue, night sweats, change in appetite. Eyes: Denies redness, blurred vision, diplopia, discharge, itchy, watery eyes.  ENT: Denies discharge, congestion, post nasal drip, epistaxis, sore throat, earache, hearing loss, dental pain, tinnitus, vertigo, sinus pain, snoring.  CV: Denies chest pain, palpitations, irregular heartbeat, syncope, dyspnea, diaphoresis, orthopnea, PND, claudication or edema. Respiratory: denies cough, dyspnea, DOE, pleurisy, hoarseness, laryngitis, wheezing.  Gastrointestinal: Denies dysphagia, odynophagia, heartburn, reflux, water brash, abdominal pain or cramps, nausea, vomiting, bloating, diarrhea, constipation, hematemesis, melena, hematochezia  or hemorrhoids. Genitourinary: Denies dysuria, frequency, urgency, nocturia, hesitancy, discharge, hematuria or flank pain. Musculoskeletal: Denies arthralgias, myalgias, stiffness, jt. swelling, pain, limping or strain/sprain.  Skin: Denies pruritus, rash, hives, warts, acne, eczema or change in skin lesion(s). Neuro: No weakness, tremor, incoordination, spasms, paresthesia or pain. Psychiatric: Denies confusion, memory loss or sensory loss. Endo: Denies change in weight, skin or hair change.  Heme/Lymph: No excessive bleeding, bruising or enlarged lymph nodes.  Physical Exam  There were no vitals taken for this visit.  Appears  well nourished, well groomed  and in no distress.  Eyes: PERRLA, EOMs, conjunctiva no swelling or erythema. Sinuses: No frontal/maxillary tenderness ENT/Mouth: EAC's clear, TM's nl w/o erythema, bulging. Nares clear w/o erythema, swelling, exudates. Oropharynx clear without erythema or exudates. Oral hygiene is good. Tongue normal, non obstructing. Hearing intact.  Neck: Supple. Thyroid not palpable. Car 2+/2+ without bruits, nodes or JVD. Chest: Respirations nl with BS clear & equal w/o rales, rhonchi, wheezing or  stridor.  Cor: Heart sounds normal w/ regular rate and rhythm without sig. murmurs, gallops, clicks or rubs. Peripheral pulses normal and equal  without edema.  Abdomen: Soft & bowel sounds normal. Non-tender w/o guarding, rebound, hernias, masses or organomegaly.  Lymphatics: Unremarkable.  Musculoskeletal: Full ROM all peripheral extremities, joint stability, 5/5 strength and normal gait.  Skin: Warm, dry without exposed rashes, lesions or ecchymosis apparent.  Neuro: Cranial nerves intact, reflexes equal bilaterally. Sensory-motor testing grossly intact. Tendon reflexes grossly intact.  Pysch: Alert & oriented x 3.  Insight and judgement nl & appropriate. No ideations.  Assessment and Plan:  - Continue medication, monitor blood pressure at home.  - Continue DASH diet.  Reminder to go to the ER if any CP,  SOB, nausea, dizziness, severe HA, changes vision/speech.  1. Essential hypertension  - CBC with Differential/Platelet - COMPLETE METABOLIC PANEL WITH GFR - Magnesium - TSH  2. Hyperlipidemia, mixed  - Continue diet/meds, exercise,& lifestyle modifications.  - Continue monitor periodic cholesterol/liver & renal functions   - Lipid panel - TSH  3. Type 2 diabetes mellitus with stage 3 chronic kidney disease, without long-term current use of insulin (HCC)  - Continue diet, exercise  - Lifestyle modifications.  - Monitor appropriate labs.  - Hemoglobin A1c - Insulin, random  4.  Vitamin D deficiency  - Continue supplementation.  - VITAMIN D 25 Hydroxy  5. Hypothyroidism  - TSH  6. Medication management  - CBC with Differential/Platelet - COMPLETE METABOLIC PANEL WITH GFR - Magnesium - Lipid panel - TSH - Hemoglobin A1c - Insulin, random - VITAMIN D 25 Hydroxyl       Discussed  regular exercise, BP monitoring, weight control to achieve/maintain BMI less than 25 and discussed med and SE's. Recommended labs to assess and monitor clinical status with further  disposition pending results of labs. Over 30 minutes of exam, counseling, chart review was performed.

## 2018-05-25 NOTE — Patient Instructions (Signed)

## 2018-05-26 LAB — LIPID PANEL
Cholesterol: 124 mg/dL (ref <200)
HDL: 47 mg/dL — ABNORMAL LOW (ref >=50)
LDL CHOLESTEROL (CALC): 55 mg/dL
Non-HDL Cholesterol (Calc): 77 mg/dL (calc) (ref <130)
Total CHOL/HDL Ratio: 2.6 (calc) (ref <5.0)
Triglycerides: 141 mg/dL (ref <150)

## 2018-05-26 LAB — COMPLETE METABOLIC PANEL WITH GFR
AG Ratio: 1.8 (calc) (ref 1.0–2.5)
ALT: 18 U/L (ref 6–29)
AST: 19 U/L (ref 10–35)
Albumin: 4.4 g/dL (ref 3.6–5.1)
Alkaline phosphatase (APISO): 86 U/L (ref 37–153)
BUN/Creatinine Ratio: 22 (calc) (ref 6–22)
BUN: 25 mg/dL (ref 7–25)
CALCIUM: 9.6 mg/dL (ref 8.6–10.4)
CO2: 25 mmol/L (ref 20–32)
CREATININE: 1.12 mg/dL — AB (ref 0.60–0.93)
Chloride: 100 mmol/L (ref 98–110)
GFR, EST AFRICAN AMERICAN: 57 mL/min/{1.73_m2} — AB (ref >=60)
GFR, Est Non African American: 49 mL/min/{1.73_m2} — ABNORMAL LOW (ref >=60)
Globulin: 2.4 g/dL (calc) (ref 1.9–3.7)
Glucose, Bld: 162 mg/dL — ABNORMAL HIGH (ref 65–99)
Potassium: 4.1 mmol/L (ref 3.5–5.3)
Sodium: 136 mmol/L (ref 135–146)
TOTAL PROTEIN: 6.8 g/dL (ref 6.1–8.1)
Total Bilirubin: 0.6 mg/dL (ref 0.2–1.2)

## 2018-05-26 LAB — CBC WITH DIFFERENTIAL/PLATELET
Absolute Monocytes: 501 cells/uL (ref 200–950)
Basophils Absolute: 91 cells/uL (ref 0–200)
Basophils Relative: 1 %
Eosinophils Absolute: 255 cells/uL (ref 15–500)
Eosinophils Relative: 2.8 %
HCT: 43.4 % (ref 35.0–45.0)
Hemoglobin: 14.5 g/dL (ref 11.7–15.5)
Lymphs Abs: 2430 cells/uL (ref 850–3900)
MCH: 28.6 pg (ref 27.0–33.0)
MCHC: 33.4 g/dL (ref 32.0–36.0)
MCV: 85.6 fL (ref 80.0–100.0)
MPV: 10.6 fL (ref 7.5–12.5)
Monocytes Relative: 5.5 %
Neutro Abs: 5824 cells/uL (ref 1500–7800)
Neutrophils Relative %: 64 %
Platelets: 389 10*3/uL (ref 140–400)
RBC: 5.07 10*6/uL (ref 3.80–5.10)
RDW: 12.6 % (ref 11.0–15.0)
Total Lymphocyte: 26.7 %
WBC: 9.1 10*3/uL (ref 3.8–10.8)

## 2018-05-26 LAB — MAGNESIUM: MAGNESIUM: 1.9 mg/dL (ref 1.5–2.5)

## 2018-05-26 LAB — HEMOGLOBIN A1C
HEMOGLOBIN A1C: 8.7 %{Hb} — AB (ref <5.7)
Mean Plasma Glucose: 203 (calc)
eAG (mmol/L): 11.2 (calc)

## 2018-05-26 LAB — TSH: TSH: 2.41 mIU/L (ref 0.40–4.50)

## 2018-05-26 LAB — INSULIN, RANDOM: Insulin: 1.4 u[IU]/mL

## 2018-05-26 LAB — VITAMIN D 25 HYDROXY (VIT D DEFICIENCY, FRACTURES): Vit D, 25-Hydroxy: 49 ng/mL (ref 30–100)

## 2018-05-31 ENCOUNTER — Encounter: Payer: Self-pay | Admitting: *Deleted

## 2018-06-09 ENCOUNTER — Other Ambulatory Visit: Payer: Self-pay | Admitting: *Deleted

## 2018-06-09 MED ORDER — GLIPIZIDE 5 MG PO TABS: ORAL_TABLET | ORAL | 1 refills | Status: DC

## 2018-06-27 ENCOUNTER — Other Ambulatory Visit: Payer: Self-pay | Admitting: *Deleted

## 2018-06-27 DIAGNOSIS — E039 Hypothyroidism, unspecified: Secondary | ICD-10-CM

## 2018-06-27 DIAGNOSIS — I10 Essential (primary) hypertension: Secondary | ICD-10-CM

## 2018-06-27 DIAGNOSIS — N183 Chronic kidney disease, stage 3 (moderate): Secondary | ICD-10-CM

## 2018-06-27 DIAGNOSIS — E1122 Type 2 diabetes mellitus with diabetic chronic kidney disease: Secondary | ICD-10-CM

## 2018-06-27 DIAGNOSIS — E782 Mixed hyperlipidemia: Secondary | ICD-10-CM

## 2018-06-27 MED ORDER — AMLODIPINE BESYLATE 5 MG PO TABS: 5.0000 mg | ORAL_TABLET | Freq: Every day | ORAL | 1 refills | Status: DC

## 2018-06-27 MED ORDER — LEVOTHYROXINE SODIUM 50 MCG PO TABS: ORAL_TABLET | ORAL | 1 refills | Status: DC

## 2018-06-27 MED ORDER — METFORMIN HCL ER 500 MG PO TB24: ORAL_TABLET | ORAL | 1 refills | Status: DC

## 2018-06-27 MED ORDER — ATORVASTATIN CALCIUM 40 MG PO TABS: 40.0000 mg | ORAL_TABLET | Freq: Every day | ORAL | 1 refills | Status: DC

## 2018-06-27 MED ORDER — GLIPIZIDE 5 MG PO TABS: ORAL_TABLET | ORAL | 1 refills | Status: DC

## 2018-07-07 ENCOUNTER — Other Ambulatory Visit: Payer: Self-pay | Admitting: *Deleted

## 2018-07-07 ENCOUNTER — Telehealth: Payer: Self-pay | Admitting: *Deleted

## 2018-07-07 ENCOUNTER — Other Ambulatory Visit: Payer: Self-pay | Admitting: Internal Medicine

## 2018-07-07 DIAGNOSIS — I10 Essential (primary) hypertension: Secondary | ICD-10-CM

## 2018-07-07 MED ORDER — GLIPIZIDE 10 MG PO TABS: ORAL_TABLET | ORAL | 1 refills | Status: DC

## 2018-07-07 NOTE — Telephone Encounter (Signed)
Patient requested a refill of Glipizide 5 mg 2 tablet 3 times a day with meals. Per her last lab instructions, Dr Oneta Rack informed the patient he will send in 10 mg tablets for 1 tablet 3 times a day with meals. A message was left to inform the patient the higher dose has been sent to [facility].

## 2018-08-30 NOTE — Progress Notes (Deleted)
FOLLOW UP  Assessment and Plan:   Hypertension Well controlled with current medications  Monitor blood pressure at home; patient to call if consistently greater than 130/80 Continue DASH diet.   Reminder to go to the ER if any CP, SOB, nausea, dizziness, severe HA, changes vision/speech, left arm numbness and tingling and jaw pain.  Cholesterol Currently at goal of LDL <70; continue atorvastatin  Continue low cholesterol diet and exercise.  Check lipid panel.   Diabetes with diabetic chronic kidney disease Continue medication: metformin, glipizide Continue diet and exercise.  Perform daily foot/skin check, notify office of any concerning changes.  Check A1C  BMI 25  Long discussion about weight loss, diet, and exercise Recommended diet heavy in fruits and veggies and low in animal meats, cheeses, and dairy products, appropriate calorie intake Discussed ideal weight for height  Will follow up in 3 months  Hypothyroidism continue medications the same pending lab results reminded to take on an empty stomach 30-4660mins before food.  check TSH level  Vitamin D Def Near goal at last visit; *** continue supplementation to maintain goal of 60-100  *** Vit D level   Continue diet and meds as discussed. Further disposition pending results of labs. Discussed med's effects and SE's.   Over 30 minutes of exam, counseling, chart review, and critical decision making was performed.   Future Appointments  Date Time Provider Department Center  08/31/2018  9:30 AM Judd Gaudierorbett, Lesleyann Fichter, NP [facility] None  12/06/2018 10:00 AM Judd Gaudierorbett, Byrd Terrero, NP [facility] None  03/28/2019 10:00 AM Judd Gaudierorbett, Nina Mondor, NP [facility] None    ----------------------------------------------------------------------------------------------------------------------  HPI 73 y.o. female  presents for 3 month follow up on hypertension, cholesterol, diabetes, weight and vitamin D deficiency.   She has hx of upper back pain  with cervical radiculopathy at C6 which required surgical intervention (anterior cervical decompression/fusion in 2013); her pain is typically well managed by PRN tylenol, but reports she struggles on long car trips and air travel. She has been prescribed tramadol remotely and uses this very rarely to help manage this pain with travel.   She is prescribed trazodone for insomnia ***  BMI is There is no height or weight on file to calculate BMI., she has been working on diet and exercise. Wt Readings from Last 3 Encounters:  05/25/18 146 lb 6.4 oz (66.4 kg)  02/08/18 144 lb (65.3 kg)  10/28/17 143 lb (64.9 kg)   Her blood pressure has been controlled at home, today their BP is    She does workout. She denies chest pain, shortness of breath, dizziness.   She is on cholesterol medication (atorvastatin 40 mg daily) and denies myalgias. Her cholesterol is at goal. The cholesterol last visit was:   Lab Results  Component Value Date   CHOL 124 05/25/2018   HDL 47 (L) 05/25/2018   LDLCALC 55 05/25/2018   TRIG 141 05/25/2018   CHOLHDL 2.6 05/25/2018    She has been working on diet and exercise for T2 diabetes (metformin ***, glipizide 10 mg TID ***), and denies foot ulcerations, increased appetite, nausea, paresthesia of the feet, polydipsia, polyuria, visual disturbances, vomiting and weight loss. She does check an occasional fasting which has ranged 110-158 these past months, which is consistent with previous. Last A1C in the office was:  Lab Results  Component Value Date   HGBA1C 8.7 (H) 05/25/2018   She is on thyroid medication. Her medication was not changed last visit.   Lab Results  Component Value Date  TSH 2.41 05/25/2018   Patient is on Vitamin D supplement but remained below goal of 70 at last check:    Lab Results  Component Value Date   VD25OH 49 05/25/2018     Vitamin D dose was not changed.    Current Medications:  Current Outpatient Medications on File Prior to Visit   Medication Sig  . albuterol (PROVENTIL HFA;VENTOLIN HFA) 108 (90 Base) MCG/ACT inhaler Inhale 2 puffs into the lungs every 6 (six) hours as needed for wheezing or shortness of breath (cough).  Marland Kitchen amLODipine (NORVASC) 5 MG tablet Take 1 tablet (5 mg total) by mouth daily.  Marland Kitchen atorvastatin (LIPITOR) 40 MG tablet Take 1 tablet (40 mg total) by mouth daily.  Marland Kitchen azelastine (ASTELIN) 0.1 % nasal spray Place 2 sprays into both nostrils 2 (two) times daily. Use in each nostril as directed  . Biotin 10 MG TABS Take by mouth daily.  . Calcium Carbonate-Vitamin D (CALCIUM-VITAMIN D) 500-200 MG-UNIT per tablet Take 1 tablet by mouth 2 (two) times daily with a meal.  . CINNAMON PO Take 1,000 Units by mouth daily. 2 capsules daily  . fluticasone (FLONASE) 50 MCG/ACT nasal spray Place 2 sprays into both nostrils daily.  Marland Kitchen glipiZIDE (GLUCOTROL) 10 MG tablet Takes 1 tablet 3 times a day with meals.  Marland Kitchen levothyroxine (SYNTHROID, LEVOTHROID) 50 MCG tablet Patient request [employer] manufacturer.  Marland Kitchen lisinopril-hydrochlorothiazide (ZESTORETIC) 20-25 MG tablet Take 1 tablet Daily for BP  . Magnesium 250 MG TABS Take 250 mg by mouth daily.  . metFORMIN (GLUCOPHAGE-XR) 500 MG 24 hr tablet TAKE 2 TABLETS BY MOUTH  TWICE A DAY WITH MEALS FOR  DIABETES  . Omega-3 Fatty Acids (FISH OIL) 1000 MG CAPS Take by mouth daily.  . traMADol (ULTRAM) 50 MG tablet Take 1 tab as needed up to every 6 hours for severe pain.  . traZODone (DESYREL) 150 MG tablet Take 1 tablet (150 mg total) by mouth at bedtime. Take 1/2 to 1 tablet at bedtime as needed for sleep   No current facility-administered medications on file prior to visit.      Allergies: No Known Allergies   Medical History:  Past Medical History:  Diagnosis Date  . Diabetes mellitus   . Headache(784.0)    hx migraines  . Hyperlipidemia   . Hypertension   . Hypothyroidism    Family history- Reviewed and unchanged Social history- Reviewed and unchanged   Review of  Systems:  Review of Systems  Constitutional: Negative for malaise/fatigue and weight loss.  HENT: Negative for hearing loss and tinnitus.   Eyes: Negative for blurred vision and double vision.  Respiratory: Negative for cough, shortness of breath and wheezing.   Cardiovascular: Negative for chest pain, palpitations, orthopnea, claudication and leg swelling.  Gastrointestinal: Negative for abdominal pain, blood in stool, constipation, diarrhea, heartburn, melena, nausea and vomiting.  Genitourinary: Negative.   Musculoskeletal: Negative for joint pain and myalgias.  Skin: Negative for rash.  Neurological: Negative for dizziness, tingling, sensory change, weakness and headaches.  Endo/Heme/Allergies: Negative for polydipsia.  Psychiatric/Behavioral: Negative for suicidal ideas. The patient has insomnia. The patient is not nervous/anxious.   All other systems reviewed and are negative.    Physical Exam: There were no vitals taken for this visit. Wt Readings from Last 3 Encounters:  05/25/18 146 lb 6.4 oz (66.4 kg)  02/08/18 144 lb (65.3 kg)  10/28/17 143 lb (64.9 kg)   General Appearance: Well nourished, in no apparent distress. Eyes: PERRLA, EOMs, conjunctiva  no swelling or erythema Sinuses: No Frontal/maxillary tenderness ENT/Mouth: Ext aud canals clear, TMs without erythema, bulging. No erythema, swelling, or exudate on post pharynx.  Tonsils not swollen or erythematous. Hearing normal.  Neck: Supple, thyroid normal.  Respiratory: Respiratory effort normal, BS equal bilaterally without rales, rhonchi, wheezing or stridor.  Cardio: RRR with no MRGs. Brisk peripheral pulses without edema.  Abdomen: Soft, + BS.  Non tender, no guarding, rebound, hernias, masses. Lymphatics: Non tender without lymphadenopathy.  Musculoskeletal: Full ROM, 5/5 strength, Normal gait Skin: Warm, dry without rashes, lesions, ecchymosis.  Neuro: Cranial nerves intact. No cerebellar symptoms.  Psych: Awake  and oriented X 3, normal affect, Insight and Judgment appropriate.    Dan MakerAshley C Vernia Teem, NP 1:00 PM [facility]

## 2018-08-31 ENCOUNTER — Ambulatory Visit: Payer: Medicare Other | Admitting: Adult Health

## 2018-11-01 ENCOUNTER — Other Ambulatory Visit: Payer: Self-pay | Admitting: Internal Medicine

## 2018-11-01 DIAGNOSIS — E782 Mixed hyperlipidemia: Secondary | ICD-10-CM

## 2018-11-04 ENCOUNTER — Ambulatory Visit: Payer: Self-pay | Admitting: Adult Health

## 2018-11-24 ENCOUNTER — Other Ambulatory Visit: Payer: Self-pay | Admitting: Internal Medicine

## 2018-12-06 ENCOUNTER — Ambulatory Visit: Payer: Self-pay | Admitting: Adult Health

## 2018-12-14 ENCOUNTER — Other Ambulatory Visit: Payer: Self-pay

## 2018-12-14 ENCOUNTER — Ambulatory Visit (INDEPENDENT_AMBULATORY_CARE_PROVIDER_SITE_OTHER): Payer: Medicare Other | Admitting: Adult Health

## 2018-12-14 ENCOUNTER — Encounter: Payer: Self-pay | Admitting: Adult Health

## 2018-12-14 VITALS — BP 114/74 | HR 68 | Temp 97.7°F | Ht 63.5 in | Wt 134.0 lb

## 2018-12-14 DIAGNOSIS — N183 Chronic kidney disease, stage 3 unspecified: Secondary | ICD-10-CM

## 2018-12-14 DIAGNOSIS — Z6824 Body mass index (BMI) 24.0-24.9, adult: Secondary | ICD-10-CM

## 2018-12-14 DIAGNOSIS — Z0000 Encounter for general adult medical examination without abnormal findings: Secondary | ICD-10-CM | POA: Diagnosis not present

## 2018-12-14 DIAGNOSIS — E039 Hypothyroidism, unspecified: Secondary | ICD-10-CM

## 2018-12-14 DIAGNOSIS — E1169 Type 2 diabetes mellitus with other specified complication: Secondary | ICD-10-CM | POA: Diagnosis not present

## 2018-12-14 DIAGNOSIS — E785 Hyperlipidemia, unspecified: Secondary | ICD-10-CM

## 2018-12-14 DIAGNOSIS — I452 Bifascicular block: Secondary | ICD-10-CM

## 2018-12-14 DIAGNOSIS — Z1159 Encounter for screening for other viral diseases: Secondary | ICD-10-CM

## 2018-12-14 DIAGNOSIS — R6889 Other general symptoms and signs: Secondary | ICD-10-CM | POA: Diagnosis not present

## 2018-12-14 DIAGNOSIS — E559 Vitamin D deficiency, unspecified: Secondary | ICD-10-CM

## 2018-12-14 DIAGNOSIS — E1122 Type 2 diabetes mellitus with diabetic chronic kidney disease: Secondary | ICD-10-CM

## 2018-12-14 DIAGNOSIS — M5412 Radiculopathy, cervical region: Secondary | ICD-10-CM

## 2018-12-14 DIAGNOSIS — E2839 Other primary ovarian failure: Secondary | ICD-10-CM

## 2018-12-14 DIAGNOSIS — Z79899 Other long term (current) drug therapy: Secondary | ICD-10-CM

## 2018-12-14 DIAGNOSIS — G4700 Insomnia, unspecified: Secondary | ICD-10-CM

## 2018-12-14 DIAGNOSIS — I10 Essential (primary) hypertension: Secondary | ICD-10-CM

## 2018-12-14 MED ORDER — DOXYCYCLINE HYCLATE 100 MG PO TABS: 100.0000 mg | ORAL_TABLET | Freq: Two times a day (BID) | ORAL | 1 refills | Status: AC

## 2018-12-14 NOTE — Progress Notes (Signed)
Patient ID: Elizabeth May, female   DOB: July 06, 1945, 73 y.o.   MRN: [id number]   MEDICARE ANNUAL WELLNESS VISIT AND 3 MONTH FOLLOW UP  Assessment:   Encounter for Medicare annual wellness exam   Essential hypertension - continue medications, DASH diet, exercise and monitor at home. Call if greater than 130/80.  -     CBC with Differential/Platelet -     CMP/GFR -     TSH  Type 2 diabetes mellitus with stage 2 chronic kidney disease, without long-term current use of insulin (HCC) A1C goal of <7.0% discussed ; consider adding SGLT 2 pending labs Encouraged to check glucose daily;  Discussed general issues about diabetes pathophysiology and management., Educational material distributed., Suggested low cholesterol diet., Encouraged aerobic exercise., Discussed foot care., Reminded to get yearly retinal exam. -     Hemoglobin A1c  Hypothyroidism, unspecified type Hypothyroidism-check TSH level, continue medications the same, reminded to take on an empty stomach 30-18mins before food.  -     TSH  Cervical radiculopathy at C6 s/p fusion, takes tramadol PRN, mainly for travel  Mixed hyperlipidemia -continue medications, check lipids, decrease fatty foods, increase activity.  -     Lipid panel  Vitamin D deficiency At goal at recent check;  Defer vitamin D level  Insomnia Well managed by trazodone good sleep hygiene discussed, increase day time activity  Medication management -     Magnesium   Over 40 minutes of exam, counseling, chart review and critical decision making was performed Future Appointments  Date Time Provider [place]  03/28/2019 10:00 AM Liane Comber, NP [facility] None    Plan:   During the course of the visit the patient was educated and counseled about appropriate screening and preventive services including:    Pneumococcal vaccine   Prevnar 13  Influenza vaccine  Td vaccine  Screening electrocardiogram  Bone densitometry  screening  Colorectal cancer screening  Diabetes screening  Glaucoma screening  Nutrition counseling   Advanced directives: requested  Subjective:  Elizabeth May is a 73 y.o. female who presents for Medicare Annual Wellness Visit and 3 month follow up for HTN, DM, chol.   She is spending a lot of time in [facility].   She takes trazodone at night for sleep.   BMI is Body mass index is 23.36 kg/m., she has been working on diet and exercise, walks 2 miles daily.  Wt Readings from Last 3 Encounters:  12/14/18 134 lb (60.8 kg)  05/25/18 146 lb 6.4 oz (66.4 kg)  02/08/18 144 lb (65.3 kg)    Her blood pressure has been controlled at home, today their BP is BP: 114/74 She does workout. She denies chest pain, shortness of breath, dizziness.  She does walk on daily and checks blood pressure occasionally at home.    She is on cholesterol medication (atorvastatin 40 mg daily) and denies myalgias. Her cholesterol is at goal. The cholesterol last visit was:   Lab Results  Component Value Date   CHOL 124 05/25/2018   HDL 47 (L) 05/25/2018   LDLCALC 55 05/25/2018   TRIG 141 05/25/2018   CHOLHDL 2.6 05/25/2018   She has had diabetes. She has been working on diet and exercise for diabetes (metformin, glipizide 10 mg TID), and denies foot ulcerations, hyperglycemia, hypoglycemia , increased appetite, nausea, paresthesia of the feet, polydipsia, polyuria, visual disturbances, vomiting and weight loss. She admits hasn't checked glucose in 1 month, previously reports was ranging 130-140s. Last A1C in  the office was:  Lab Results  Component Value Date   HGBA1C 8.7 (H) 05/25/2018   Lab Results  Component Value Date   GFRNONAA 49 (L) 05/25/2018   Patient is on Vitamin D supplement.   Lab Results  Component Value Date   VD25OH 49 05/25/2018     She is on thyroid medication. Her medication was not changed last visit.   Lab Results  Component Value Date   TSH 2.41 05/25/2018     Medication Review: Current Outpatient Medications on File Prior to Visit  Medication Sig Dispense Refill  . albuterol (PROVENTIL HFA;VENTOLIN HFA) 108 (90 Base) MCG/ACT inhaler Inhale 2 puffs into the lungs every 6 (six) hours as needed for wheezing or shortness of breath (cough). 1 Inhaler 0  . amLODipine (NORVASC) 5 MG tablet Take 1 tablet (5 mg total) by mouth daily. 90 tablet 1  . atorvastatin (LIPITOR) 40 MG tablet Take 1 tablet Daily for Cholesterol 90 tablet 3  . azelastine (ASTELIN) 0.1 % nasal spray Place 2 sprays into both nostrils 2 (two) times daily. Use in each nostril as directed 30 mL 2  . Biotin 10 MG TABS Take by mouth daily.    . Calcium Carbonate-Vitamin D (CALCIUM-VITAMIN D) 500-200 MG-UNIT per tablet Take 1 tablet by mouth 2 (two) times daily with a meal.    . CINNAMON PO Take 1,000 Units by mouth daily. 2 capsules daily    . fluticasone (FLONASE) 50 MCG/ACT nasal spray Place 2 sprays into both nostrils daily. 16 g 0  . glipiZIDE (GLUCOTROL) 10 MG tablet TAKE 1 TABLET BY MOUTH 3  TIMES DAILY WITH MEALS 270 tablet 3  . levothyroxine (SYNTHROID, LEVOTHROID) 50 MCG tablet Patient request [employer] manufacturer. 90 tablet 1  . lisinopril-hydrochlorothiazide (ZESTORETIC) 20-25 MG tablet Take 1 tablet Daily for BP 90 tablet 3  . Magnesium 250 MG TABS Take 250 mg by mouth daily.    . metFORMIN (GLUCOPHAGE-XR) 500 MG 24 hr tablet TAKE 2 TABLETS BY MOUTH  TWICE A DAY WITH MEALS FOR  DIABETES 360 tablet 1  . traMADol (ULTRAM) 50 MG tablet Take 1 tab as needed up to every 6 hours for severe pain. 20 tablet 0  . traZODone (DESYREL) 150 MG tablet Take 1 tablet (150 mg total) by mouth at bedtime. Take 1/2 to 1 tablet at bedtime as needed for sleep 90 tablet 2   No current facility-administered medications on file prior to visit.     Current Problems (verified) Patient Active Problem List   Diagnosis Date Noted  . RBBB with left anterior fascicular block 02/08/2018  . Insomnia  10/27/2017  . CKD stage 3 due to type 2 diabetes mellitus (HCC) 06/27/2017  . BMI 24.0-24.9, adult 06/27/2017  . Medication management 03/08/2014  . Hyperlipidemia associated with type 2 diabetes mellitus (HCC) 03/21/2013  . Vitamin D deficiency 03/21/2013  . Type 2 diabetes mellitus (HCC) 03/17/2013  . Hypothyroidism   . Cervical radiculopathy at C6 10/22/2011  . Essential hypertension 07/01/2009    Screening Tests Immunization History  Administered Date(s) Administered  . Influenza Split 02/13/2013  . Influenza, High Dose Seasonal PF 12/07/2014, 12/09/2015, 12/15/2016, 02/08/2018  . Influenza-Unspecified 03/26/2014  . Pneumococcal Conjugate-13 12/09/2015  . Pneumococcal-Unspecified 03/23/2008  . Tdap 07/17/2010  . Zoster 01/14/2012    Preventative care: Last colonoscopy: 2006 cologuard 12/2015 by Dr. Loreta Ave, neg, wants to wait to complete after Christmas due to travel  Last mammogram: 04/2018 DEXA:2013, will order  Echo 2011 CXR 2013  Cervival neck 2013  Immunizations up to date except due flu shot today   Names of Other Physician/Practitioners you currently use: 1. [facility] here for primary care 2. [facility], last visit 2019 - DUE  3. Dr. Dulce SellarAnnie Reed, dentist, last visit 2019  Patient Care Team: Lucky CowboyMcKeown, William, MD as PCP - General (Internal Medicine) Kathleene HazelMcAlhany, Christopher D, MD as Consulting Physician (Cardiology) Charna ElizabethMann, Jyothi, MD as Consulting Physician (Gastroenterology) Estill Bambergumonski, Mark, MD as Consulting Physician (Orthopedic Surgery) Freddy FinnerNeal, W Ronald, MD as Consulting Physician (Obstetrics and Gynecology)  Allergies No Known Allergies  SURGICAL HISTORY She  has a past surgical history that includes Breast surgery (80's) and Anterior cervical decomp/discectomy fusion (10/22/2011). FAMILY HISTORY Her family history includes Diabetes in her maternal aunt, maternal grandmother, and maternal uncle;  Drug abuse in her maternal grandmother; Heart disease in her mother; Hyperlipidemia in her mother; Hypertension in her mother. SOCIAL HISTORY She  reports that she has never smoked. She has never used smokeless tobacco. She reports current alcohol use of about 14.0 standard drinks of alcohol per week. She reports that she does not use drugs.   MEDICARE WELLNESS OBJECTIVES: Physical activity: Current Exercise Habits: Home exercise routine, Type of exercise: walking, Time (Minutes): 60, Frequency (Times/Week): 7, Weekly Exercise (Minutes/Week): 420, Intensity: Mild, Exercise limited by: None identified Cardiac risk factors: Cardiac Risk Factors include: advanced age (>2455men, 96>65 women);diabetes mellitus;hypertension;dyslipidemia Depression/mood screen:   Depression [facility] 12/14/2018  Decreased Interest 0  Down, Depressed, Hopeless 0  PHQ - 2 Score 0    ADLs:  In your present state of health, do you have any difficulty performing the following activities: 12/14/2018 05/25/2018  Hearing? N N  Vision? N N  Difficulty concentrating or making decisions? N N  Walking or climbing stairs? N N  Dressing or bathing? N N  Doing errands, shopping? N N  Some recent data might be hidden     Cognitive Testing  Alert? Yes  Normal Appearance?Yes  Oriented to person? Yes  Place? Yes   Time? Yes  Recall of three objects?  Yes  Can perform simple calculations? Yes  Displays appropriate judgment?Yes  Can read the correct time from a watch face?Yes  EOL planning: Does Patient Have a Medical Advance Directive?: No Would patient like information on creating a medical advance directive?: No - Patient declined  Review of Systems  Constitutional: Negative for chills, fever and malaise/fatigue.  HENT: Negative for congestion, ear pain, hearing loss, nosebleeds, sore throat and tinnitus.   Eyes: Negative for blurred vision and double vision.  Respiratory: Negative for cough, shortness of breath and  wheezing.   Cardiovascular: Negative for chest pain, palpitations and leg swelling.  Gastrointestinal: Negative for blood in stool, constipation, diarrhea, heartburn and melena.  Genitourinary: Negative.   Neurological: Negative for dizziness, sensory change, loss of consciousness and headaches.  Psychiatric/Behavioral: Negative for depression. The patient has insomnia. The patient is not nervous/anxious.      Objective:     Today's Vitals   12/14/18 1025  BP: 114/74  Pulse: 68  Temp: 97.7 F (36.5 C)  SpO2: 98%  Weight: 134 lb (60.8 kg)  Height: 5' 3.5" (1.613 m)   Body mass index is 23.36 kg/m.  General appearance: alert, no distress, WD/WN, female HEENT: normocephalic, sclerae anicteric, TMs pearly, nares patent, no discharge or erythema, pharynx normal Oral cavity: MMM, no lesions Neck: supple, no lymphadenopathy, no thyromegaly, no masses Heart: RRR,  normal S1, S2, no murmurs Lungs: CTA bilaterally, no wheezes, rhonchi, or rales Abdomen: +bs, soft, non tender, non distended, no masses, no hepatomegaly, no splenomegaly Musculoskeletal: nontender, no swelling, no obvious deformity Extremities: no edema, no cyanosis, no clubbing Pulses: 2+ symmetric, upper and lower extremities, normal cap refill Neurological: alert, oriented x 3, CN2-12 intact, strength normal upper extremities and lower extremities, sensation normal throughout, DTRs 2+ throughout, no cerebellar signs, gait normal Psychiatric: normal affect, behavior normal, pleasant   Medicare Attestation I have personally reviewed: The patient's medical and social history Their use of alcohol, tobacco or illicit drugs Their current medications and supplements The patient's functional ability including ADLs,fall risks, home safety risks, cognitive, and hearing and visual impairment Diet and physical activities Evidence for depression or mood disorders  The patient's weight, height, BMI, and visual acuity have been  recorded in the chart.  I have made referrals, counseling, and provided education to the patient based on review of the above and I have provided the patient with a written personalized care plan for preventive services.     Dan Maker, NP   12/14/2018

## 2018-12-14 NOTE — Patient Instructions (Addendum)
  Elizabeth May , Thank you for taking time to come for your Medicare Wellness Visit. I appreciate your ongoing commitment to your health goals. Please review the following plan we discussed and let me know if I can assist you in the future.   These are the goals we discussed: Goals    . Weight (lb) < 140 lb (63.5 kg)       This is a list of the screening recommended for you and due dates:  Health Maintenance  Topic Date Due  .  Hepatitis C: One time screening is recommended by Center for Disease Control  (CDC) for  adults born from [birth year] through 1963.   1945/07/22  . Eye exam for diabetics  12/21/2017  . Cologuard (Stool DNA test)  07/30/2018  . Flu Shot  10/22/2018  . Complete foot exam   10/29/2018  . Hemoglobin A1C  11/25/2018  . Mammogram  05/12/2020  . Tetanus Vaccine  07/16/2020  . DEXA scan (bone density measurement)  Completed  . Pneumonia vaccines  Completed      Please schedule your diabetic eye exam and have the report forwarded to [place]  Please start checking blood sugars at least a few days a week       Know what a healthy weight is for you (roughly BMI <25) and aim to maintain this  Aim for 7+ servings of fruits and vegetables daily  65-80+ fluid ounces of water or unsweet tea for healthy kidneys  Limit to max 1 drink of alcohol per day; avoid smoking/tobacco  Limit animal fats in diet for cholesterol and heart health - choose grass fed whenever available  Avoid highly processed foods, and foods high in saturated/trans fats  Aim for low stress - take time to unwind and care for your mental health  Aim for 150 min of moderate intensity exercise weekly for heart health, and weights twice weekly for bone health  Aim for 7-9 hours of sleep daily

## 2018-12-15 LAB — HEPATITIS C ANTIBODY
Hepatitis C Ab: NONREACTIVE
SIGNAL TO CUT-OFF: 0.59 (ref <1.00)

## 2018-12-24 ENCOUNTER — Other Ambulatory Visit: Payer: Self-pay | Admitting: Internal Medicine

## 2018-12-24 DIAGNOSIS — N183 Chronic kidney disease, stage 3 unspecified: Secondary | ICD-10-CM

## 2018-12-24 DIAGNOSIS — E1122 Type 2 diabetes mellitus with diabetic chronic kidney disease: Secondary | ICD-10-CM

## 2019-01-19 ENCOUNTER — Other Ambulatory Visit: Payer: Self-pay | Admitting: Internal Medicine

## 2019-01-19 DIAGNOSIS — I10 Essential (primary) hypertension: Secondary | ICD-10-CM

## 2019-02-20 ENCOUNTER — Encounter: Payer: Self-pay | Admitting: Adult Health

## 2019-03-28 ENCOUNTER — Encounter: Payer: Self-pay | Admitting: Adult Health

## 2019-03-31 ENCOUNTER — Encounter: Payer: Self-pay | Admitting: Adult Health

## 2019-04-04 NOTE — Progress Notes (Deleted)
Patient ID: Elizabeth May, female   DOB: [birth year], 74 y.o.   MRN: [id number]   CPE AND 3 MONTH FOLLOW UP  Assessment and Plan    Essential hypertension - continue medications, DASH diet, exercise and monitor at home. Call if greater than 130/80.   RBBB Monitor  Type 2 diabetes mellitus with stage 2 chronic kidney disease, without long-term current use of insulin (HCC) Discussed general issues about diabetes pathophysiology and management., Educational material distributed., Suggested low cholesterol diet., Encouraged aerobic exercise., Discussed foot care., Reminded to get yearly retinal exam. -     Hemoglobin A1c  CKD 2 associated with T2DM (HCC) Increase fluids, avoid NSAIDS, monitor sugars, will monitor  Hypothyroidism, unspecified type Hypothyroidism-check TSH level, continue medications the same, reminded to take on an empty stomach 30-63mins before food.  -     TSH  Cervical radiculopathy at C6 s/p fusion, takes tramadol PRN, mainly for travel  Mixed hyperlipidemia associated with T2DM (HCC) LDL goal <70 -continue medications, check lipids, decrease fatty foods, increase activity.  -     Lipid panel  Vitamin D deficiency Continue supplementation Check vitamin D level  Insomnia - trazodone is beneficial - good sleep hygiene discussed, increase day time activity  Medication management -     Magnesium  No orders of the defined types were placed in this encounter.    Over 40 minutes of exam, counseling, chart review and critical decision making was performed Future Appointments  Date Time Provider Department Center  04/06/2019  9:00 AM Judd Gaudier, NP [facility] None  12/19/2019 10:00 AM Judd Gaudier, NP [facility] None  03/27/2020  9:00 AM Judd Gaudier, NP [facility] None    Subjective:  Elizabeth May is a 74 y.o. female who presents for CPE and 3 month follow up for HTN, DM, chol. She has Essential hypertension; Cervical radiculopathy at C6;  Type 2 diabetes mellitus (HCC); Hypothyroidism; Hyperlipidemia associated with type 2 diabetes mellitus (HCC); Vitamin D deficiency; Medication management; CKD stage 3 due to type 2 diabetes mellitus (HCC); BMI 24.0-24.9, adult; Insomnia; and RBBB with left anterior fascicular block on their problem list.    *** insurance  She is married, has 1 adopted daughter, retired as a Runner, broadcasting/film/video.   She is just back from 2 weeks in [place] head.  She is taking trazodone for insomnia occasionally if needed, takes when traveling.  She has hx of cervical fusion for radiculopathy at C6; she takes tramadol PRN occasionally for pain while on long trips.  BMI is There is no height or weight on file to calculate BMI., she has been working on diet and exercise. Wt Readings from Last 3 Encounters:  12/14/18 134 lb (60.8 kg)  05/25/18 146 lb 6.4 oz (66.4 kg)  02/08/18 144 lb (65.3 kg)   She has had elevated blood pressure for years. Her blood pressure has been controlled at home, today their BP is   She does workout. She denies chest pain, shortness of breath, dizziness.    She is on cholesterol medication (atorvastatin 40 mg daily) and denies myalgias. Her cholesterol is at goal. The cholesterol last visit was:   Lab Results  Component Value Date   CHOL 124 05/25/2018   HDL 47 (L) 05/25/2018   LDLCALC 55 05/25/2018   TRIG 141 05/25/2018   CHOLHDL 2.6 05/25/2018   She has had diabetes controlled with metformin and glipizide BID. She has been working on diet and exercise for diabetes with CKD, and denies foot ulcerations,  hyperglycemia, hypoglycemia , increased appetite, nausea, paresthesia of the feet, polydipsia, polyuria, visual disturbances, vomiting and weight loss. She does check occasional fasting glucose, reports 120-125 typically. Last A1C in the office was:  Lab Results  Component Value Date   HGBA1C 8.7 (H) 05/25/2018   Lab Results  Component Value Date   GFRNONAA 49 (L) 05/25/2018   Patient is  on Vitamin D supplement.   Lab Results  Component Value Date   VD25OH 49 05/25/2018     She is on thyroid medication. Her medication was not changed last visit.   Lab Results  Component Value Date   TSH 2.41 05/25/2018     Lab Results  Component Value Date   WBC 9.1 05/25/2018   HGB 14.5 05/25/2018   HCT 43.4 05/25/2018   MCV 85.6 05/25/2018   PLT 389 05/25/2018   Lab Results  Component Value Date   IRON 86 02/08/2018   TIBC 349 02/08/2018   Lab Results  Component Value Date   VITAMINB12 343 02/08/2018     Medication Review: Current Outpatient Medications on File Prior to Visit  Medication Sig Dispense Refill  . albuterol (PROVENTIL HFA;VENTOLIN HFA) 108 (90 Base) MCG/ACT inhaler Inhale 2 puffs into the lungs every 6 (six) hours as needed for wheezing or shortness of breath (cough). 1 Inhaler 0  . amLODipine (NORVASC) 5 MG tablet Take 1 tablet Daily for BP 90 tablet 0  . atorvastatin (LIPITOR) 40 MG tablet Take 1 tablet Daily for Cholesterol 90 tablet 3  . azelastine (ASTELIN) 0.1 % nasal spray Place 2 sprays into both nostrils 2 (two) times daily. Use in each nostril as directed 30 mL 2  . Biotin 10 MG TABS Take by mouth daily.    . Calcium Carbonate-Vitamin D (CALCIUM-VITAMIN D) 500-200 MG-UNIT per tablet Take 1 tablet by mouth 2 (two) times daily with a meal.    . CINNAMON PO Take 1,000 Units by mouth daily. 2 capsules daily    . fluticasone (FLONASE) 50 MCG/ACT nasal spray Place 2 sprays into both nostrils daily. 16 g 0  . glipiZIDE (GLUCOTROL) 10 MG tablet TAKE 1 TABLET BY MOUTH 3  TIMES DAILY WITH MEALS 270 tablet 3  . levothyroxine (SYNTHROID, LEVOTHROID) 50 MCG tablet Patient request [employer] manufacturer. 90 tablet 1  . lisinopril-hydrochlorothiazide (ZESTORETIC) 20-25 MG tablet Take 1 tablet Daily for BP 90 tablet 3  . Magnesium 250 MG TABS Take 250 mg by mouth daily.    . metFORMIN (GLUCOPHAGE-XR) 500 MG 24 hr tablet Take 2 tablets 2 x /day with Meals for  Diabetes 360 tablet 3  . traMADol (ULTRAM) 50 MG tablet Take 1 tab as needed up to every 6 hours for severe pain. 20 tablet 0  . traZODone (DESYREL) 150 MG tablet Take 1 tablet (150 mg total) by mouth at bedtime. Take 1/2 to 1 tablet at bedtime as needed for sleep 90 tablet 2   No current facility-administered medications on file prior to visit.    Current Problems (verified) Patient Active Problem List   Diagnosis Date Noted  . RBBB with left anterior fascicular block 02/08/2018  . Insomnia 10/27/2017  . CKD stage 3 due to type 2 diabetes mellitus ([place]) 06/27/2017  . BMI 24.0-24.9, adult 06/27/2017  . Medication management 03/08/2014  . Hyperlipidemia associated with type 2 diabetes mellitus ([place]) 03/21/2013  . Vitamin D deficiency 03/21/2013  . Type 2 diabetes mellitus ([place]) 03/17/2013  . Hypothyroidism   . Cervical radiculopathy at C6 10/22/2011  .  Essential hypertension 07/01/2009    Screening Tests Immunization History  Administered Date(s) Administered  . Influenza Split 02/13/2013  . Influenza, High Dose Seasonal PF 12/07/2014, 12/09/2015, 12/15/2016, 02/08/2018, 12/16/2018  . Influenza-Unspecified 03/26/2014  . Pneumococcal Conjugate-13 12/09/2015  . Pneumococcal-Unspecified 03/23/2008  . Tdap 07/17/2010  . Zoster 01/14/2012   Preventative care: Last colonoscopy: 2006 cologuard 12/2015 by Dr. Loreta Ave, neg, DUE *** Last mammogram: 04/2018 [id number], ordered and pending to be obtained with next mammogram ***  Echo 2011 CXR 2013 Cervival neck 2013  Immunizations up to date   Names of Other Physician/Practitioners you currently use: 1. [facility] here for primary care 2. Dr. Caryn Section at [facility], eye doctor, last visit 2019 - DUE  3. Dr. Dulce Sellar, dentist, last visit 2019 4. Dr. Marland Kitchen Dermatologist, last visit 3-4 months  Patient Care Team: [name], MD as PCP - General (Internal Medicine) Kathleene Hazel,  MD as Consulting Physician (Cardiology) Charna Elizabeth, MD as Consulting Physician (Gastroenterology) Estill Bamberg, MD as Consulting Physician (Orthopedic Surgery) Freddy Finner, MD as Consulting Physician (Obstetrics and Gynecology)  Allergies No Known Allergies  SURGICAL HISTORY She  has a past surgical history that includes Breast surgery (80's) and Anterior cervical decomp/discectomy fusion (10/22/2011). FAMILY HISTORY Her family history includes Diabetes in her maternal aunt, maternal grandmother, and maternal uncle; Drug abuse in her maternal grandmother; Heart disease in her mother; Hyperlipidemia in her mother; Hypertension in her mother. SOCIAL HISTORY She  reports that she has never smoked. She has never used smokeless tobacco. She reports current alcohol use of about 14.0 standard drinks of alcohol per week. She reports that she does not use drugs.   Review of Systems  Constitutional: Negative for chills, fever and malaise/fatigue.  HENT: Negative for congestion, ear pain, hearing loss, nosebleeds, sore throat and tinnitus.   Eyes: Negative for blurred vision and double vision.  Respiratory: Negative for cough, shortness of breath and wheezing.   Cardiovascular: Negative for chest pain, palpitations and leg swelling.  Gastrointestinal: Negative for blood in stool, constipation, diarrhea, heartburn and melena.  Genitourinary: Negative.   Neurological: Negative for dizziness, sensory change, loss of consciousness and headaches.  Psychiatric/Behavioral: Negative for depression. The patient has insomnia. The patient is not nervous/anxious.      Objective:     There were no vitals filed for this visit. There is no height or weight on file to calculate BMI.  General appearance: alert, no distress, WD/WN, female HEENT: normocephalic, sclerae anicteric, TMs pearly, nares patent, no discharge or erythema, pharynx normal Oral cavity: MMM, no lesions Neck: supple, no  lymphadenopathy, no thyromegaly, no masses Heart: RRR, normal S1, S2, no murmurs Lungs: CTA bilaterally, no wheezes, rhonchi, or rales Abdomen: +bs, soft, non tender, non distended, no masses, no hepatomegaly, no splenomegaly Musculoskeletal: nontender, no swelling, no obvious deformity Extremities: no edema, no cyanosis, no clubbing Pulses: 2+ symmetric, upper and lower extremities, normal cap refill Neurological: alert, oriented x 3, CN2-12 intact, strength normal upper extremities and lower extremities, sensation normal throughout, DTRs 2+ throughout, no cerebellar signs, gait normal Psychiatric: normal affect, behavior normal, pleasant  Skin: Warm/dry, intact, without notable lesions, ecchymosis, rash Breasts: breasts appear normal, no suspicious masses, no skin or nipple changes or axillary nodes.  EKG: WNL, RBBB, no ST changes   Dan Maker, NP   04/04/2019

## 2019-04-06 ENCOUNTER — Other Ambulatory Visit: Payer: Self-pay

## 2019-04-06 ENCOUNTER — Encounter: Payer: Self-pay | Admitting: Adult Health

## 2019-04-10 ENCOUNTER — Other Ambulatory Visit: Payer: Self-pay | Admitting: Internal Medicine

## 2019-04-10 DIAGNOSIS — I10 Essential (primary) hypertension: Secondary | ICD-10-CM

## 2019-04-21 ENCOUNTER — Ambulatory Visit: Payer: BC Managed Care – PPO

## 2019-05-08 ENCOUNTER — Ambulatory Visit: Payer: BC Managed Care – PPO

## 2019-05-10 ENCOUNTER — Other Ambulatory Visit: Payer: Self-pay | Admitting: Internal Medicine

## 2019-05-10 DIAGNOSIS — E039 Hypothyroidism, unspecified: Secondary | ICD-10-CM

## 2019-05-10 DIAGNOSIS — I10 Essential (primary) hypertension: Secondary | ICD-10-CM

## 2019-05-10 MED ORDER — LEVOTHYROXINE SODIUM 50 MCG PO TABS: ORAL_TABLET | ORAL | 0 refills | Status: DC

## 2019-05-10 MED ORDER — AMLODIPINE BESYLATE 5 MG PO TABS: ORAL_TABLET | ORAL | 0 refills | Status: DC

## 2019-05-12 ENCOUNTER — Other Ambulatory Visit: Payer: Self-pay | Admitting: Internal Medicine

## 2019-05-12 DIAGNOSIS — E039 Hypothyroidism, unspecified: Secondary | ICD-10-CM

## 2019-05-12 DIAGNOSIS — I10 Essential (primary) hypertension: Secondary | ICD-10-CM

## 2019-05-12 MED ORDER — LEVOTHYROXINE SODIUM 50 MCG PO TABS: ORAL_TABLET | ORAL | 0 refills | Status: DC

## 2019-05-12 MED ORDER — AMLODIPINE BESYLATE 5 MG PO TABS: ORAL_TABLET | ORAL | 0 refills | Status: DC

## 2019-05-15 NOTE — Progress Notes (Signed)
Patient ID: [name], female   DOB: [birth year], 74 y.o.   MRN: [id number]   CPE AND 3 MONTH FOLLOW UP  Assessment and Plan    Essential hypertension - continue medications, DASH diet, exercise and monitor at home. Call if greater than 130/80.  -     CBC with Differential/Platelet -     CMP/GFR -     TSH  RBBB with LAFB Monitor, get EKG  Type 2 diabetes mellitus with stage 3 chronic kidney disease, without long-term current use of insulin (HCC) Discussed general issues about diabetes pathophysiology and management., Educational material distributed., Suggested low cholesterol diet., Encouraged aerobic exercise., Discussed foot care., Reminded to get yearly retinal exam. -     Hemoglobin A1c  Hypothyroidism, unspecified type Hypothyroidism-check TSH level, continue medications the same, reminded to take on an empty stomach 30-38mins before food.  -     TSH  Cervical radiculopathy at C6 s/p fusion, takes tramadol PRN, mainly for travel  Hyperlipidemia associated with T2DM (HCC) Continue statin for LDL goal <70 check lipids, decrease fatty foods, increase activity.  -     Lipid panel  CKD IIIa associated with T2DM (HCC) Increase fluids, avoid NSAIDS, monitor sugars, will monitor  Vitamin D deficiency Continue supplementation Check vitamin D level  Insomnia - trazodone is beneficial - good sleep hygiene discussed, increase day time activity  Medication management -     Magnesium  Colon cancer screening - patient declines a colonoscopy even though the risks and benefits were discussed at length. Colon cancer is 3rd most diagnosed cancer and 2nd leading cause of death in both men and women 48 years of age and older. Patient understands the risk of cancer and death with declining the test however they are willing to do cologuard screening instead. They understand that this is not as sensitive or specific as a colonoscopy and they are still recommended to get a  colonoscopy. The cologuard will be sent out to their house.   Declines mammogram, bone density, diabetic eye, dental exams in light of pandemic; encouraged to schedule later this year  Orders Placed This Encounter  Procedures  . CBC with Differential/Platelet  . COMPLETE METABOLIC PANEL WITH GFR  . Magnesium  . Lipid panel  . TSH  . Hemoglobin A1c  . VITAMIN D 25 Hydroxy (Vit-D Deficiency, Fractures)  . Vitamin B12  . Microalbumin / creatinine urine ratio  . Urinalysis, Routine w reflex microscopic  . Iron, TIBC and Ferritin Panel  . Cologuard  . EKG 12-Lead  . HM DIABETES FOOT EXAM     Over 40 minutes of exam, counseling, chart review and critical decision making was performed Future Appointments  Date Time Provider Department Center  12/19/2019 10:00 AM Judd Gaudier, NP [facility] None  05/14/2020  2:00 PM Judd Gaudier, NP [facility] None    Subjective:  Elizabeth May is a 74 y.o. female who presents for CPE. She has Essential hypertension; Cervical radiculopathy at C6; Type 2 diabetes mellitus (HCC); Hypothyroidism; Hyperlipidemia associated with type 2 diabetes mellitus (HCC); Vitamin D deficiency; Medication management; CKD stage 3 due to type 2 diabetes mellitus (HCC); BMI 23.0-23.9, adult; Insomnia; and RBBB with left anterior fascicular block on their problem list.   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   She is taking trazodone for insomnia occasionally if needed, takes when traveling.  She has hx of cervical fusion for radiculopathy at C6; she  takes tramadol PRN occasionally for pain while on long trips.  BMI is Body mass index is 23.74 kg/m., she has been working on diet and exercise, eating less, walking 1 hour daily since pandemic.  Wt Readings from Last 3 Encounters:  12/14/18 134 lb (60.8 kg)  05/25/18 146 lb 6.4 oz (66.4 kg)  02/08/18 144 lb (65.3 kg)   She has had elevated blood pressure  for years. Her blood pressure has been controlled at home, today their BP is   She does workout. She denies chest pain, shortness of breath, dizziness.    She is on cholesterol medication (atorvastatin 40 mg daily) and denies myalgias. Her cholesterol is at goal. The cholesterol last visit was:   Lab Results  Component Value Date   CHOL 124 05/25/2018   HDL 47 (L) 05/25/2018   LDLCALC 55 05/25/2018   TRIG 141 05/25/2018   CHOLHDL 2.6 05/25/2018   She has had diabetes with CKD IIIa treated with metformin and glipizide 10 mg TID. She has been working on diet and exercise for diabetes with CKD, and denies foot ulcerations, hyperglycemia, hypoglycemia , increased appetite, nausea, paresthesia of the feet, polydipsia, polyuria, visual disturbances, vomiting and weight loss. She admits hasn't been checking sugars in a month, needs to pick up strips. Last A1C in the office was:  Lab Results  Component Value Date   HGBA1C 8.7 (H) 05/25/2018   She has CKD IIIa associated with T2DM monitored at this office:  Lab Results  Component Value Date   GFRNONAA 49 (L) 05/25/2018   Patient is on Vitamin D supplement.   Lab Results  Component Value Date   VD25OH 49 05/25/2018     She is on thyroid medication. Her medication was not changed last visit.   Lab Results  Component Value Date   TSH 2.41 05/25/2018       Medication Review: Current Outpatient Medications on File Prior to Visit  Medication Sig Dispense Refill  . albuterol (PROVENTIL HFA;VENTOLIN HFA) 108 (90 Base) MCG/ACT inhaler Inhale 2 puffs into the lungs every 6 (six) hours as needed for wheezing or shortness of breath (cough). 1 Inhaler 0  . amLODipine (NORVASC) 5 MG tablet Take 1 tablet Daily for BP 90 tablet 0  . atorvastatin (LIPITOR) 40 MG tablet Take 1 tablet Daily for Cholesterol 90 tablet 3  . azelastine (ASTELIN) 0.1 % nasal spray Place 2 sprays into both nostrils 2 (two) times daily. Use in each nostril as directed 30 mL 2   . Biotin 10 MG TABS Take by mouth daily.    . Calcium Carbonate-Vitamin D (CALCIUM-VITAMIN D) 500-200 MG-UNIT per tablet Take 1 tablet by mouth 2 (two) times daily with a meal.    . CINNAMON PO Take 1,000 Units by mouth daily. 2 capsules daily    . fluticasone (FLONASE) 50 MCG/ACT nasal spray Place 2 sprays into both nostrils daily. 16 g 0  . glipiZIDE (GLUCOTROL) 10 MG tablet TAKE 1 TABLET BY MOUTH 3  TIMES DAILY WITH MEALS 270 tablet 3  . levothyroxine (SYNTHROID) 50 MCG tablet Take 1 tablet daily on an empty stomach with only water for 30 minutes & no Antacid meds, Calcium or Magnesium for 4 hours & avoid Biotin 90 tablet 0  . lisinopril-hydrochlorothiazide (ZESTORETIC) 20-25 MG tablet Take 1 tablet Daily for BP 90 tablet 3  . Magnesium 250 MG TABS Take 250 mg by mouth daily.    . metFORMIN (GLUCOPHAGE-XR) 500 MG 24 hr tablet Take 2  tablets 2 x /day with Meals for Diabetes 360 tablet 3  . traMADol (ULTRAM) 50 MG tablet Take 1 tab as needed up to every 6 hours for severe pain. 20 tablet 0  . traZODone (DESYREL) 150 MG tablet Take 1 tablet (150 mg total) by mouth at bedtime. Take 1/2 to 1 tablet at bedtime as needed for sleep 90 tablet 2   No current facility-administered medications on file prior to visit.    Current Problems (verified) Patient Active Problem List   Diagnosis Date Noted  . RBBB with left anterior fascicular block 02/08/2018  . Insomnia 10/27/2017  . CKD stage 3 due to type 2 diabetes mellitus (HCC) 06/27/2017  . BMI 23.0-23.9, adult 06/27/2017  . Medication management 03/08/2014  . Hyperlipidemia associated with type 2 diabetes mellitus (HCC) 03/21/2013  . Vitamin D deficiency 03/21/2013  . Type 2 diabetes mellitus (HCC) 03/17/2013  . Hypothyroidism   . Cervical radiculopathy at C6 10/22/2011  . Essential hypertension 07/01/2009    Screening Tests Immunization History  Administered Date(s) Administered  . Influenza Split 02/13/2013  . Influenza, High Dose  Seasonal PF 12/07/2014, 12/09/2015, 12/15/2016, 02/08/2018, 12/16/2018  . Influenza-Unspecified 03/26/2014  . PFIZER SARS-COV-2 Vaccination 04/20/2019  . Pneumococcal Conjugate-13 12/09/2015  . Pneumococcal-Unspecified 03/23/2008  . Tdap 07/17/2010  . Zoster 01/14/2012  . Zoster Recombinat (Shingrix) 12/16/2018, 04/06/2019   Tdap: 2012 Influenza: 11/2018 Pneumonia: 2010 Prevnar: 2017 Shingrix: 2/2 2020  Preventative care: Last colonoscopy: 2006 cologuard 12/2015 by Dr. Loreta Ave, neg, DUE Last mammogram: 04/2018 - deferring due to pandemic DEXA:2013 - patient declines at this time  Echo 2011 CXR 2013 Cervival neck 2013  Names of Other Physician/Practitioners you currently use: 1. [facility] here for primary care 2. Dr. Caryn Section at [facility], eye doctor, last visit 12/21/2016, DUE - patient declines due to covid 19  3. Dr. Dulce Sellar, dentist, last visit 2019, goes q53m, declines at this time due to pandemic  4. Dr. Marland Kitchen Dermatologist, last visit 2020  Patient Care Team: [name], MD as PCP - General (Internal Medicine) Kathleene Hazel, MD as Consulting Physician (Cardiology) Charna Elizabeth, MD as Consulting Physician (Gastroenterology) Estill Bamberg, MD as Consulting Physician (Orthopedic Surgery) Freddy Finner, MD as Consulting Physician (Obstetrics and Gynecology)  Allergies No Known Allergies  SURGICAL HISTORY She  has a past surgical history that includes Breast surgery (80's) and Anterior cervical decomp/discectomy fusion (10/22/2011). FAMILY HISTORY Her family history includes Alzheimer's disease in her father, paternal aunt, and paternal uncle; Diabetes in her maternal aunt, maternal grandmother, and maternal uncle; Drug abuse in her maternal grandmother; Heart disease in her mother; Hyperlipidemia in her mother; Hypertension in her mother. SOCIAL HISTORY She  reports that she has never smoked. She has never used smokeless  tobacco. She reports current alcohol use of about 7.0 standard drinks of alcohol per week. She reports that she does not use drugs.   Review of Systems  Constitutional: Negative for chills, fever and malaise/fatigue.  HENT: Negative for congestion, ear pain, hearing loss, nosebleeds, sore throat and tinnitus.   Eyes: Negative for blurred vision and double vision.  Respiratory: Negative for cough, shortness of breath and wheezing.   Cardiovascular: Negative for chest pain, palpitations and leg swelling.  Gastrointestinal: Negative for blood in stool, constipation, diarrhea, heartburn and melena.  Genitourinary: Negative.   Neurological: Negative for dizziness, sensory change, loss of consciousness and headaches.  Psychiatric/Behavioral: Negative for depression. The patient has insomnia. The patient is not  nervous/anxious.      Objective:     Today's Vitals   05/16/19 1511  Pulse: 77  Temp: (!) 97.5 F (36.4 C)  SpO2: 98%  Height: 5\' 3"  (1.6 m)   Body mass index is 23.74 kg/m.  General appearance: alert, no distress, WD/WN, female HEENT: normocephalic, sclerae anicteric, TMs pearly, nares patent, no discharge or erythema, pharynx normal Oral cavity: MMM, no lesions Neck: supple, no lymphadenopathy, no thyromegaly, no masses Heart: RRR, normal S1, S2, no murmurs Lungs: CTA bilaterally, no wheezes, rhonchi, or rales Abdomen: +bs, soft, non tender, non distended, no masses, no hepatomegaly, no splenomegaly Musculoskeletal: nontender, no swelling, no obvious deformity Extremities: no edema, no cyanosis, no clubbing Pulses: 2+ symmetric, upper and lower extremities, normal cap refill Neurological: alert, oriented x 3, CN2-12 intact, strength normal upper extremities and lower extremities, sensation normal throughout, DTRs 2+ throughout, no cerebellar signs, gait normal Psychiatric: normal affect, behavior normal, pleasant  Skin: Warm/dry, intact, without notable lesions, ecchymosis,  rash Breasts: breasts appear normal, no suspicious masses, no skin or nipple changes or axillary nodes.   EKG: WNL, RBBB with left anterior fascicular block, NSCPT   Izora Ribas, NP   05/16/2019

## 2019-05-16 ENCOUNTER — Other Ambulatory Visit: Payer: Self-pay

## 2019-05-16 ENCOUNTER — Ambulatory Visit: Payer: Medicare PPO | Admitting: Adult Health

## 2019-05-16 ENCOUNTER — Encounter: Payer: Self-pay | Admitting: Adult Health

## 2019-05-16 VITALS — BP 124/74 | HR 77 | Temp 97.5°F | Ht 63.0 in | Wt 129.0 lb

## 2019-05-16 DIAGNOSIS — Z0001 Encounter for general adult medical examination with abnormal findings: Secondary | ICD-10-CM | POA: Diagnosis not present

## 2019-05-16 DIAGNOSIS — I452 Bifascicular block: Secondary | ICD-10-CM | POA: Diagnosis not present

## 2019-05-16 DIAGNOSIS — E559 Vitamin D deficiency, unspecified: Secondary | ICD-10-CM

## 2019-05-16 DIAGNOSIS — E1122 Type 2 diabetes mellitus with diabetic chronic kidney disease: Secondary | ICD-10-CM | POA: Diagnosis not present

## 2019-05-16 DIAGNOSIS — E039 Hypothyroidism, unspecified: Secondary | ICD-10-CM

## 2019-05-16 DIAGNOSIS — E1169 Type 2 diabetes mellitus with other specified complication: Secondary | ICD-10-CM

## 2019-05-16 DIAGNOSIS — Z0000 Encounter for general adult medical examination without abnormal findings: Secondary | ICD-10-CM | POA: Diagnosis not present

## 2019-05-16 DIAGNOSIS — Z136 Encounter for screening for cardiovascular disorders: Secondary | ICD-10-CM

## 2019-05-16 DIAGNOSIS — Z6823 Body mass index (BMI) 23.0-23.9, adult: Secondary | ICD-10-CM

## 2019-05-16 DIAGNOSIS — E785 Hyperlipidemia, unspecified: Secondary | ICD-10-CM

## 2019-05-16 DIAGNOSIS — N1831 Chronic kidney disease, stage 3a: Secondary | ICD-10-CM | POA: Diagnosis not present

## 2019-05-16 DIAGNOSIS — D649 Anemia, unspecified: Secondary | ICD-10-CM | POA: Diagnosis not present

## 2019-05-16 DIAGNOSIS — E1121 Type 2 diabetes mellitus with diabetic nephropathy: Secondary | ICD-10-CM | POA: Diagnosis not present

## 2019-05-16 DIAGNOSIS — Z79899 Other long term (current) drug therapy: Secondary | ICD-10-CM

## 2019-05-16 DIAGNOSIS — G4700 Insomnia, unspecified: Secondary | ICD-10-CM

## 2019-05-16 DIAGNOSIS — M5412 Radiculopathy, cervical region: Secondary | ICD-10-CM

## 2019-05-16 DIAGNOSIS — I10 Essential (primary) hypertension: Secondary | ICD-10-CM | POA: Diagnosis not present

## 2019-05-16 DIAGNOSIS — N183 Chronic kidney disease, stage 3 unspecified: Secondary | ICD-10-CM | POA: Diagnosis not present

## 2019-05-16 DIAGNOSIS — Z1211 Encounter for screening for malignant neoplasm of colon: Secondary | ICD-10-CM

## 2019-05-16 NOTE — Progress Notes (Unsigned)
Refill request for cough syrup. Requesting to have on hand, just in case.

## 2019-05-16 NOTE — Patient Instructions (Addendum)
  Elizabeth May , Thank you for taking time to come for your Annual Wellness Visit. I appreciate your ongoing commitment to your health goals. Please review the following plan we discussed and let me know if I can assist you in the future.   These are the goals we discussed: Goals    . HEMOGLOBIN A1C < 7    . LDL CALC < 70       This is a list of the screening recommended for you and due dates:  Health Maintenance  Topic Date Due  . Cologuard (Stool DNA test)  07/30/2018  . Hemoglobin A1C  11/25/2018  . Eye exam for diabetics  08/22/2019*  . Mammogram  05/12/2020  . Complete foot exam   05/15/2020  . Tetanus Vaccine  07/16/2020  . Flu Shot  Completed  . DEXA scan (bone density measurement)  Completed  .  Hepatitis C: One time screening is recommended by Center for Disease Control  (CDC) for  adults born from [birth year] through 1963.   Completed  . Pneumonia vaccines  Completed  *Topic was postponed. The date shown is not the original due date.    Please complete cologuard ASAP  Please schedule a diabetic eye exam and dental exams sometime this year  Please schedule bone density with mammogram sometime this year  HOW TO SCHEDULE A MAMMOGRAM  [facility]  7 a.m.-6:30 p.m., Monday 7 a.m.-5 p.m., Tuesday-Friday Schedule an appointment by calling [phone].  [facility] Schedule an appointment by calling [phone].       Know what a healthy weight is for you (roughly BMI <25) and aim to maintain this  Aim for 7+ servings of fruits and vegetables daily  65-80+ fluid ounces of water or unsweet tea for healthy kidneys  Limit to max 1 drink of alcohol per day; avoid smoking/tobacco  Limit animal fats in diet for cholesterol and heart health - choose grass fed whenever available  Avoid highly processed foods, and foods high in saturated/trans fats  Aim for low stress - take time to unwind and care for your mental health  Aim for 150  min of moderate intensity exercise weekly for heart health, and weights twice weekly for bone health  Aim for 7-9 hours of sleep daily

## 2019-05-17 DIAGNOSIS — E1121 Type 2 diabetes mellitus with diabetic nephropathy: Secondary | ICD-10-CM | POA: Diagnosis not present

## 2019-05-17 DIAGNOSIS — E1122 Type 2 diabetes mellitus with diabetic chronic kidney disease: Secondary | ICD-10-CM | POA: Diagnosis not present

## 2019-05-17 DIAGNOSIS — D649 Anemia, unspecified: Secondary | ICD-10-CM | POA: Diagnosis not present

## 2019-05-17 DIAGNOSIS — Z0001 Encounter for general adult medical examination with abnormal findings: Secondary | ICD-10-CM | POA: Diagnosis not present

## 2019-05-17 DIAGNOSIS — I10 Essential (primary) hypertension: Secondary | ICD-10-CM | POA: Diagnosis not present

## 2019-05-17 DIAGNOSIS — N183 Chronic kidney disease, stage 3 unspecified: Secondary | ICD-10-CM | POA: Diagnosis not present

## 2019-05-17 DIAGNOSIS — E1169 Type 2 diabetes mellitus with other specified complication: Secondary | ICD-10-CM | POA: Diagnosis not present

## 2019-05-17 DIAGNOSIS — N1831 Chronic kidney disease, stage 3a: Secondary | ICD-10-CM | POA: Diagnosis not present

## 2019-05-17 DIAGNOSIS — E785 Hyperlipidemia, unspecified: Secondary | ICD-10-CM | POA: Diagnosis not present

## 2019-05-17 LAB — COMPLETE METABOLIC PANEL WITH GFR
AG Ratio: 1.7 (calc) (ref 1.0–2.5)
ALT: 14 U/L (ref 6–29)
AST: 16 U/L (ref 10–35)
Albumin: 4.4 g/dL (ref 3.6–5.1)
Alkaline phosphatase (APISO): 79 U/L (ref 37–153)
BUN/Creatinine Ratio: 21 (calc) (ref 6–22)
BUN: 23 mg/dL (ref 7–25)
CO2: 28 mmol/L (ref 20–32)
Calcium: 10.1 mg/dL (ref 8.6–10.4)
Chloride: 101 mmol/L (ref 98–110)
Creat: 1.12 mg/dL — ABNORMAL HIGH (ref 0.60–0.93)
GFR, Est African American: 57 mL/min/{1.73_m2} — ABNORMAL LOW (ref >=60)
GFR, Est Non African American: 49 mL/min/{1.73_m2} — ABNORMAL LOW (ref >=60)
Globulin: 2.6 g/dL (calc) (ref 1.9–3.7)
Glucose, Bld: 146 mg/dL — ABNORMAL HIGH (ref 65–99)
Potassium: 4.6 mmol/L (ref 3.5–5.3)
Sodium: 139 mmol/L (ref 135–146)
Total Bilirubin: 0.7 mg/dL (ref 0.2–1.2)
Total Protein: 7 g/dL (ref 6.1–8.1)

## 2019-05-17 LAB — LIPID PANEL
Cholesterol: 116 mg/dL (ref <200)
HDL: 47 mg/dL — ABNORMAL LOW (ref >=50)
LDL Cholesterol (Calc): 49 mg/dL (calc)
Non-HDL Cholesterol (Calc): 69 mg/dL (calc) (ref <130)
Total CHOL/HDL Ratio: 2.5 (calc) (ref <5.0)
Triglycerides: 122 mg/dL (ref <150)

## 2019-05-17 LAB — CBC WITH DIFFERENTIAL/PLATELET
Absolute Monocytes: 583 cells/uL (ref 200–950)
Basophils Absolute: 86 cells/uL (ref 0–200)
Basophils Relative: 0.8 %
Eosinophils Absolute: 108 cells/uL (ref 15–500)
Eosinophils Relative: 1 %
HCT: 43.9 % (ref 35.0–45.0)
Hemoglobin: 14.4 g/dL (ref 11.7–15.5)
Lymphs Abs: 2387 cells/uL (ref 850–3900)
MCH: 28.5 pg (ref 27.0–33.0)
MCHC: 32.8 g/dL (ref 32.0–36.0)
MCV: 86.9 fL (ref 80.0–100.0)
MPV: 10.2 fL (ref 7.5–12.5)
Monocytes Relative: 5.4 %
Neutro Abs: 7636 cells/uL (ref 1500–7800)
Neutrophils Relative %: 70.7 %
Platelets: 441 10*3/uL — ABNORMAL HIGH (ref 140–400)
RBC: 5.05 10*6/uL (ref 3.80–5.10)
RDW: 12.5 % (ref 11.0–15.0)
Total Lymphocyte: 22.1 %
WBC: 10.8 10*3/uL (ref 3.8–10.8)

## 2019-05-17 LAB — IRON,TIBC AND FERRITIN PANEL
%SAT: 35 % (calc) (ref 16–45)
Ferritin: 24 ng/mL (ref 16–288)
Iron: 131 ug/dL (ref 45–160)
TIBC: 379 mcg/dL (calc) (ref 250–450)

## 2019-05-17 LAB — HEMOGLOBIN A1C
Hgb A1c MFr Bld: 8 % of total Hgb — ABNORMAL HIGH (ref <5.7)
Mean Plasma Glucose: 183 (calc)
eAG (mmol/L): 10.1 (calc)

## 2019-05-17 LAB — VITAMIN D 25 HYDROXY (VIT D DEFICIENCY, FRACTURES): Vit D, 25-Hydroxy: 41 ng/mL (ref 30–100)

## 2019-05-17 LAB — MAGNESIUM: Magnesium: 2.1 mg/dL (ref 1.5–2.5)

## 2019-05-17 LAB — TSH: TSH: 4.32 mIU/L (ref 0.40–4.50)

## 2019-05-17 LAB — VITAMIN B12: Vitamin B-12: 397 pg/mL (ref 200–1100)

## 2019-05-18 ENCOUNTER — Other Ambulatory Visit: Payer: Self-pay | Admitting: Internal Medicine

## 2019-05-18 DIAGNOSIS — E039 Hypothyroidism, unspecified: Secondary | ICD-10-CM

## 2019-05-18 DIAGNOSIS — I10 Essential (primary) hypertension: Secondary | ICD-10-CM

## 2019-05-18 LAB — URINALYSIS, ROUTINE W REFLEX MICROSCOPIC
Bilirubin Urine: NEGATIVE
Glucose, UA: NEGATIVE
Hgb urine dipstick: NEGATIVE
Ketones, ur: NEGATIVE
Leukocytes,Ua: NEGATIVE
Nitrite: NEGATIVE
Protein, ur: NEGATIVE
Specific Gravity, Urine: 1.018 (ref 1.001–1.03)
pH: 5.5 (ref 5.0–8.0)

## 2019-05-18 LAB — MICROALBUMIN / CREATININE URINE RATIO
Creatinine, Urine: 98 mg/dL (ref 20–275)
Microalb Creat Ratio: 2 mcg/mg creat (ref <30)
Microalb, Ur: 0.2 mg/dL

## 2019-05-18 MED ORDER — AMLODIPINE BESYLATE 5 MG PO TABS: ORAL_TABLET | ORAL | 1 refills | Status: DC

## 2019-05-18 MED ORDER — LEVOTHYROXINE SODIUM 50 MCG PO TABS: ORAL_TABLET | ORAL | 1 refills | Status: DC

## 2019-05-30 NOTE — Progress Notes (Signed)
Left message to call back  

## 2019-06-28 NOTE — Progress Notes (Signed)
Left another message to call back about status of cololguard.

## 2019-09-04 NOTE — Progress Notes (Signed)
Patient ID: Elizabeth May, female   DOB: [birth year], 74 y.o.   MRN: [id number]   MEDICARE WELLNESS AND 3 MONTH FOLLOW UP  Assessment and Plan    Essential hypertension - continue medications, DASH diet, exercise and monitor at home. Call if greater than 130/80.  -     CBC with Differential/Platelet -     CMP/GFR -     TSH  RBBB with LAFB Monitor  Type 2 diabetes mellitus with stage 3 chronic kidney disease, without long-term current use of insulin (HCC) Discussed general issues about diabetes pathophysiology and management., Educational material distributed., Suggested low cholesterol diet., Encouraged aerobic exercise., Discussed foot care., Reminded to get yearly retinal exam. -     Hemoglobin A1c  Hypothyroidism, unspecified type Hypothyroidism-check TSH level, continue medications the same, reminded to take on an empty stomach 30-52mins before food.  -     TSH  Cervical radiculopathy at C6 s/p fusion, takes tramadol PRN, mainly for travel  Hyperlipidemia associated with T2DM (HCC) Continue statin for LDL goal <70 check lipids, decrease fatty foods, increase activity.  -     Lipid panel  CKD IIIa associated with T2DM (HCC) Increase fluids, avoid NSAIDS, monitor sugars, will monitor  Vitamin D deficiency Continue supplementation Check vitamin D level  Insomnia - trazodone is beneficial - good sleep hygiene discussed, increase day time activity  Medication management -     Magnesium  Colon cancer screening - patient declines a colonoscopy even though the risks and benefits were discussed at length. Colon cancer is 3rd most diagnosed cancer and 2nd leading cause of death in both men and women 8 years of age and older. Patient understands the risk of cancer and death with declining the test however they are willing to do cologuard screening instead. They understand that this is not as sensitive or specific as a colonoscopy and they are still recommended to get a  colonoscopy. The cologuard will be sent out to their house.   Declines mammogram, bone density, diabetic eye, dental exams in light of pandemic; encouraged to schedule later this year    Over 40 minutes of exam, counseling, chart review and critical decision making was performed Future Appointments  Date Time Provider Department Center  12/19/2019 10:00 AM Judd Gaudier, NP [facility] None  05/14/2020  2:00 PM Judd Gaudier, NP [facility] None    Subjective:  Elizabeth May is a 74 y.o. female who presents for medicare wellness. She has Essential hypertension; Cervical radiculopathy at C6; Type 2 diabetes mellitus (HCC); Hypothyroidism; Hyperlipidemia associated with type 2 diabetes mellitus (HCC); Vitamin D deficiency; Medication management; CKD stage 3 due to type 2 diabetes mellitus (HCC); BMI 23.0-23.9, adult; Insomnia; and RBBB with left anterior fascicular block on their problem list.   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 Spends a lot of time at [facility] head/[place] sail.   She is taking trazodone for insomnia occasionally if needed, takes when traveling.  She has hx of cervical fusion for radiculopathy at C6; she takes tramadol PRN occasionally for pain while on long trips.  BMI is Body mass index is 22.85 kg/m., she has been working on diet and exercise, eating less, walking 1 hour daily since pandemic.  Wt Readings from Last 3 Encounters:  09/06/19 129 lb (58.5 kg)  05/16/19 129 lb (58.5 kg)  12/14/18 134 lb (60.8 kg)   She has had elevated blood pressure for years. Her blood pressure has been controlled at home, today their  BP is BP: 122/78 She does workout. She denies chest pain, shortness of breath, dizziness.    She has diabetes type 2  with CKD IIIa treated With hyperlipidemia on lipitor 40 mg at goal less than 70  DEE:  Going to see Dr. Caryn Section with metformin  glipizide 10 mg TID. and denies foot ulcerations, hyperglycemia, hypoglycemia ,  increased appetite, nausea, paresthesia of the feet, polydipsia, polyuria, visual disturbances, vomiting and weight loss.  She admits hasn't been checking sugars in a month, no low sugars.  Last A1C in the office was:  Lab Results  Component Value Date   HGBA1C 8.0 (H) 05/16/2019   Lab Results  Component Value Date   CHOL 116 05/16/2019   HDL 47 (L) 05/16/2019   LDLCALC 49 05/16/2019   TRIG 122 05/16/2019   CHOLHDL 2.5 05/16/2019    She has CKD IIIa associated with T2DM monitored at this office:  Lab Results  Component Value Date   GFRNONAA 49 (L) 05/16/2019   Patient is on Vitamin D supplement.   Lab Results  Component Value Date   VD25OH 41 05/16/2019     She is on thyroid medication. Her medication was not changed last visit.   Lab Results  Component Value Date   TSH 4.32 05/16/2019       Medication Review:  Current Outpatient Medications (Endocrine & Metabolic):  .  glipiZIDE (GLUCOTROL) 10 MG tablet, TAKE 1 TABLET BY MOUTH 3  TIMES DAILY WITH MEALS .  levothyroxine (SYNTHROID) 50 MCG tablet, Take 1 tablet daily on an empty stomach with only water for 30 minutes & no Antacid meds, Calcium or Magnesium for 4 hours & avoid Biotin .  metFORMIN (GLUCOPHAGE-XR) 500 MG 24 hr tablet, Take 2 tablets 2 x /day with Meals for Diabetes  Current Outpatient Medications (Cardiovascular):  .  amLODipine (NORVASC) 5 MG tablet, Take 1 tablet Daily for BP .  atorvastatin (LIPITOR) 40 MG tablet, Take 1 tablet Daily for Cholesterol .  lisinopril-hydrochlorothiazide (ZESTORETIC) 20-25 MG tablet, Take 1 tablet Daily for BP  Current Outpatient Medications (Respiratory):  .  albuterol (PROVENTIL HFA;VENTOLIN HFA) 108 (90 Base) MCG/ACT inhaler, Inhale 2 puffs into the lungs every 6 (six) hours as needed for wheezing or shortness of breath (cough). .  fluticasone (FLONASE) 50 MCG/ACT nasal spray, Place 2 sprays into both nostrils daily. Marland Kitchen  azelastine (ASTELIN) 0.1 % nasal spray, Place 2  sprays into both nostrils 2 (two) times daily. Use in each nostril as directed  Current Outpatient Medications (Analgesics):  .  traMADol (ULTRAM) 50 MG tablet, Take 1 tab as needed up to every 6 hours for severe pain.   Current Outpatient Medications (Other):  .  Biotin 10 MG TABS, Take by mouth daily. .  Calcium Carbonate-Vitamin D (CALCIUM-VITAMIN D) 500-200 MG-UNIT per tablet, Take 1 tablet by mouth 2 (two) times daily with a meal. .  CINNAMON PO, Take 1,000 Units by mouth daily. 2 capsules daily .  Magnesium 250 MG TABS, Take 250 mg by mouth daily. .  traZODone (DESYREL) 150 MG tablet, Take 1 tablet (150 mg total) by mouth at bedtime. Take 1/2 to 1 tablet at bedtime as needed for sleep  Current Problems (verified) Patient Active Problem List   Diagnosis Date Noted  . RBBB with left anterior fascicular block 02/08/2018  . Insomnia 10/27/2017  . CKD stage 3 due to type 2 diabetes mellitus (HCC) 06/27/2017  . BMI 23.0-23.9, adult 06/27/2017  . Medication management 03/08/2014  .  Hyperlipidemia associated with type 2 diabetes mellitus ([place]) 03/21/2013  . Vitamin D deficiency 03/21/2013  . Type 2 diabetes mellitus ([place]) 03/17/2013  . Hypothyroidism   . Cervical radiculopathy at C6 10/22/2011  . Essential hypertension 07/01/2009    Screening Tests Immunization History  Administered Date(s) Administered  . Influenza Split 02/13/2013  . Influenza, High Dose Seasonal PF 12/07/2014, 12/09/2015, 12/15/2016, 02/08/2018, 12/16/2018  . Influenza-Unspecified 03/26/2014  . PFIZER SARS-COV-2 Vaccination 04/20/2019, 05/18/2019  . Pneumococcal Conjugate-13 12/09/2015  . Pneumococcal-Unspecified 03/23/2008  . Tdap 07/17/2010  . Zoster 01/14/2012  . Zoster Recombinat (Shingrix) 12/16/2018, 04/06/2019   Health Maintenance  Topic Date Due  . OPHTHALMOLOGY EXAM  12/21/2017  . Fecal DNA (Cologuard)  07/30/2018  . INFLUENZA VACCINE  10/22/2019  . HEMOGLOBIN A1C  11/13/2019  . MAMMOGRAM   05/12/2020  . FOOT EXAM  05/15/2020  . TETANUS/TDAP  07/16/2020  . DEXA SCAN  Completed  . COVID-19 Vaccine  Completed  . Hepatitis C Screening  Completed  . PNA vac Low Risk Adult  Completed    Tdap: 2012 Influenza: 11/2018 Pneumonia: 2010 Prevnar: 2017 Shingrix: 2/2 2020  Preventative care: Last colonoscopy: 2006 cologuard 12/2015 by Dr. Collene Mares, neg, DUE has at home Last mammogram: 04/2018 - deferring due to pandemic DEXA:2013 - patient declines at this time  Echo 2011 CXR 2013 Cervival neck 2013  Names of Other Physician/Practitioners you currently use: 1. [facility] here for primary care 2. Dr. Hassell Done at [facility], eye doctor, last visit 12/21/2017- GOING TO MAKE OV 3. Dr. Rebecka Apley, dentist, last visit 2019, goes q36m, declines at this time due to pandemic  4. Dr. Marland Kitchen Dermatologist, last visit 2020  Patient Care Team: Unk Pinto, MD as PCP - General (Internal Medicine) Burnell Blanks, MD as Consulting Physician (Cardiology) Juanita Craver, MD as Consulting Physician (Gastroenterology) Phylliss Bob, MD as Consulting Physician (Orthopedic Surgery) Maisie Fus, MD as Consulting Physician (Obstetrics and Gynecology)  Allergies No Known Allergies  SURGICAL HISTORY She  has a past surgical history that includes Breast surgery (80's) and Anterior cervical decomp/discectomy fusion (10/22/2011). FAMILY HISTORY Her family history includes Alzheimer's disease in her father, paternal aunt, and paternal uncle; Diabetes in her maternal aunt, maternal grandmother, and maternal uncle; Drug abuse in her maternal grandmother; Heart disease in her mother; Hyperlipidemia in her mother; Hypertension in her mother. SOCIAL HISTORY She  reports that she has never smoked. She has never used smokeless tobacco. She reports current alcohol use of about 7.0 standard drinks of alcohol per week. She reports that she does not use  drugs.   Review of Systems  Constitutional: Negative for chills, fever and malaise/fatigue.  HENT: Negative for congestion, ear pain, hearing loss, nosebleeds, sore throat and tinnitus.   Eyes: Negative for blurred vision and double vision.  Respiratory: Negative for cough, shortness of breath and wheezing.   Cardiovascular: Negative for chest pain, palpitations and leg swelling.  Gastrointestinal: Negative for blood in stool, constipation, diarrhea, heartburn and melena.  Genitourinary: Negative.   Neurological: Negative for dizziness, sensory change, loss of consciousness and headaches.  Psychiatric/Behavioral: Negative for depression. The patient has insomnia. The patient is not nervous/anxious.      Objective:     Today's Vitals   09/06/19 1157  BP: 122/78  Pulse: 82  Temp: 97.7 F (36.5 C)  SpO2: 99%  Weight: 129 lb (58.5 kg)  Height: 5\' 3"  (1.6 m)  PainSc: 0-No pain   Body mass index  is 22.85 kg/m.  General appearance: alert, no distress, WD/WN, female HEENT: normocephalic, sclerae anicteric, TMs pearly, nares patent, no discharge or erythema, pharynx normal Oral cavity: MMM, no lesions Neck: supple, no lymphadenopathy, no thyromegaly, no masses Heart: RRR, normal S1, S2, no murmurs Lungs: CTA bilaterally, no wheezes, rhonchi, or rales Abdomen: +bs, soft, non tender, non distended, no masses, no hepatomegaly, no splenomegaly Musculoskeletal: nontender, no swelling, no obvious deformity Extremities: no edema, no cyanosis, no clubbing Pulses: 2+ symmetric, upper and lower extremities, normal cap refill Neurological: alert, oriented x 3, CN2-12 intact, strength normal upper extremities and lower extremities, sensation normal throughout, DTRs 2+ throughout, no cerebellar signs, gait normal Psychiatric: normal affect, behavior normal, pleasant  Skin: Warm/dry, intact, without notable lesions, ecchymosis, rash   Quentin Mulling, PA-C   09/06/2019

## 2019-09-06 ENCOUNTER — Other Ambulatory Visit: Payer: Self-pay

## 2019-09-06 ENCOUNTER — Ambulatory Visit: Payer: Medicare PPO | Admitting: Physician Assistant

## 2019-09-06 ENCOUNTER — Encounter: Payer: Self-pay | Admitting: Physician Assistant

## 2019-09-06 VITALS — BP 122/78 | HR 82 | Temp 97.7°F | Ht 63.0 in | Wt 129.0 lb

## 2019-09-06 DIAGNOSIS — R05 Cough: Secondary | ICD-10-CM

## 2019-09-06 DIAGNOSIS — Z79899 Other long term (current) drug therapy: Secondary | ICD-10-CM | POA: Diagnosis not present

## 2019-09-06 DIAGNOSIS — Z0001 Encounter for general adult medical examination with abnormal findings: Secondary | ICD-10-CM

## 2019-09-06 DIAGNOSIS — N1831 Chronic kidney disease, stage 3a: Secondary | ICD-10-CM | POA: Diagnosis not present

## 2019-09-06 DIAGNOSIS — M5412 Radiculopathy, cervical region: Secondary | ICD-10-CM | POA: Diagnosis not present

## 2019-09-06 DIAGNOSIS — Z6823 Body mass index (BMI) 23.0-23.9, adult: Secondary | ICD-10-CM

## 2019-09-06 DIAGNOSIS — E1169 Type 2 diabetes mellitus with other specified complication: Secondary | ICD-10-CM | POA: Diagnosis not present

## 2019-09-06 DIAGNOSIS — G4700 Insomnia, unspecified: Secondary | ICD-10-CM | POA: Diagnosis not present

## 2019-09-06 DIAGNOSIS — E039 Hypothyroidism, unspecified: Secondary | ICD-10-CM | POA: Diagnosis not present

## 2019-09-06 DIAGNOSIS — E785 Hyperlipidemia, unspecified: Secondary | ICD-10-CM | POA: Diagnosis not present

## 2019-09-06 DIAGNOSIS — E1121 Type 2 diabetes mellitus with diabetic nephropathy: Secondary | ICD-10-CM | POA: Diagnosis not present

## 2019-09-06 DIAGNOSIS — R6889 Other general symptoms and signs: Secondary | ICD-10-CM

## 2019-09-06 DIAGNOSIS — N183 Chronic kidney disease, stage 3 unspecified: Secondary | ICD-10-CM | POA: Diagnosis not present

## 2019-09-06 DIAGNOSIS — R21 Rash and other nonspecific skin eruption: Secondary | ICD-10-CM

## 2019-09-06 DIAGNOSIS — E559 Vitamin D deficiency, unspecified: Secondary | ICD-10-CM

## 2019-09-06 DIAGNOSIS — E1122 Type 2 diabetes mellitus with diabetic chronic kidney disease: Secondary | ICD-10-CM

## 2019-09-06 DIAGNOSIS — I10 Essential (primary) hypertension: Secondary | ICD-10-CM | POA: Diagnosis not present

## 2019-09-06 DIAGNOSIS — I452 Bifascicular block: Secondary | ICD-10-CM

## 2019-09-06 DIAGNOSIS — R059 Cough, unspecified: Secondary | ICD-10-CM

## 2019-09-06 DIAGNOSIS — Z0000 Encounter for general adult medical examination without abnormal findings: Secondary | ICD-10-CM

## 2019-09-06 MED ORDER — PROMETHAZINE-DM 6.25-15 MG/5ML PO SYRP: 5.0000 mL | ORAL_SOLUTION | Freq: Four times a day (QID) | ORAL | 1 refills | Status: DC | PRN

## 2019-09-06 MED ORDER — DOXYCYCLINE HYCLATE 100 MG PO CAPS: ORAL_CAPSULE | ORAL | 0 refills | Status: DC

## 2019-09-06 NOTE — Patient Instructions (Addendum)
Can you get Dr. Hassell Done to send [place] papers from your eye exam  INFORMATION ABOUT YOUR XRAY [facility] Can walk into [address] building for an Insurance account manager. They will have the order and take you back. You do not any paper work, I should get the result back today or tomorrow. This order is good for a year.  Can call [phone] to schedule an appointment if you wish.   Cough may be from the lisinopril this is an ACE- let us know if you want [place] to switch this Get chest xray  RANGE OF A1C   Your A1C is a measure of your sugar over the past 3 months and is not affected by what you have eaten over the past few days. Diabetes increases your chances of stroke and heart attack over 300 % and is the leading cause of blindness and kidney failure in the [place]. Please make sure you decrease bad carbs like white bread, white rice, potatoes, corn, soft drinks, pasta, cereals, refined sugars, sweet tea, dried fruits, and fruit juice. Good carbs are okay to eat in moderation like sweet potatoes, brown rice, whole grain pasta/bread, most fruit (except dried fruit) and you can eat as many veggies as you want.   Greater than 6.5 is considered diabetic. Between 6.4 and 5.7 is prediabetic If your A1C is less than 5.7 you are NOT diabetic.  Targets for Glucose Readings: Time of Check Target for patients WITHOUT Diabetes Target for DIABETICS  Before Meals Less than 100  less than 150  Two hours after meals Less than 200  Less than 250    Please be careful with glipizide (Glucotrol).   This medication forces your blood sugar down no matter what it is starting at.  ONLY TAKE THIS MEDICATION WITH FOOD Do not take this medication if your sugar is below 150  If at any time you start to have low blood sugars in the morning or during the day please stop this medication.  Please never take this medication if you are sick or can not eat.  A low blood sugar is much more dangerous than a high blood sugar. Your  brain needs two things, sugar and oxygen.    Hypoglycemia Hypoglycemia is when the sugar (glucose) level in your blood is too low. Signs of low blood sugar may include:  Feeling: ? Hungry. ? Worried or nervous (anxious). ? Sweaty and clammy. ? Confused. ? Dizzy. ? Sleepy. ? Sick to your stomach (nauseous).  Having: ? A fast heartbeat. ? A headache. ? A change in your vision. ? Tingling or no feeling (numbness) around your mouth, lips, or tongue. ? Jerky movements that you cannot control (seizure).  Having trouble with: ? Moving (coordination). ? Sleeping. ? Passing out (fainting). ? Getting upset easily (irritability). Low blood sugar can happen to people who have diabetes and people who do not have diabetes. Low blood sugar can happen quickly, and it can be an emergency. Treating low blood sugar Low blood sugar is often treated by eating or drinking something sugary right away, such as:  Fruit juice, 4-6 oz (120-150 mL).  Regular soda (not diet soda), 4-6 oz (120-150 mL).  Low-fat milk, 4 oz (120 mL).  Several pieces of hard candy.  Sugar or honey, 1 Tbsp (15 mL). Treating low blood sugar if you have diabetes If you can think clearly and swallow safely, follow the 15:15 rule:  Take 15 grams of a fast-acting carb (carbohydrate). Talk with  your doctor about how much you should take.  Always keep a source of fast-acting carb with you, such as: ? Sugar tablets (glucose pills). Take 3-4 pills. ? 6-8 pieces of hard candy. ? 4-6 oz (120-150 mL) of fruit juice. ? 4-6 oz (120-150 mL) of regular (not diet) soda. ? 1 Tbsp (15 mL) honey or sugar.  Check your blood sugar 15 minutes after you take the carb.  If your blood sugar is still at or below 70 mg/dL (3.9 mmol/L), take 15 grams of a carb again.  If your blood sugar does not go above 70 mg/dL (3.9 mmol/L) after 3 tries, get help right away.  After your blood sugar goes back to normal, eat a meal or a snack within 1  hour.  Treating very low blood sugar If your blood sugar is at or below 54 mg/dL (3 mmol/L), you have very low blood sugar (severe hypoglycemia). This may also cause:  Passing out.  Jerky movements you cannot control (seizure).  Losing consciousness (coma). This is an emergency. Do not wait to see if the symptoms will go away. Get medical help right away. Call your local emergency services (911 in the [place] Do not drive yourself to the hospital. If you have very low blood sugar and you cannot eat or drink, you may need a glucagon shot (injection). A family member or friend should learn how to check your blood sugar and how to give you a glucagon shot. Ask your doctor if you need to have a glucagon shot kit at home. Follow these instructions at home: General instructions  Take over-the-counter and prescription medicines only as told by your doctor.  Stay aware of your blood sugar as told by your doctor.  Limit alcohol intake to no more than 1 drink a day for nonpregnant women and 2 drinks a day for men. One drink equals 12 oz of beer (355 mL), 5 oz of wine (148 mL), or 1 oz of hard liquor (44 mL).  Keep all follow-up visits as told by your doctor. This is important. If you have diabetes:   Follow your diabetes care plan as told by your doctor. Make sure you: ? Know the signs of low blood sugar. ? Take your medicines as told. ? Follow your exercise and meal plan. ? Eat on time. Do not skip meals. ? Check your blood sugar as often as told by your doctor. Always check it before and after exercise. ? Follow your sick day plan when you cannot eat or drink normally. Make this plan ahead of time with your doctor.  Share your diabetes care plan with: ? Your work or school. ? People you live with.  Check your pee (urine) for ketones: ? When you are sick. ? As told by your doctor.  Carry a card or wear jewelry that says you have diabetes. Contact a doctor if:  You have trouble  keeping your blood sugar in your target range.  You have low blood sugar often. Get help right away if:  You still have symptoms after you eat or drink something sugary.  Your blood sugar is at or below 54 mg/dL (3 mmol/L).  You have jerky movements that you cannot control.  You pass out. These symptoms may be an emergency. Do not wait to see if the symptoms will go away. Get medical help right away. Call your local emergency services (911 in the [place] Do not drive yourself to the hospital. Summary  Hypoglycemia happens  when the level of sugar (glucose) in your blood is too low.  Low blood sugar can happen to people who have diabetes and people who do not have diabetes. Low blood sugar can happen quickly, and it can be an emergency.  Make sure you know the signs of low blood sugar and know how to treat it.  Always keep a source of sugar (fast-acting carb) with you to treat low blood sugar. This information is not intended to replace advice given to you by your health care provider. Make sure you discuss any questions you have with your health care provider. Document Revised: 06/30/2018 Document Reviewed: 04/12/2015 Elsevier Patient Education  2020 [employer].

## 2019-09-07 LAB — CBC WITH DIFFERENTIAL/PLATELET
Absolute Monocytes: 505 cells/uL (ref 200–950)
Basophils Absolute: 69 cells/uL (ref 0–200)
Basophils Relative: 0.7 %
Eosinophils Absolute: 109 cells/uL (ref 15–500)
Eosinophils Relative: 1.1 %
HCT: 44 % (ref 35.0–45.0)
Hemoglobin: 14.3 g/dL (ref 11.7–15.5)
Lymphs Abs: 2386 cells/uL (ref 850–3900)
MCH: 28.5 pg (ref 27.0–33.0)
MCHC: 32.5 g/dL (ref 32.0–36.0)
MCV: 87.6 fL (ref 80.0–100.0)
MPV: 11 fL (ref 7.5–12.5)
Monocytes Relative: 5.1 %
Neutro Abs: 6831 cells/uL (ref 1500–7800)
Neutrophils Relative %: 69 %
Platelets: 347 10*3/uL (ref 140–400)
RBC: 5.02 10*6/uL (ref 3.80–5.10)
RDW: 12.5 % (ref 11.0–15.0)
Total Lymphocyte: 24.1 %
WBC: 9.9 10*3/uL (ref 3.8–10.8)

## 2019-09-07 LAB — COMPLETE METABOLIC PANEL WITH GFR
AG Ratio: 1.8 (calc) (ref 1.0–2.5)
ALT: 16 U/L (ref 6–29)
AST: 17 U/L (ref 10–35)
Albumin: 4.4 g/dL (ref 3.6–5.1)
Alkaline phosphatase (APISO): 77 U/L (ref 37–153)
BUN/Creatinine Ratio: 16 (calc) (ref 6–22)
BUN: 17 mg/dL (ref 7–25)
CO2: 28 mmol/L (ref 20–32)
Calcium: 9.5 mg/dL (ref 8.6–10.4)
Chloride: 101 mmol/L (ref 98–110)
Creat: 1.04 mg/dL — ABNORMAL HIGH (ref 0.60–0.93)
GFR, Est African American: 62 mL/min/{1.73_m2} (ref >=60)
GFR, Est Non African American: 54 mL/min/{1.73_m2} — ABNORMAL LOW (ref >=60)
Globulin: 2.5 g/dL (calc) (ref 1.9–3.7)
Glucose, Bld: 149 mg/dL — ABNORMAL HIGH (ref 65–99)
Potassium: 4.5 mmol/L (ref 3.5–5.3)
Sodium: 138 mmol/L (ref 135–146)
Total Bilirubin: 0.5 mg/dL (ref 0.2–1.2)
Total Protein: 6.9 g/dL (ref 6.1–8.1)

## 2019-09-07 LAB — LIPID PANEL
Cholesterol: 112 mg/dL (ref <200)
HDL: 51 mg/dL (ref >=50)
LDL Cholesterol (Calc): 45 mg/dL (calc)
Non-HDL Cholesterol (Calc): 61 mg/dL (calc) (ref <130)
Total CHOL/HDL Ratio: 2.2 (calc) (ref <5.0)
Triglycerides: 84 mg/dL (ref <150)

## 2019-09-07 LAB — HEMOGLOBIN A1C
Hgb A1c MFr Bld: 8 % of total Hgb — ABNORMAL HIGH (ref <5.7)
Mean Plasma Glucose: 183 (calc)
eAG (mmol/L): 10.1 (calc)

## 2019-09-07 LAB — MAGNESIUM: Magnesium: 1.9 mg/dL (ref 1.5–2.5)

## 2019-09-07 LAB — TSH: TSH: 2.16 mIU/L (ref 0.40–4.50)

## 2019-09-18 DIAGNOSIS — Z1211 Encounter for screening for malignant neoplasm of colon: Secondary | ICD-10-CM | POA: Diagnosis not present

## 2019-09-19 LAB — COLOGUARD: Cologuard: NEGATIVE

## 2019-09-28 LAB — COLOGUARD: COLOGUARD: NEGATIVE

## 2019-09-29 ENCOUNTER — Encounter: Payer: Self-pay | Admitting: Internal Medicine

## 2019-12-18 NOTE — Progress Notes (Signed)
Patient ID: Elizabeth May, female   DOB: [birth year], 74 y.o.   MRN: [id number]   3 MONTH FOLLOW UP  Assessment and Plan    Essential hypertension - continue medications, DASH diet, exercise and monitor at home. Call if greater than 130/80.  -     CBC with Differential/Platelet -     CMP/GFR -     TSH  Type 2 diabetes mellitus with stage 3 chronic kidney disease, without long-term current use of insulin (HCC) Discussed general issues about diabetes pathophysiology and management., Educational material distributed., Suggested low cholesterol diet., Encouraged aerobic exercise., Discussed foot care., Reminded to get yearly retinal exam - overdue -  -     Hemoglobin A1c  Hypothyroidism, unspecified type Hypothyroidism-check TSH level, continue medications the same, reminded to take on an empty stomach 30-56mins before food.  -     TSH  Hyperlipidemia associated with T2DM (HCC) Continue statin for LDL goal <70 check lipids, decrease fatty foods, increase activity.  -     Lipid panel  CKD IIIa associated with T2DM (HCC) Increase fluids, avoid NSAIDS, monitor sugars, will monitor  Vitamin D deficiency Continue supplementation Check vitamin D level  Insomnia - trazodone is beneficial - good sleep hygiene discussed, increase day time activity  Medication management -     Magnesium   Over 30 minutes of exam, counseling, chart review and critical decision making was performed Future Appointments  Date Time Provider Department Center  05/14/2020  2:00 PM Judd Gaudier, NP [facility] None  09/16/2020 10:30 AM Judd Gaudier, NP [facility] None    Subjective:  Elizabeth May is a 74 y.o. female who presents for 3 month follow up on diabetes with CKD, htn/hld, hypothyroid, vitamin D def.   Spends a lot of time at [facility] [place], has timeshare.   She is taking trazodone for insomnia occasionally if needed, takes when traveling.  She has hx of cervical fusion for  radiculopathy at C6; she takes tramadol PRN occasionally for pain while on long trips.  BMI is Body mass index is 23.21 kg/m., she has been working on diet and exercise, eating less, walking 1-1.5 hour daily since pandemic.  Wt Readings from Last 3 Encounters:  12/19/19 131 lb (59.4 kg)  09/06/19 129 lb (58.5 kg)  05/16/19 129 lb (58.5 kg)   She has had elevated blood pressure for years. Her blood pressure has been controlled at home, today their BP is BP: 118/74 She does workout. She denies chest pain, shortness of breath, dizziness.    She has diabetes type 2  with CKD IIIa on lisinopril With hyperlipidemia on lipitor 40 mg at goal less than 70  DEE:  Going to see Dr. Caryn Section, overdue, needs to schedule  with metformin 1000 mg BID glipizide 10 mg TID with each meal and denies foot ulcerations, hyperglycemia, hypoglycemia , increased appetite, nausea, paresthesia of the feet, polydipsia, polyuria, visual disturbances, vomiting and weight loss.  She checks sugars occasionally, fasting has ranged 120-160.  Last A1C in the office was:  Lab Results  Component Value Date   HGBA1C 8.0 (H) 09/06/2019   Lab Results  Component Value Date   CHOL 112 09/06/2019   HDL 51 09/06/2019   LDLCALC 45 09/06/2019   TRIG 84 09/06/2019   CHOLHDL 2.2 09/06/2019   She has CKD IIIa associated with T2DM monitored at this office, on lisinopril:  Lab Results  Component Value Date   GFRNONAA 54 (L) 09/06/2019   Patient is  on Vitamin D supplement, hasn't changed dose Lab Results  Component Value Date   VD25OH 63 05/16/2019     She is on thyroid medication. Her medication was not changed last visit.   Lab Results  Component Value Date   TSH 2.16 09/06/2019       Medication Review:  Current Outpatient Medications (Endocrine & Metabolic):    glipiZIDE (GLUCOTROL) 10 MG tablet, TAKE 1 TABLET BY MOUTH 3  TIMES DAILY WITH MEALS   levothyroxine (SYNTHROID) 50 MCG tablet, Take 1 tablet daily on an  empty stomach with only water for 30 minutes & no Antacid meds, Calcium or Magnesium for 4 hours & avoid Biotin   metFORMIN (GLUCOPHAGE-XR) 500 MG 24 hr tablet, Take 2 tablets 2 x /day with Meals for Diabetes  Current Outpatient Medications (Cardiovascular):    amLODipine (NORVASC) 5 MG tablet, Take 1 tablet Daily for BP   atorvastatin (LIPITOR) 40 MG tablet, Take 1 tablet Daily for Cholesterol   lisinopril-hydrochlorothiazide (ZESTORETIC) 20-25 MG tablet, Take 1 tablet Daily for BP  Current Outpatient Medications (Respiratory):    albuterol (PROVENTIL HFA;VENTOLIN HFA) 108 (90 Base) MCG/ACT inhaler, Inhale 2 puffs into the lungs every 6 (six) hours as needed for wheezing or shortness of breath (cough).   fluticasone (FLONASE) 50 MCG/ACT nasal spray, Place 2 sprays into both nostrils daily.   promethazine-dextromethorphan (PROMETHAZINE-DM) 6.25-15 MG/5ML syrup, Take 5 mLs by mouth 4 (four) times daily as needed for cough.   azelastine (ASTELIN) 0.1 % nasal spray, Place 2 sprays into both nostrils 2 (two) times daily. Use in each nostril as directed  Current Outpatient Medications (Analgesics):    traMADol (ULTRAM) 50 MG tablet, Take 1 tab as needed up to every 6 hours for severe pain.   Current Outpatient Medications (Other):    Biotin 10 MG TABS, Take by mouth daily.   Calcium Carbonate-Vitamin D (CALCIUM-VITAMIN D) 500-200 MG-UNIT per tablet, Take 1 tablet by mouth 2 (two) times daily with a meal.   CINNAMON PO, Take 1,000 Units by mouth daily. 2 capsules daily   Magnesium 250 MG TABS, Take 250 mg by mouth daily.   traZODone (DESYREL) 150 MG tablet, Take 1 tablet (150 mg total) by mouth at bedtime. Take 1/2 to 1 tablet at bedtime as needed for sleep   doxycycline (VIBRAMYCIN) 100 MG capsule, Take 1 capsule twice daily with food (Patient not taking: Reported on 12/19/2019)  Current Problems (verified) Patient Active Problem List   Diagnosis Date Noted   RBBB with left  anterior fascicular block 02/08/2018   Insomnia 10/27/2017   CKD stage 3 due to type 2 diabetes mellitus (HCC) 06/27/2017   BMI 23.0-23.9, adult 06/27/2017   Medication management 03/08/2014   Hyperlipidemia associated with type 2 diabetes mellitus (HCC) 03/21/2013   Vitamin D deficiency 03/21/2013   Type 2 diabetes mellitus (HCC) 03/17/2013   Hypothyroidism    Cervical radiculopathy at C6 10/22/2011   Essential hypertension 07/01/2009    Allergies No Known Allergies  SURGICAL HISTORY She  has a past surgical history that includes Breast surgery (80's) and Anterior cervical decomp/discectomy fusion (10/22/2011). FAMILY HISTORY Her family history includes Alzheimer's disease in her father, paternal aunt, and paternal uncle; Diabetes in her maternal aunt, maternal grandmother, and maternal uncle; Drug abuse in her maternal grandmother; Heart disease in her mother; Hyperlipidemia in her mother; Hypertension in her mother. SOCIAL HISTORY She  reports that she has never smoked. She has never used smokeless tobacco. She reports current alcohol use  of about 7.0 standard drinks of alcohol per week. She reports that she does not use drugs.   Review of Systems  Constitutional: Negative for chills, fever and malaise/fatigue.  HENT: Negative for congestion, ear pain, hearing loss, nosebleeds, sore throat and tinnitus.   Eyes: Negative for blurred vision and double vision.  Respiratory: Negative for cough, shortness of breath and wheezing.   Cardiovascular: Negative for chest pain, palpitations and leg swelling.  Gastrointestinal: Negative for blood in stool, constipation, diarrhea, heartburn and melena.  Genitourinary: Negative.   Neurological: Negative for dizziness, sensory change, loss of consciousness and headaches.  Psychiatric/Behavioral: Negative for depression. The patient has insomnia. The patient is not nervous/anxious.      Objective:     Today's Vitals   12/19/19 1005   BP: 118/74  Pulse: 67  Temp: (!) 97.5 F (36.4 C)  SpO2: 99%  Weight: 131 lb (59.4 kg)   Body mass index is 23.21 kg/m.  General appearance: alert, no distress, WD/WN, female HEENT: normocephalic, sclerae anicteric, TMs pearly, nares patent, no discharge or erythema, pharynx normal Oral cavity: MMM, no lesions Neck: supple, no lymphadenopathy, no thyromegaly, no masses Heart: RRR, normal S1, S2, no murmurs Lungs: CTA bilaterally, no wheezes, rhonchi, or rales Abdomen: +bs, soft, non tender, non distended, no masses, no hepatomegaly, no splenomegaly Musculoskeletal: nontender, no swelling, no obvious deformity Extremities: no edema, no cyanosis, no clubbing Pulses: 2+ symmetric, upper and lower extremities, normal cap refill Neurological: alert, oriented x 3, CN2-12 intact, strength normal upper extremities and lower extremities, sensation normal throughout, DTRs 2+ throughout, no cerebellar signs, gait normal Psychiatric: normal affect, behavior normal, pleasant  Skin: Warm/dry, intact, without notable lesions, ecchymosis, rash   Dan Maker, NP   12/19/2019

## 2019-12-19 ENCOUNTER — Encounter: Payer: Self-pay | Admitting: Adult Health

## 2019-12-19 ENCOUNTER — Other Ambulatory Visit: Payer: Self-pay

## 2019-12-19 ENCOUNTER — Ambulatory Visit: Payer: Medicare Other | Admitting: Adult Health

## 2019-12-19 VITALS — BP 118/74 | HR 67 | Temp 97.5°F | Wt 131.0 lb

## 2019-12-19 DIAGNOSIS — E1121 Type 2 diabetes mellitus with diabetic nephropathy: Secondary | ICD-10-CM | POA: Diagnosis not present

## 2019-12-19 DIAGNOSIS — I10 Essential (primary) hypertension: Secondary | ICD-10-CM | POA: Diagnosis not present

## 2019-12-19 DIAGNOSIS — E559 Vitamin D deficiency, unspecified: Secondary | ICD-10-CM

## 2019-12-19 DIAGNOSIS — E1122 Type 2 diabetes mellitus with diabetic chronic kidney disease: Secondary | ICD-10-CM | POA: Diagnosis not present

## 2019-12-19 DIAGNOSIS — N183 Chronic kidney disease, stage 3 unspecified: Secondary | ICD-10-CM

## 2019-12-19 DIAGNOSIS — Z79899 Other long term (current) drug therapy: Secondary | ICD-10-CM | POA: Diagnosis not present

## 2019-12-19 DIAGNOSIS — E785 Hyperlipidemia, unspecified: Secondary | ICD-10-CM

## 2019-12-19 DIAGNOSIS — E1169 Type 2 diabetes mellitus with other specified complication: Secondary | ICD-10-CM

## 2019-12-19 DIAGNOSIS — E039 Hypothyroidism, unspecified: Secondary | ICD-10-CM | POA: Diagnosis not present

## 2019-12-19 DIAGNOSIS — Z6823 Body mass index (BMI) 23.0-23.9, adult: Secondary | ICD-10-CM

## 2019-12-19 DIAGNOSIS — G4700 Insomnia, unspecified: Secondary | ICD-10-CM | POA: Diagnosis not present

## 2019-12-19 DIAGNOSIS — E782 Mixed hyperlipidemia: Secondary | ICD-10-CM

## 2019-12-19 DIAGNOSIS — N1831 Chronic kidney disease, stage 3a: Secondary | ICD-10-CM

## 2019-12-19 MED ORDER — GLIPIZIDE 10 MG PO TABS: ORAL_TABLET | ORAL | 3 refills | Status: DC

## 2019-12-19 MED ORDER — LISINOPRIL-HYDROCHLOROTHIAZIDE 20-25 MG PO TABS: ORAL_TABLET | ORAL | 3 refills | Status: DC

## 2019-12-19 MED ORDER — AMLODIPINE BESYLATE 5 MG PO TABS: ORAL_TABLET | ORAL | 3 refills | Status: DC

## 2019-12-19 MED ORDER — ATORVASTATIN CALCIUM 40 MG PO TABS: ORAL_TABLET | ORAL | 3 refills | Status: DC

## 2019-12-19 MED ORDER — METFORMIN HCL ER 500 MG PO TB24: ORAL_TABLET | ORAL | 3 refills | Status: DC

## 2019-12-19 MED ORDER — LEVOTHYROXINE SODIUM 50 MCG PO TABS: ORAL_TABLET | ORAL | 1 refills | Status: DC

## 2019-12-19 NOTE — Patient Instructions (Signed)
Goals    . HEMOGLOBIN A1C < 7    . LDL CALC < 70         Please schedule diabetic eye exam ASAP - have them forward [place] the report letting us know if there was retinopathy or not      Diabetic Retinopathy Diabetic retinopathy is a disease of the retina. The retina is a light-sensitive membrane at the back of the eye. Retinopathy is a complication of diabetes (diabetes mellitus) and a common cause of bad eyesight (visual impairment). It can eventually cause blindness. Early detection and treatment of diabetic retinopathy is important in keeping your eyes healthy and preventing further damage to them. What are the causes? Diabetic retinopathy is caused by blood sugar (glucose) levels that are too high for an extended period of time. High blood glucose over an extended period of time can:  Damage small blood vessels in the retina, allowing blood to leak through the vessel walls.  Cause new, abnormal blood vessels to grow on the retina. This can scar the retina in the advanced stage of diabetic retinopathy. What increases the risk? You are more likely to develop this condition if:  You have had diabetes for a long time.  You have poorly controlled blood glucose.  You have high blood pressure. What are the signs or symptoms? In the early stages of diabetic retinopathy, there are often no symptoms. As the condition gets worse, symptoms may include:  Blurred vision. This is usually caused by swelling due to abnormal blood glucose levels. The blurriness may go away when blood glucose levels return to normal.  Moving specks or dark spots (floaters) in your vision. These can be caused by a small amount of bleeding (hemorrhage) from retinal blood vessels.  Missing parts of your field of vision, such as vision at the sides of the eyes. This can be caused by larger retinal hemorrhages.  Difficulty reading.  Double vision.  Pain in one or both eyes.  Feeling pressure in one or both  eyes.  Trouble seeing straight lines. Straight lines may not look straight.  Redness of the eyes that does not go away. How is this diagnosed? This condition may be diagnosed with an eye exam in which your eye care specialist puts drops in your eyes that enlarge (dilate) your pupils. This lets your health care provider examine your retina and check for changes in your retinal blood vessels. How is this treated? This condition may be treated by:  Keeping your blood glucose and blood pressure within a target range.  Using a type of laser beam to seal your retinal blood vessels. This stops them from bleeding and decreases pressure in your eye.  Getting shots of medicine in the eye to reduce swelling of the center of the retina (macula). You may be given: ? Anti-VEGF medicine. This medicine can help slow vision loss, and may even improve vision. ? Steroid medicine. Follow these instructions at home:   Follow your diabetes management plan as directed by your health care provider. This may include exercising regularly and eating a healthy diet.  Keep your blood glucose level and your blood pressure in your target range, as directed by your health care provider.  Check your blood glucose as often as directed.  Take over the counter and prescription medicines only as told by your health care provider. This includes insulin and oral diabetes medicine.  Get your eyes checked at least once every year. An eye specialist can usually see  diabetic retinopathy developing long before it starts to cause problems. In many cases, it can be treated to prevent complications from occurring.  Do not use any products that contain nicotine or tobacco, such as cigarettes and e-cigarettes. If you need help quitting, ask your health care provider.  Keep all follow-up visits as told by your health care provider. This is important. Contact a health care provider if:  You notice gradual blurring or other changes  in your vision over time.  You notice that your glasses or contact lenses do not make things look as sharp as they once did.  You have trouble reading or seeing details at a distance with either eye.  You notice a change in your vision or notice that parts of your field of vision appear missing or hazy.  You suddenly see moving specks or dark spots in the field of vision of either eye. Get help right away if:  You have sudden pain or pressure in one or both eyes.  You suddenly lose vision or a curtain or veil seems to come across your eyes.  You have a sudden burst of floaters in your vision. Summary  Diabetic retinopathy is a disease of the retina. The retina is a light-sensitive membrane at the back of the eye. Retinopathy is a complication of diabetes.  Get your eyes checked at least once every year. An eye specialist can usually see diabetic retinopathy developing long before it starts to cause problems. In many cases, it can be treated to prevent complications from occurring.  Keep your blood glucose and your blood pressure in target range. Follow your diabetes management plan as directed by your health care provider.  Protect your eyes. Wear sunglasses and eye protection when needed. This information is not intended to replace advice given to you by your health care provider. Make sure you discuss any questions you have with your health care provider. Document Revised: 04/21/2017 Document Reviewed: 04/13/2016 Elsevier Patient Education  2020 [employer].

## 2019-12-20 ENCOUNTER — Other Ambulatory Visit: Payer: Self-pay | Admitting: Adult Health

## 2019-12-20 DIAGNOSIS — E039 Hypothyroidism, unspecified: Secondary | ICD-10-CM

## 2019-12-20 LAB — VITAMIN D 25 HYDROXY (VIT D DEFICIENCY, FRACTURES): Vit D, 25-Hydroxy: 47 ng/mL (ref 30–100)

## 2019-12-20 LAB — LIPID PANEL
Cholesterol: 130 mg/dL (ref <200)
HDL: 53 mg/dL (ref >=50)
LDL Cholesterol (Calc): 58 mg/dL (calc)
Non-HDL Cholesterol (Calc): 77 mg/dL (calc) (ref <130)
Total CHOL/HDL Ratio: 2.5 (calc) (ref <5.0)
Triglycerides: 108 mg/dL (ref <150)

## 2019-12-20 LAB — COMPLETE METABOLIC PANEL WITH GFR
AG Ratio: 1.6 (calc) (ref 1.0–2.5)
ALT: 15 U/L (ref 6–29)
AST: 19 U/L (ref 10–35)
Albumin: 4.3 g/dL (ref 3.6–5.1)
Alkaline phosphatase (APISO): 86 U/L (ref 37–153)
BUN/Creatinine Ratio: 16 (calc) (ref 6–22)
BUN: 17 mg/dL (ref 7–25)
CO2: 27 mmol/L (ref 20–32)
Calcium: 10.2 mg/dL (ref 8.6–10.4)
Chloride: 101 mmol/L (ref 98–110)
Creat: 1.04 mg/dL — ABNORMAL HIGH (ref 0.60–0.93)
GFR, Est African American: 62 mL/min/{1.73_m2} (ref >=60)
GFR, Est Non African American: 54 mL/min/{1.73_m2} — ABNORMAL LOW (ref >=60)
Globulin: 2.7 g/dL (calc) (ref 1.9–3.7)
Glucose, Bld: 157 mg/dL — ABNORMAL HIGH (ref 65–99)
Potassium: 5.1 mmol/L (ref 3.5–5.3)
Sodium: 138 mmol/L (ref 135–146)
Total Bilirubin: 0.6 mg/dL (ref 0.2–1.2)
Total Protein: 7 g/dL (ref 6.1–8.1)

## 2019-12-20 LAB — CBC WITH DIFFERENTIAL/PLATELET
Absolute Monocytes: 505 cells/uL (ref 200–950)
Basophils Absolute: 79 cells/uL (ref 0–200)
Basophils Relative: 0.8 %
Eosinophils Absolute: 198 cells/uL (ref 15–500)
Eosinophils Relative: 2 %
HCT: 46.2 % — ABNORMAL HIGH (ref 35.0–45.0)
Hemoglobin: 15.1 g/dL (ref 11.7–15.5)
Lymphs Abs: 2188 cells/uL (ref 850–3900)
MCH: 28.4 pg (ref 27.0–33.0)
MCHC: 32.7 g/dL (ref 32.0–36.0)
MCV: 86.8 fL (ref 80.0–100.0)
MPV: 10.8 fL (ref 7.5–12.5)
Monocytes Relative: 5.1 %
Neutro Abs: 6930 cells/uL (ref 1500–7800)
Neutrophils Relative %: 70 %
Platelets: 414 10*3/uL — ABNORMAL HIGH (ref 140–400)
RBC: 5.32 10*6/uL — ABNORMAL HIGH (ref 3.80–5.10)
RDW: 12.7 % (ref 11.0–15.0)
Total Lymphocyte: 22.1 %
WBC: 9.9 10*3/uL (ref 3.8–10.8)

## 2019-12-20 LAB — HEMOGLOBIN A1C
Hgb A1c MFr Bld: 8.8 % of total Hgb — ABNORMAL HIGH (ref <5.7)
Mean Plasma Glucose: 206 (calc)
eAG (mmol/L): 11.4 (calc)

## 2019-12-20 LAB — MAGNESIUM: Magnesium: 2 mg/dL (ref 1.5–2.5)

## 2019-12-20 LAB — TSH: TSH: 3.39 mIU/L (ref 0.40–4.50)

## 2019-12-20 MED ORDER — LEVOTHYROXINE SODIUM 50 MCG PO TABS: ORAL_TABLET | ORAL | 3 refills | Status: DC

## 2019-12-21 ENCOUNTER — Other Ambulatory Visit: Payer: Self-pay | Admitting: Internal Medicine

## 2019-12-21 DIAGNOSIS — E039 Hypothyroidism, unspecified: Secondary | ICD-10-CM

## 2019-12-21 MED ORDER — LEVOTHYROXINE SODIUM 50 MCG PO TABS: ORAL_TABLET | ORAL | 0 refills | Status: DC

## 2019-12-25 DIAGNOSIS — L309 Dermatitis, unspecified: Secondary | ICD-10-CM | POA: Diagnosis not present

## 2019-12-25 DIAGNOSIS — L281 Prurigo nodularis: Secondary | ICD-10-CM | POA: Diagnosis not present

## 2020-01-31 ENCOUNTER — Telehealth: Payer: Medicare PPO | Admitting: *Deleted

## 2020-01-31 NOTE — Telephone Encounter (Signed)
After numerous messages left to return my call regarding reason for Doxycycline refill, there has been no return call from patient. Voice mail messages left on 01/18/2020, 01/22/2020, 01/23/2020, 01/25/2020 and today.

## 2020-02-23 DIAGNOSIS — Z23 Encounter for immunization: Secondary | ICD-10-CM | POA: Diagnosis not present

## 2020-03-27 ENCOUNTER — Encounter: Payer: Medicare Other | Admitting: Adult Health

## 2020-03-29 ENCOUNTER — Other Ambulatory Visit: Payer: Self-pay

## 2020-03-29 DIAGNOSIS — E782 Mixed hyperlipidemia: Secondary | ICD-10-CM

## 2020-03-29 MED ORDER — ATORVASTATIN CALCIUM 40 MG PO TABS: ORAL_TABLET | ORAL | 0 refills | Status: DC

## 2020-04-10 ENCOUNTER — Other Ambulatory Visit: Payer: Self-pay | Admitting: Internal Medicine

## 2020-04-10 DIAGNOSIS — E039 Hypothyroidism, unspecified: Secondary | ICD-10-CM

## 2020-05-13 NOTE — Progress Notes (Signed)
Patient ID: Elizabeth May, female   DOB: 1946-03-12, 75 y.o.   MRN: [id number]   MEDICARE WELLNESS AND 3 MONTH FOLLOW UP  Assessment and Plan    Encounter for annual wellness exam Due annually   Essential hypertension - continue medications, DASH diet, exercise and monitor at home. Call if greater than 130/80.  -     CBC with Differential/Platelet -     CMP/GFR -     TSH -     UA -     Microalbumin  RBBB  Stable on EKG, no concerning sx, monitor  - EKG  Type 2 diabetes mellitus with stage 3 chronic kidney disease, without long-term current use of insulin (HCC) Discussed general issues about diabetes pathophysiology and management., Educational material distributed., Suggested low cholesterol diet., Encouraged aerobic exercise., Discussed foot care., Reminded to get yearly retinal exam - OVERDUE, forward -     Hemoglobin A1c -     Foot exam completed  Hypothyroidism, unspecified type Hypothyroidism-check TSH level, continue medications the same, reminded to take on an empty stomach 30-72mins before food.  -     TSH  Cervical radiculopathy at C6 s/p fusion, takes tramadol PRN, mainly for travel  Hyperlipidemia associated with T2DM (HCC) Continue statin for LDL goal <70 check lipids, decrease fatty foods, increase activity.  -     Lipid panel  CKD IIIa associated with T2DM (HCC) Increase fluids, avoid NSAIDS, monitor sugars, will monitor      -      CMP/GFR      -      UA, microalbumin  Vitamin D deficiency Continue supplementation Check vitamin D level  Insomnia - trazodone is beneficial - good sleep hygiene discussed, increase day time activity  Medication management -     Magnesium  Encounter for screening mammogram for malignant neoplasm of breast -     MM Digital Screening; Future  Estrogen deficiency -     DG Bone Density; Future  Finger pain, right With limited flexion, r/o joint fracture; alternatively tendonitis Try voltaren or aspercreme 3-4  times daily, try icing Plan hand specialist referral if not improving -     DG Hand Complete Right; Future  Dental infection Complete script, follow up with dental surgeon as scheduled in March or sooner if getting worse -     amoxicillin-clavulanate (AUGMENTIN) 875-125 MG tablet; Take 1 tablet by mouth 2 (two) times daily for 10 days.   Orders Placed This Encounter  Procedures  . MM Digital Screening  . DG Bone Density  . DG Hand Complete Right  . CBC with Differential/Platelet  . COMPLETE METABOLIC PANEL WITH GFR  . Magnesium  . Lipid panel  . TSH  . Hemoglobin A1c  . VITAMIN D 25 Hydroxy (Vit-D Deficiency, Fractures)  . Vitamin B12  . Microalbumin / creatinine urine ratio  . Urinalysis, Routine w reflex microscopic  . EKG 12-Lead  . HM DIABETES FOOT EXAM    Over 40 minutes of exam, counseling, chart review and critical decision making was performed Future Appointments  Date Time Provider Department Center  09/16/2020 10:30 AM Judd Gaudier, NP [facility] None  12/18/2020 10:30 AM [name], MD [facility] None  05/14/2021  2:00 PM Judd Gaudier, NP [facility] None    Subjective:  Elizabeth May is a 75 y.o. female who presents for CPE. She has Essential hypertension; Cervical radiculopathy at C6; Type 2 diabetes mellitus (HCC); Hypothyroidism; Hyperlipidemia associated with type 2 diabetes mellitus (HCC); Vitamin  D deficiency; Medication management; CKD stage 3 due to type 2 diabetes mellitus (HCC); BMI 22.0-22.9, adult; Insomnia; RBBB; and Finger pain, right on their problem list.   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 Spends a lot of time at [place], about to travel to [place]. [place].   She reports recently had several lower teeth pulled for partial by Dr. Ellery Plunk (oral surgeon in [place]), concerned about possible purulent discharge in the last 4-5 days with foul taste in mouth, feels this is getting worse.   She is taking  trazodone for insomnia occasionally if needed, takes when traveling.  She has hx of cervical fusion for radiculopathy at C6; she takes tramadol PRN rarely for pain while on long trips.   She reports injured R hand 01/2020, has noted pain with flexion and will struggle to extend 4th digit, has been bracing, but feels this is getting worse. Uses tylenol occasionally, heat, bracing. Occasionally notes sense of catching, bony grinding.   BMI is Body mass index is 22.32 kg/m., she has been working on diet and exercise, eating less, walking 1-1.5 hours daily if weather is good. She drinks glass of wine most evenings. Admits needs to increase water intake could be better. Generally eats healthy, minimal starches.  Wt Readings from Last 3 Encounters:  05/14/20 128 lb (58.1 kg)  12/19/19 131 lb (59.4 kg)  09/06/19 129 lb (58.5 kg)   She has had elevated blood pressure for years. Her blood pressure has been controlled at home, today their BP is BP: 132/72 She does workout. She denies chest pain, shortness of breath, dizziness.    She has diabetes type 2  with CKD IIIa treated With hyperlipidemia on lipitor 40 mg at goal less than 70  DEE:  Going to see Dr. Caryn Section, overdue, needs to schedule with metformin 2000 mg daily glipizide 10 mg TID and denies foot ulcerations, hyperglycemia, hypoglycemia , increased appetite, nausea, paresthesia of the feet, polydipsia, polyuria, visual disturbances, vomiting and weight loss.  She admits hasn't been checking sugars regularly, last week 128-130, no low sugars.  Last A1C in the office was:  Lab Results  Component Value Date   HGBA1C 8.8 (H) 12/19/2019   Lab Results  Component Value Date   CHOL 130 12/19/2019   HDL 53 12/19/2019   LDLCALC 58 12/19/2019   TRIG 108 12/19/2019   CHOLHDL 2.5 12/19/2019   She has CKD IIIa associated with T2DM monitored at this office:  Lab Results  Component Value Date   GFRNONAA 54 (L) 12/19/2019   Patient is on Vitamin D  supplement, takes 200 mg BID with calcium.    Lab Results  Component Value Date   VD25OH 47 12/19/2019     She is on thyroid medication. Her medication was not changed last visit. Taking 50 mcg daily.  Lab Results  Component Value Date   TSH 3.39 12/19/2019   She is not currenlty on B12 supplement  Lab Results  Component Value Date   VITAMINB12 397 05/16/2019      Medication Review:  Current Outpatient Medications (Endocrine & Metabolic):  .  glipiZIDE (GLUCOTROL) 10 MG tablet, TAKE 1 TABLET BY MOUTH 3  TIMES DAILY WITH MEALS .  levothyroxine (SYNTHROID) 50 MCG tablet, TAKE 1 TABLET DAILY ON AN EMPTY STOMACH WITH ONLY WATER FOR 30 MINUTES .  metFORMIN (GLUCOPHAGE-XR) 500 MG 24 hr tablet, Take 2 tablets 2 x /day with Meals for Diabetes  Current Outpatient Medications (Cardiovascular):  .  amLODipine (NORVASC) 5 MG tablet, Take 1 tablet Daily for BP .  atorvastatin (LIPITOR) 40 MG tablet, Take 1 tablet Daily for Cholesterol .  lisinopril-hydrochlorothiazide (ZESTORETIC) 20-25 MG tablet, Take 1/2-1 tablet Daily for BP  Current Outpatient Medications (Respiratory):  .  albuterol (PROVENTIL HFA;VENTOLIN HFA) 108 (90 Base) MCG/ACT inhaler, Inhale 2 puffs into the lungs every 6 (six) hours as needed for wheezing or shortness of breath (cough). .  fluticasone (FLONASE) 50 MCG/ACT nasal spray, Place 2 sprays into both nostrils daily. Marland Kitchen  azelastine (ASTELIN) 0.1 % nasal spray, Place 2 sprays into both nostrils 2 (two) times daily. Use in each nostril as directed .  promethazine-dextromethorphan (PROMETHAZINE-DM) 6.25-15 MG/5ML syrup, Take 5 mLs by mouth 4 (four) times daily as needed for cough. (Patient not taking: Reported on 05/14/2020)  Current Outpatient Medications (Analgesics):  .  traMADol (ULTRAM) 50 MG tablet, Take 1 tab as needed up to every 6 hours for severe pain.   Current Outpatient Medications (Other):  .  amoxicillin-clavulanate (AUGMENTIN) 875-125 MG tablet, Take 1  tablet by mouth 2 (two) times daily for 10 days. .  Biotin 10 MG TABS, Take by mouth daily. .  Calcium Carbonate-Vitamin D (CALCIUM-VITAMIN D) 500-200 MG-UNIT per tablet, Take 1 tablet by mouth 2 (two) times daily with a meal. .  CINNAMON PO, Take 1,000 Units by mouth daily. 2 capsules daily .  Magnesium 250 MG TABS, Take 250 mg by mouth daily. .  traZODone (DESYREL) 150 MG tablet, Take 1 tablet (150 mg total) by mouth at bedtime. Take 1/2 to 1 tablet at bedtime as needed for sleep .  doxycycline (VIBRAMYCIN) 100 MG capsule, Take 1 capsule twice daily with food (Patient not taking: No sig reported)  Current Problems (verified) Patient Active Problem List   Diagnosis Date Noted  . Finger pain, right 05/14/2020  . RBBB 02/08/2018  . Insomnia 10/27/2017  . CKD stage 3 due to type 2 diabetes mellitus (HCC) 06/27/2017  . BMI 22.0-22.9, adult 06/27/2017  . Medication management 03/08/2014  . Hyperlipidemia associated with type 2 diabetes mellitus (HCC) 03/21/2013  . Vitamin D deficiency 03/21/2013  . Type 2 diabetes mellitus (HCC) 03/17/2013  . Hypothyroidism   . Cervical radiculopathy at C6 10/22/2011  . Essential hypertension 07/01/2009    Screening Tests Immunization History  Administered Date(s) Administered  . Influenza Split 02/13/2013  . Influenza, High Dose Seasonal PF 12/07/2014, 12/09/2015, 12/15/2016, 02/08/2018, 12/16/2018  . Influenza-Unspecified 03/26/2014  . PFIZER(Purple Top)SARS-COV-2 Vaccination 04/20/2019, 05/18/2019  . Pneumococcal Conjugate-13 12/09/2015  . Pneumococcal-Unspecified 03/23/2008  . Tdap 07/17/2010  . Zoster 01/14/2012  . Zoster Recombinat (Shingrix) 12/16/2018, 04/06/2019   Health Maintenance  Topic Date Due  . OPHTHALMOLOGY EXAM  12/21/2017  . INFLUENZA VACCINE  10/22/2019  . COVID-19 Vaccine (3 - Booster for Pfizer series) 11/15/2019  . MAMMOGRAM  05/12/2020  . HEMOGLOBIN A1C  06/17/2020  . TETANUS/TDAP  07/16/2020  . FOOT EXAM  05/14/2021   . Fecal DNA (Cologuard)  09/19/2022  . DEXA SCAN  Completed  . Hepatitis C Screening  Completed  . PNA vac Low Risk Adult  Completed    Tdap: 06/2010 - get next visit  Influenza: 10/2019 Pneumonia: 2010 Prevnar: 2017 Shingrix: 2/2 2020 Covid 19: 3/3, 2021, pfizer - requested vaccine dates  Preventative care: Last colonoscopy: 2006 cologuard 08/2019 Last mammogram: 04/2018 - overdue, ordered to schedule at breast center  DEXA:2013 - ordered to schedule with mammogram  Echo 2011 CXR 2013 Cervival neck 2013  Names  of Other Physician/Practitioners you currently use: 1. [facility] here for primary care 2. [facility], eye doctor, last visit 12/21/2017- GOING TO MAKE OV 3. [facility], dentist, last visit 2022, goes [id number] 4. Dr. Marland Kitchen?, Dermatologist, last visit 2021  Patient Care Team: Lucky CowboyMcKeown, William, MD as PCP - General (Internal Medicine) Kathleene HazelMcAlhany, Christopher D, MD as Consulting Physician (Cardiology) Charna ElizabethMann, Jyothi, MD as Consulting Physician (Gastroenterology) Estill Bambergumonski, Mark, MD as Consulting Physician (Orthopedic Surgery) Freddy FinnerNeal, W Ronald, MD as Consulting Physician (Obstetrics and Gynecology)  Allergies No Known Allergies  SURGICAL HISTORY She  has a past surgical history that includes Breast surgery (80's) and Anterior cervical decomp/discectomy fusion (10/22/2011). FAMILY HISTORY Her family history includes Alzheimer's disease in her father, paternal aunt, and paternal uncle; Diabetes in her maternal aunt, maternal grandmother, and maternal uncle; Drug abuse in her maternal grandmother; Heart disease in her mother; Hyperlipidemia in her mother; Hypertension in her mother. SOCIAL HISTORY She  reports that she has never smoked. She has never used smokeless tobacco. She reports current alcohol use of about 7.0 standard drinks of alcohol per week. She reports that she does not use drugs.   Review of Systems  Constitutional:  Negative for chills, fever, malaise/fatigue and weight loss.  HENT: Negative for congestion, ear pain, hearing loss, nosebleeds, sore throat and tinnitus.   Eyes: Negative for blurred vision and double vision.  Respiratory: Negative for cough, shortness of breath and wheezing.   Cardiovascular: Negative for chest pain, palpitations, orthopnea, claudication and leg swelling.  Gastrointestinal: Negative for abdominal pain, blood in stool, constipation, diarrhea, heartburn, melena, nausea and vomiting.  Genitourinary: Negative.        Mild stress incontinence  Musculoskeletal: Positive for joint pain (R hand left finger). Negative for myalgias.  Skin: Negative for rash.  Neurological: Negative for dizziness, tingling, sensory change, loss of consciousness, weakness and headaches.  Endo/Heme/Allergies: Negative for polydipsia.  Psychiatric/Behavioral: Negative for depression. The patient is not nervous/anxious and does not have insomnia.   All other systems reviewed and are negative.    Objective:     Today's Vitals   05/14/20 1350  BP: 132/72  Pulse: 71  Temp: (!) 97.5 F (36.4 C)  SpO2: 97%  Weight: 128 lb (58.1 kg)  Height: 5' 3.5" (1.613 m)   Body mass index is 22.32 kg/m.  General appearance: alert, no distress, WD/WN, female HEENT: normocephalic, sclerae anicteric, TMs pearly, nares patent, no discharge or erythema, pharynx normal Oral cavity: Poor dentition; left lower jaw with teeth absent, erythematous gingiva with ? Scant purulent discharge; front teeth cracked/chipped; otherwise MM intact, uvula midline, sublingual glands non-inflamed Neck: supple, no lymphadenopathy, no thyromegaly, no masses Heart: RRR, normal S1, subtle spit S2, no murmurs Lungs: CTA bilaterally, no wheezes, rhonchi, or rales Abdomen: +bs, soft, non tender, non distended, no masses, no hepatomegaly, no splenomegaly Musculoskeletal: nontender, no swelling, no obvious deformity, symmetrical strength  excepting R 4th digit with limited flexion, pain with extension, mild crepitus PIP joint without erythema/swelling. Distal neurovascular intact. No palpable palmar growth.  Extremities: no edema, no cyanosis, no clubbing Pulses: 2+ symmetric, upper and lower extremities, normal cap refill Neurological: alert, oriented x 3, CN2-12 intact, strength normal upper extremities and lower extremities, sensation normal throughout, DTRs 2+ throughout, no cerebellar signs, gait normal. Sensation intact to monofilament bil feet.  Psychiatric: normal affect, behavior normal, pleasant  Skin: Warm/dry, intact, without notable lesions, ecchymosis, rash  EKG: Sinus brady, CRBBB  Elizabeth ShadowAshley C  Rejoice Heatwole, NP   05/14/2020

## 2020-05-14 ENCOUNTER — Ambulatory Visit: Payer: Medicare PPO | Admitting: Adult Health

## 2020-05-14 ENCOUNTER — Encounter: Payer: Self-pay | Admitting: Adult Health

## 2020-05-14 ENCOUNTER — Other Ambulatory Visit: Payer: Self-pay

## 2020-05-14 VITALS — BP 132/72 | HR 71 | Temp 97.5°F | Ht 63.5 in | Wt 128.0 lb

## 2020-05-14 DIAGNOSIS — N183 Chronic kidney disease, stage 3 unspecified: Secondary | ICD-10-CM | POA: Diagnosis not present

## 2020-05-14 DIAGNOSIS — N1831 Chronic kidney disease, stage 3a: Secondary | ICD-10-CM

## 2020-05-14 DIAGNOSIS — Z1231 Encounter for screening mammogram for malignant neoplasm of breast: Secondary | ICD-10-CM

## 2020-05-14 DIAGNOSIS — E2839 Other primary ovarian failure: Secondary | ICD-10-CM

## 2020-05-14 DIAGNOSIS — E1122 Type 2 diabetes mellitus with diabetic chronic kidney disease: Secondary | ICD-10-CM

## 2020-05-14 DIAGNOSIS — M546 Pain in thoracic spine: Secondary | ICD-10-CM

## 2020-05-14 DIAGNOSIS — Z136 Encounter for screening for cardiovascular disorders: Secondary | ICD-10-CM | POA: Diagnosis not present

## 2020-05-14 DIAGNOSIS — I10 Essential (primary) hypertension: Secondary | ICD-10-CM

## 2020-05-14 DIAGNOSIS — G4700 Insomnia, unspecified: Secondary | ICD-10-CM

## 2020-05-14 DIAGNOSIS — E039 Hypothyroidism, unspecified: Secondary | ICD-10-CM

## 2020-05-14 DIAGNOSIS — E1169 Type 2 diabetes mellitus with other specified complication: Secondary | ICD-10-CM | POA: Diagnosis not present

## 2020-05-14 DIAGNOSIS — Z1389 Encounter for screening for other disorder: Secondary | ICD-10-CM

## 2020-05-14 DIAGNOSIS — M19041 Primary osteoarthritis, right hand: Secondary | ICD-10-CM | POA: Insufficient documentation

## 2020-05-14 DIAGNOSIS — K047 Periapical abscess without sinus: Secondary | ICD-10-CM

## 2020-05-14 DIAGNOSIS — E559 Vitamin D deficiency, unspecified: Secondary | ICD-10-CM | POA: Diagnosis not present

## 2020-05-14 DIAGNOSIS — I4510 Unspecified right bundle-branch block: Secondary | ICD-10-CM

## 2020-05-14 DIAGNOSIS — I452 Bifascicular block: Secondary | ICD-10-CM

## 2020-05-14 DIAGNOSIS — E538 Deficiency of other specified B group vitamins: Secondary | ICD-10-CM

## 2020-05-14 DIAGNOSIS — Z0000 Encounter for general adult medical examination without abnormal findings: Secondary | ICD-10-CM | POA: Diagnosis not present

## 2020-05-14 DIAGNOSIS — Z6822 Body mass index (BMI) 22.0-22.9, adult: Secondary | ICD-10-CM

## 2020-05-14 DIAGNOSIS — Z79899 Other long term (current) drug therapy: Secondary | ICD-10-CM

## 2020-05-14 DIAGNOSIS — M79644 Pain in right finger(s): Secondary | ICD-10-CM | POA: Insufficient documentation

## 2020-05-14 MED ORDER — TRAMADOL HCL 50 MG PO TABS: ORAL_TABLET | ORAL | 0 refills | Status: DC

## 2020-05-14 MED ORDER — AMOXICILLIN-POT CLAVULANATE 875-125 MG PO TABS: 1.0000 | ORAL_TABLET | Freq: Two times a day (BID) | ORAL | 0 refills | Status: AC

## 2020-05-14 NOTE — Patient Instructions (Addendum)
   Elizabeth May , Thank you for taking time to come for your Annual Wellness Visit. I appreciate your ongoing commitment to your health goals. Please review the following plan we discussed and let me know if I can assist you in the future.   These are the goals we discussed: Goals    . HEMOGLOBIN A1C < 7    . LDL CALC < 70       This is a list of the screening recommended for you and due dates:  Health Maintenance  Topic Date Due  . Eye exam for diabetics  12/21/2017  . Flu Shot  10/22/2019  . COVID-19 Vaccine (3 - Booster for Pfizer series) 11/15/2019  . Mammogram  05/12/2020  . Hemoglobin A1C  06/17/2020  . Tetanus Vaccine  07/16/2020  . Complete foot exam   05/14/2021  . Cologuard (Stool DNA test)  09/19/2022  . DEXA scan (bone density measurement)  Completed  .  Hepatitis C: One time screening is recommended by Center for Disease Control  (CDC) for  adults born from [birth year] through 1963.   Completed  . Pneumonia vaccines  Completed    If you take calcium recommend with K2 - max 300 mg once or twice daily for calcium   Please send covid vaccine information - missing first 2 shots  Sent in augmentin for dental/oral infection   Please get hand xray at [address] [facility] center  HOW TO SCHEDULE A MAMMOGRAM AND BONE DENSITY   [facility]  7 a.m.-6:30 p.m., Monday 7 a.m.-5 p.m., Tuesday-Friday Schedule an appointment by calling [phone].        Know what a healthy weight is for you (roughly BMI <25) and aim to maintain this  Aim for 7+ servings of fruits and vegetables daily  65-80+ fluid ounces of water or unsweet tea for healthy kidneys  Limit to max 1 drink of alcohol per day; avoid smoking/tobacco  Limit animal fats in diet for cholesterol and heart health - choose grass fed whenever available  Avoid highly processed foods, and foods high in saturated/trans fats  Aim for low stress - take time to unwind and care for  your mental health  Aim for 150 min of moderate intensity exercise weekly for heart health, and weights twice weekly for bone health  Aim for 7-9 hours of sleep daily

## 2020-05-15 ENCOUNTER — Ambulatory Visit
Admission: RE | Admit: 2020-05-15 | Discharge: 2020-05-15 | Disposition: A | Discharge status: Home / Self Care | Payer: Medicare PPO | Source: Ambulatory Visit | Attending: Adult Health | Admitting: Adult Health

## 2020-05-15 DIAGNOSIS — M19041 Primary osteoarthritis, right hand: Secondary | ICD-10-CM | POA: Diagnosis not present

## 2020-05-15 DIAGNOSIS — M79644 Pain in right finger(s): Secondary | ICD-10-CM

## 2020-05-15 LAB — CBC WITH DIFFERENTIAL/PLATELET
Absolute Monocytes: 387 cells/uL (ref 200–950)
Basophils Absolute: 70 cells/uL (ref 0–200)
Basophils Relative: 0.8 %
Eosinophils Absolute: 167 cells/uL (ref 15–500)
Eosinophils Relative: 1.9 %
HCT: 45.1 % — ABNORMAL HIGH (ref 35.0–45.0)
Hemoglobin: 15 g/dL (ref 11.7–15.5)
Lymphs Abs: 2746 cells/uL (ref 850–3900)
MCH: 29 pg (ref 27.0–33.0)
MCHC: 33.3 g/dL (ref 32.0–36.0)
MCV: 87.2 fL (ref 80.0–100.0)
MPV: 11.2 fL (ref 7.5–12.5)
Monocytes Relative: 4.4 %
Neutro Abs: 5430 cells/uL (ref 1500–7800)
Neutrophils Relative %: 61.7 %
Platelets: 397 10*3/uL (ref 140–400)
RBC: 5.17 10*6/uL — ABNORMAL HIGH (ref 3.80–5.10)
RDW: 12.2 % (ref 11.0–15.0)
Total Lymphocyte: 31.2 %
WBC: 8.8 10*3/uL (ref 3.8–10.8)

## 2020-05-15 LAB — COMPLETE METABOLIC PANEL WITH GFR
AG Ratio: 1.7 (calc) (ref 1.0–2.5)
ALT: 19 U/L (ref 6–29)
AST: 16 U/L (ref 10–35)
Albumin: 4.3 g/dL (ref 3.6–5.1)
Alkaline phosphatase (APISO): 74 U/L (ref 37–153)
BUN/Creatinine Ratio: 15 (calc) (ref 6–22)
BUN: 15 mg/dL (ref 7–25)
CO2: 31 mmol/L (ref 20–32)
Calcium: 10 mg/dL (ref 8.6–10.4)
Chloride: 100 mmol/L (ref 98–110)
Creat: 0.97 mg/dL — ABNORMAL HIGH (ref 0.60–0.93)
GFR, Est African American: 67 mL/min/{1.73_m2} (ref >=60)
GFR, Est Non African American: 58 mL/min/{1.73_m2} — ABNORMAL LOW (ref >=60)
Globulin: 2.6 g/dL (calc) (ref 1.9–3.7)
Glucose, Bld: 130 mg/dL — ABNORMAL HIGH (ref 65–99)
Potassium: 4.4 mmol/L (ref 3.5–5.3)
Sodium: 138 mmol/L (ref 135–146)
Total Bilirubin: 0.5 mg/dL (ref 0.2–1.2)
Total Protein: 6.9 g/dL (ref 6.1–8.1)

## 2020-05-15 LAB — URINALYSIS, ROUTINE W REFLEX MICROSCOPIC
Bilirubin Urine: NEGATIVE
Glucose, UA: NEGATIVE
Hgb urine dipstick: NEGATIVE
Ketones, ur: NEGATIVE
Leukocytes,Ua: NEGATIVE
Nitrite: NEGATIVE
Protein, ur: NEGATIVE
Specific Gravity, Urine: 1.012 (ref 1.001–1.03)
pH: <=5 (ref 5.0–8.0)

## 2020-05-15 LAB — MICROALBUMIN / CREATININE URINE RATIO
Creatinine, Urine: 67 mg/dL (ref 20–275)
Microalb Creat Ratio: 7 mcg/mg creat (ref <30)
Microalb, Ur: 0.5 mg/dL

## 2020-05-15 LAB — VITAMIN D 25 HYDROXY (VIT D DEFICIENCY, FRACTURES): Vit D, 25-Hydroxy: 42 ng/mL (ref 30–100)

## 2020-05-15 LAB — LIPID PANEL
Cholesterol: 123 mg/dL (ref <200)
HDL: 49 mg/dL — ABNORMAL LOW (ref >=50)
LDL Cholesterol (Calc): 50 mg/dL (calc)
Non-HDL Cholesterol (Calc): 74 mg/dL (calc) (ref <130)
Total CHOL/HDL Ratio: 2.5 (calc) (ref <5.0)
Triglycerides: 154 mg/dL — ABNORMAL HIGH (ref <150)

## 2020-05-15 LAB — HEMOGLOBIN A1C
Hgb A1c MFr Bld: 9.6 % of total Hgb — ABNORMAL HIGH (ref <5.7)
Mean Plasma Glucose: 229 mg/dL
eAG (mmol/L): 12.7 mmol/L

## 2020-05-15 LAB — MAGNESIUM: Magnesium: 1.8 mg/dL (ref 1.5–2.5)

## 2020-05-15 LAB — VITAMIN B12: Vitamin B-12: 368 pg/mL (ref 200–1100)

## 2020-05-15 LAB — TSH: TSH: 2.52 mIU/L (ref 0.40–4.50)

## 2020-05-16 ENCOUNTER — Encounter: Payer: Self-pay | Admitting: Adult Health

## 2020-05-16 DIAGNOSIS — M19042 Primary osteoarthritis, left hand: Secondary | ICD-10-CM | POA: Insufficient documentation

## 2020-05-17 ENCOUNTER — Other Ambulatory Visit: Payer: Self-pay | Admitting: Adult Health

## 2020-05-17 DIAGNOSIS — Z79899 Other long term (current) drug therapy: Secondary | ICD-10-CM

## 2020-05-17 MED ORDER — EMPAGLIFLOZIN 25 MG PO TABS: ORAL_TABLET | ORAL | 1 refills | Status: DC

## 2020-07-04 ENCOUNTER — Ambulatory Visit: Payer: Medicare PPO

## 2020-08-06 ENCOUNTER — Other Ambulatory Visit: Payer: Self-pay

## 2020-08-06 ENCOUNTER — Ambulatory Visit (INDEPENDENT_AMBULATORY_CARE_PROVIDER_SITE_OTHER): Payer: Medicare PPO

## 2020-08-06 DIAGNOSIS — Z79899 Other long term (current) drug therapy: Secondary | ICD-10-CM | POA: Diagnosis not present

## 2020-08-06 NOTE — Progress Notes (Signed)
Patient presents to the office for a nurse visit to have labs done to check Kidney/Potassium levels. Started taking Jardiance 25mg , taking 1/2 tablet. This is day one taking 1 full tablet. Hasn't had any side effects thus far, tolerating well. Vitals taken and recorded.

## 2020-08-07 LAB — BASIC METABOLIC PANEL WITH GFR
BUN/Creatinine Ratio: 25 (calc) — ABNORMAL HIGH (ref 6–22)
BUN: 31 mg/dL — ABNORMAL HIGH (ref 7–25)
CO2: 24 mmol/L (ref 20–32)
Calcium: 10.5 mg/dL — ABNORMAL HIGH (ref 8.6–10.4)
Chloride: 103 mmol/L (ref 98–110)
Creat: 1.23 mg/dL — ABNORMAL HIGH (ref 0.60–0.93)
GFR, Est African American: 50 mL/min/{1.73_m2} — ABNORMAL LOW (ref >=60)
GFR, Est Non African American: 43 mL/min/{1.73_m2} — ABNORMAL LOW (ref >=60)
Glucose, Bld: 153 mg/dL — ABNORMAL HIGH (ref 65–99)
Potassium: 4.1 mmol/L (ref 3.5–5.3)
Sodium: 138 mmol/L (ref 135–146)

## 2020-08-14 DIAGNOSIS — J029 Acute pharyngitis, unspecified: Secondary | ICD-10-CM | POA: Diagnosis not present

## 2020-08-14 DIAGNOSIS — U071 COVID-19: Secondary | ICD-10-CM | POA: Diagnosis not present

## 2020-08-14 DIAGNOSIS — R519 Headache, unspecified: Secondary | ICD-10-CM | POA: Diagnosis not present

## 2020-08-14 DIAGNOSIS — R5383 Other fatigue: Secondary | ICD-10-CM | POA: Diagnosis not present

## 2020-08-14 DIAGNOSIS — R0789 Other chest pain: Secondary | ICD-10-CM | POA: Diagnosis not present

## 2020-08-14 DIAGNOSIS — R059 Cough, unspecified: Secondary | ICD-10-CM | POA: Diagnosis not present

## 2020-09-13 NOTE — Progress Notes (Signed)
Patient ID: [name], female   DOB: 12/18/1945, 75 y.o.   MRN: [id number]   MEDICARE WELLNESS AND 3 MONTH FOLLOW UP  Assessment and Plan    Annual Medicare Wellness Visit Due annually  Send covid 19 boosster information Schedule overdue diabetic eye exam and forward report Schedule overdue Mmg and DEXA - orders in system   Essential hypertension - continue medications, DASH diet, exercise and monitor at home. Call if greater than 130/80.  -     CBC with Differential/Platelet -     CMP/GFR -     Medicare  RBBB  Stable on recent EKG, no concerning sx, monitor   Type 2 diabetes mellitus with stage 3 chronic kidney disease, without long-term current use of insulin (HCC) Discussed general issues about diabetes pathophysiology and management., Educational material distributed., Suggested low cholesterol diet., Encouraged aerobic exercise., Discussed foot care., Reminded to get yearly retinal exam - OVERDUE, forward -     Hemoglobin A1c  Hyperlipidemia associated with T2DM (HCC) Continue statin for LDL goal <70 check lipids, decrease fatty foods, increase activity.  -     Lipid panel  CKD IIIa associated with T2DM (HCC) Increase fluids, avoid NSAIDS, monitor sugars, will monitor      -      CMP/GFR  Hypothyroidism, unspecified type Hypothyroidism-check TSH level, continue medications the same, reminded to take on an empty stomach 30-83mins before food.  -     TSH  Cervical radiculopathy at C6 s/p fusion, takes tramadol PRN, mainly for travel  Vitamin D deficiency Continue supplementation Check vitamin D level  Insomnia - trazodone is beneficial  - good sleep hygiene discussed, increase day time activity  Medication management -     Magnesium  Encounter for screening mammogram for malignant neoplasm of breast Order in system from last, OVERDUE, discussed importance and patient agrees to schedule ASAP, phone number given -     MM Digital Screening;  Future  Estrogen deficiency -     DG Bone Density; Future - order in system, given phone number to schedule  Right hand arthritis  Continue tylenol, voltaren or aspercreme 3-4 times daily Managing fairly  Plan hand specialist referral if worsening  Weight loss, unintended <10% in last 6 months, patient concerned about ? Parasite hx, reviewed in systema and paper chart without evidence excepting MRSA, reviewed this - will proceed with parasite/OVA r/o, recheck TSH - encouraged to schedule overdue mmg for age appropriate cancer screening - no other specific concerning sx/fingings; IF continued weight loss will plan CT chest/abd/pelvis for unexplained weight loss in elder   Orders Placed This Encounter  Procedures   Ova and parasite examination   CBC with Differential/Platelet   COMPLETE METABOLIC PANEL WITH GFR   Magnesium   Lipid panel   TSH   Hemoglobin A1c    Over 40 minutes of exam, counseling, chart review and critical decision making was performed Future Appointments  Date Time Provider Department Center  12/18/2020 10:30 AM [name], MD [facility] None  05/14/2021  2:00 PM Judd Gaudier, NP [facility] None  09/16/2021 10:30 AM Judd Gaudier, NP [facility] None     Plan:   During the course of the visit the patient was educated and counseled about appropriate screening and preventive services including:   Pneumococcal vaccine  Prevnar 13 Influenza vaccine Td vaccine Screening electrocardiogram Bone densitometry screening Colorectal cancer screening Diabetes screening Glaucoma screening Nutrition counseling  Advanced directives: requested    Subjective:  Elizabeth May  is a 75 y.o. female who presents for AWV and 3 month follow up. She has Essential hypertension; Cervical radiculopathy at C6; Type 2 diabetes mellitus (HCC); Hypothyroidism; Hyperlipidemia associated with type 2 diabetes mellitus (HCC); Vitamin D deficiency; Medication management;  CKD stage 3 due to type 2 diabetes mellitus (HCC); BMI 22.0-22.9, adult; Insomnia; RBBB; Arthritis of right hand; Arthritis of left hand; and Weight loss, unintentional on their problem list.   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 Spends a lot of time at [facility] [place], leaving there on Friday.   She is taking trazodone for insomnia occasionally if needed, takes when traveling. She has hx of cervical fusion for radiculopathy at C6; she takes tramadol PRN rarely for pain while on long trips.   She reports injured R hand 01/2020, was having pain in 4th digit, xray showed no fracture but arthritic changes, managing with tylenol, topical voltaren.   She is concerned about possible parasite, reports has hx of this remotely and was on unknown med for several years prior to stopping, has been losing weight this past year (weight down from 146 lb in 05/2018 > 134 on 11/2018- down 10 lb since 04/2020 was at 128 lb, 118 lb today). Several weeks ago noted "worm like thing" in skin of R thigh, disappeared before she could take a photo. She endorses some fatigue, denies GI sx or otherwise atypical sx.   BMI is Body mass index is 20.9 kg/m., she has been working on diet and exercise, eating less, walking 1-1.5 hours daily if weather is good. She drinks glass of wine most evenings. Admits needs to increase water intake could be better. Generally eats healthy, minimal starches.  Wt Readings from Last 3 Encounters:  09/16/20 118 lb (53.5 kg)  08/06/20 120 lb (54.4 kg)  05/14/20 128 lb (58.1 kg)   She has had elevated blood pressure for years. Her blood pressure has been controlled at home, today their BP is BP: 112/68 She does workout. She denies chest pain, shortness of breath, dizziness.    She has diabetes type 2  with CKD IIIa - jardiance, ACEi With hyperlipidemia on lipitor 40 mg at goal less than 70  with metformin 2000 mg daily, glipizide 10 mg TID, newly on jardiance and denies  foot ulcerations, hyperglycemia, hypoglycemia , increased appetite, nausea, paresthesia of the feet, polydipsia, polyuria, visual disturbances, vomiting and weight loss.  She reports fasting ranges 110-140 or so.  Last A1C in the office was:  Lab Results  Component Value Date   HGBA1C 9.6 (H) 05/14/2020   Lab Results  Component Value Date   CHOL 123 05/14/2020   HDL 49 (L) 05/14/2020   LDLCALC 50 05/14/2020   TRIG 154 (H) 05/14/2020   CHOLHDL 2.5 05/14/2020   She has CKD IIIa associated with T2DM monitored at this office:  Lab Results  Component Value Date   GFRNONAA 43 (L) 08/06/2020   Patient is on Vitamin D supplement, takes 200 mg BID with calcium.    Lab Results  Component Value Date   VD25OH 42 05/14/2020     She is on thyroid medication. Her medication was not changed last visit. Taking 50 mcg daily.  Lab Results  Component Value Date   TSH 2.52 05/14/2020   She is not currenlty on B12 supplement Lab Results  Component Value Date   VITAMINB12 368 05/14/2020      Medication Review:  Current Outpatient Medications (Endocrine & Metabolic):  empagliflozin (JARDIANCE) 25 MG TABS tablet, Start 1/2 tab daily for 1 month then increase to 1 tab daily.   glipiZIDE (GLUCOTROL) 10 MG tablet, TAKE 1 TABLET BY MOUTH 3  TIMES DAILY WITH MEALS   levothyroxine (SYNTHROID) 50 MCG tablet, TAKE 1 TABLET DAILY ON AN EMPTY STOMACH WITH ONLY WATER FOR 30 MINUTES   metFORMIN (GLUCOPHAGE-XR) 500 MG 24 hr tablet, Take 2 tablets 2 x /day with Meals for Diabetes  Current Outpatient Medications (Cardiovascular):    amLODipine (NORVASC) 5 MG tablet, Take 1 tablet Daily for BP   lisinopril-hydrochlorothiazide (ZESTORETIC) 20-25 MG tablet, Take 1/2-1 tablet Daily for BP   atorvastatin (LIPITOR) 40 MG tablet, Take 1 tablet Daily for Cholesterol  Current Outpatient Medications (Respiratory):    albuterol (PROVENTIL HFA;VENTOLIN HFA) 108 (90 Base) MCG/ACT inhaler, Inhale 2 puffs into the  lungs every 6 (six) hours as needed for wheezing or shortness of breath (cough).   azelastine (ASTELIN) 0.1 % nasal spray, Place 2 sprays into both nostrils 2 (two) times daily. Use in each nostril as directed   fluticasone (FLONASE) 50 MCG/ACT nasal spray, Place 2 sprays into both nostrils daily.   promethazine-dextromethorphan (PROMETHAZINE-DM) 6.25-15 MG/5ML syrup, Take 5 mLs by mouth 4 (four) times daily as needed for cough.  Current Outpatient Medications (Analgesics):    traMADol (ULTRAM) 50 MG tablet, Take 1 tab as needed up to every 6 hours for severe pain.   Current Outpatient Medications (Other):    Biotin 10 MG TABS, Take by mouth daily.   Calcium Carbonate-Vitamin D (CALCIUM-VITAMIN D) 500-200 MG-UNIT per tablet, Take 1 tablet by mouth 2 (two) times daily with a meal.   CINNAMON PO, Take 1,000 Units by mouth daily. 2 capsules daily   Magnesium 250 MG TABS, Take 250 mg by mouth daily.   traZODone (DESYREL) 150 MG tablet, Take 1 tablet (150 mg total) by mouth at bedtime. Take 1/2 to 1 tablet at bedtime as needed for sleep  Current Problems (verified) Patient Active Problem List   Diagnosis Date Noted   Weight loss, unintentional 09/16/2020   Arthritis of left hand 05/16/2020   Arthritis of right hand 05/14/2020   RBBB 02/08/2018   Insomnia 10/27/2017   CKD stage 3 due to type 2 diabetes mellitus (HCC) 06/27/2017   BMI 22.0-22.9, adult 06/27/2017   Medication management 03/08/2014   Hyperlipidemia associated with type 2 diabetes mellitus (HCC) 03/21/2013   Vitamin D deficiency 03/21/2013   Type 2 diabetes mellitus (HCC) 03/17/2013   Hypothyroidism    Cervical radiculopathy at C6 10/22/2011   Essential hypertension 07/01/2009    Screening Tests Immunization History  Administered Date(s) Administered   Influenza Split 02/13/2013   Influenza, High Dose Seasonal PF 12/07/2014, 12/09/2015, 12/15/2016, 02/08/2018, 12/16/2018   Influenza-Unspecified 03/26/2014    PFIZER(Purple Top)SARS-COV-2 Vaccination 04/20/2019, 05/18/2019   Pneumococcal Conjugate-13 12/09/2015   Pneumococcal-Unspecified 03/23/2008   Tdap 07/17/2010   Zoster Recombinat (Shingrix) 12/16/2018, 04/06/2019   Zoster, Live 01/14/2012    Tdap: 06/2010 - DUE, Td today  Influenza: 10/2019 Pneumonia: 2010 Prevnar: 2017 Shingrix: 2/2 2021 Covid 19: 3/3, 2021, pfizer - requested booster date  Preventative care: Last colonoscopy: 2006 cologuard 08/2019 Last mammogram: 04/2018 - overdue, ordered in system, reminded to schedule at breast center  DEXA:2013 - ordered to schedule with mammogram   Names of Other Physician/Practitioners you currently use: 1. [facility] here for primary care 2. [facility], last visit 12/21/2017- GOING TO MAKE  OV [facility]. [facility], dentist, last visit 2022, goes q43m 4. Dr. Marland Kitchen Dermatologist, last visit 2021  Patient Care Team: [name], MD as PCP - General (Internal Medicine) Kathleene Hazel, MD as Consulting Physician (Cardiology) Charna Elizabeth, MD as Consulting Physician (Gastroenterology) Estill Bamberg, MD as Consulting Physician (Orthopedic Surgery) Freddy Finner, MD as Consulting Physician (Obstetrics and Gynecology)  Allergies No Known Allergies  SURGICAL HISTORY She  has a past surgical history that includes Breast surgery (80's) and Anterior cervical decomp/discectomy fusion (10/22/2011). FAMILY HISTORY Her family history includes Alzheimer's disease in her father, paternal aunt, and paternal uncle; Diabetes in her maternal aunt, maternal grandmother, and maternal uncle; Drug abuse in her maternal grandmother; Heart disease in her mother; Hyperlipidemia in her mother; Hypertension in her mother. SOCIAL HISTORY She  reports that she has never smoked. She has never used smokeless tobacco. She reports current alcohol use of about 7.0 standard drinks of alcohol per week. She  reports that she does not use drugs.  MEDICARE WELLNESS OBJECTIVES: Physical activity: Current Exercise Habits: Home exercise routine, Type of exercise: walking, Time (Minutes): 60, Frequency (Times/Week): 7, Weekly Exercise (Minutes/Week): 420, Intensity: Mild, Exercise limited by: None identified Cardiac risk factors: Cardiac Risk Factors include: advanced age (>38men, >29 women);dyslipidemia;hypertension;diabetes mellitus Depression/mood screen:   Depression screen [facility] 2/9 09/16/2020  Decreased Interest 0  Down, Depressed, Hopeless 0  PHQ - 2 Score 0    ADLs:  In your present state of health, do you have any difficulty performing the following activities: 09/16/2020  Hearing? N  Vision? N  Difficulty concentrating or making decisions? N  Walking or climbing stairs? N  Dressing or bathing? N  Doing errands, shopping? N  Some recent data might be hidden     Cognitive Testing  Alert? Yes  Normal Appearance?Yes  Oriented to person? Yes  Place? Yes   Time? Yes  Recall of three objects?  Yes  Can perform simple calculations? Yes  Displays appropriate judgment?Yes  Can read the correct time from a watch face?Yes  EOL planning: Does Patient Have a Medical Advance Directive?: No Would patient like information on creating a medical advance directive?: Yes (MAU/Ambulatory/Procedural Areas - Information given)     Review of Systems  Constitutional:  Positive for weight loss (gradual over last 2 years). Negative for chills, fever and malaise/fatigue.  HENT:  Negative for congestion, ear pain, hearing loss, nosebleeds, sore throat and tinnitus.   Eyes:  Negative for blurred vision and double vision.  Respiratory:  Negative for cough, shortness of breath and wheezing.   Cardiovascular:  Negative for chest pain, palpitations, orthopnea, claudication and leg swelling.  Gastrointestinal:  Negative for abdominal pain, blood in stool, constipation, diarrhea, heartburn, melena, nausea and  vomiting.  Genitourinary: Negative.        Mild stress incontinence  Musculoskeletal:  Positive for joint pain (R hand left finger). Negative for myalgias.  Skin:  Negative for rash.  Neurological:  Negative for dizziness, tingling, sensory change, loss of consciousness, weakness and headaches.  Endo/Heme/Allergies:  Negative for polydipsia.  Psychiatric/Behavioral:  Negative for depression, memory loss and substance abuse. The patient is not nervous/anxious and does not have insomnia.   All other systems reviewed and are negative.   Objective:     Today's Vitals   09/16/20 1042  BP: 112/68  Pulse: 75  Temp: (!) 97.3 F (36.3 C)  SpO2: 97%  Weight: 118 lb (53.5 kg)   Body mass index is 20.9 kg/m.  General appearance: alert, no distress, WD/WN, female HEENT: normocephalic, sclerae anicteric, TMs pearly, nares patent, no discharge or erythema, pharynx normal Oral cavity: Poor dentition; left lower jaw with teeth absent, erythematous gingiva with ? Scant purulent discharge; front teeth cracked/chipped; otherwise MM intact, uvula midline, sublingual glands non-inflamed Neck: supple, no lymphadenopathy, no thyromegaly, no masses Heart: RRR, normal S1, subtle spit S2, no murmurs Lungs: CTA bilaterally, no wheezes, rhonchi, or rales Abdomen: +bs, soft, non tender, non distended, no masses, no hepatomegaly, no splenomegaly Musculoskeletal: nontender, no swelling, no obvious deformity, symmetrical strength excepting R 4th digit with limited flexion, pain with extension, mild crepitus PIP joint without erythema/swelling. Distal neurovascular intact. No palpable palmar growth.  Extremities: no edema, no cyanosis, no clubbing Pulses: 2+ symmetric, upper and lower extremities, normal cap refill Neurological: alert, oriented x 3, CN2-12 intact, strength normal upper extremities and lower extremities, sensation normal throughout, DTRs 2+ throughout, no cerebellar signs, gait normal. Sensation  intact to monofilament bil feet.  Psychiatric: normal affect, behavior normal, pleasant  Skin: Warm/dry, intact, without notable lesions, ecchymosis, rash   Medicare Attestation I have personally reviewed: The patient's medical and social history Their use of alcohol, tobacco or illicit drugs Their current medications and supplements The patient's functional ability including ADLs,fall risks, home safety risks, cognitive, and hearing and visual impairment Diet and physical activities Evidence for depression or mood disorders  The patient's weight, height, BMI, and visual acuity have been recorded in the chart.  I have made referrals, counseling, and provided education to the patient based on review of the above and I have provided the patient with a written personalized care plan for preventive services.     Dan Maker, NP   09/16/2020

## 2020-09-16 ENCOUNTER — Encounter: Payer: Self-pay | Admitting: Adult Health

## 2020-09-16 ENCOUNTER — Other Ambulatory Visit: Payer: Self-pay

## 2020-09-16 ENCOUNTER — Ambulatory Visit: Payer: Medicare PPO | Admitting: Adult Health

## 2020-09-16 VITALS — BP 112/68 | HR 75 | Temp 97.3°F | Wt 118.0 lb

## 2020-09-16 DIAGNOSIS — G4700 Insomnia, unspecified: Secondary | ICD-10-CM

## 2020-09-16 DIAGNOSIS — I4510 Unspecified right bundle-branch block: Secondary | ICD-10-CM

## 2020-09-16 DIAGNOSIS — M19042 Primary osteoarthritis, left hand: Secondary | ICD-10-CM

## 2020-09-16 DIAGNOSIS — Z6822 Body mass index (BMI) 22.0-22.9, adult: Secondary | ICD-10-CM | POA: Diagnosis not present

## 2020-09-16 DIAGNOSIS — Z0000 Encounter for general adult medical examination without abnormal findings: Secondary | ICD-10-CM

## 2020-09-16 DIAGNOSIS — I10 Essential (primary) hypertension: Secondary | ICD-10-CM | POA: Diagnosis not present

## 2020-09-16 DIAGNOSIS — E559 Vitamin D deficiency, unspecified: Secondary | ICD-10-CM

## 2020-09-16 DIAGNOSIS — N1831 Chronic kidney disease, stage 3a: Secondary | ICD-10-CM | POA: Diagnosis not present

## 2020-09-16 DIAGNOSIS — N183 Chronic kidney disease, stage 3 unspecified: Secondary | ICD-10-CM

## 2020-09-16 DIAGNOSIS — M19041 Primary osteoarthritis, right hand: Secondary | ICD-10-CM

## 2020-09-16 DIAGNOSIS — E1122 Type 2 diabetes mellitus with diabetic chronic kidney disease: Secondary | ICD-10-CM | POA: Diagnosis not present

## 2020-09-16 DIAGNOSIS — R6889 Other general symptoms and signs: Secondary | ICD-10-CM

## 2020-09-16 DIAGNOSIS — Z0001 Encounter for general adult medical examination with abnormal findings: Secondary | ICD-10-CM

## 2020-09-16 DIAGNOSIS — M5412 Radiculopathy, cervical region: Secondary | ICD-10-CM | POA: Diagnosis not present

## 2020-09-16 DIAGNOSIS — E039 Hypothyroidism, unspecified: Secondary | ICD-10-CM | POA: Diagnosis not present

## 2020-09-16 DIAGNOSIS — E538 Deficiency of other specified B group vitamins: Secondary | ICD-10-CM | POA: Insufficient documentation

## 2020-09-16 DIAGNOSIS — E1169 Type 2 diabetes mellitus with other specified complication: Secondary | ICD-10-CM | POA: Diagnosis not present

## 2020-09-16 DIAGNOSIS — E785 Hyperlipidemia, unspecified: Secondary | ICD-10-CM

## 2020-09-16 DIAGNOSIS — Z79899 Other long term (current) drug therapy: Secondary | ICD-10-CM

## 2020-09-16 DIAGNOSIS — E782 Mixed hyperlipidemia: Secondary | ICD-10-CM

## 2020-09-16 DIAGNOSIS — R634 Abnormal weight loss: Secondary | ICD-10-CM | POA: Insufficient documentation

## 2020-09-16 MED ORDER — ATORVASTATIN CALCIUM 40 MG PO TABS: ORAL_TABLET | ORAL | 3 refills | Status: DC

## 2020-09-16 NOTE — Patient Instructions (Addendum)
   Suggest getting on sublingual B12 - (dissolves under tongue)  Please send date/information for covid 19 booster  Please schedule diabetes eye exam - need annually - please have them send [place] report once completed    HOW TO SCHEDULE A MAMMOGRAM and BONE DENSITY   [facility]  7 a.m.-6:30 p.m., Monday 7 a.m.-5 p.m., Tuesday-Friday Schedule an appointment by calling [phone].

## 2020-09-17 LAB — COMPLETE METABOLIC PANEL WITH GFR
AG Ratio: 1.8 (calc) (ref 1.0–2.5)
ALT: 12 U/L (ref 6–29)
AST: 15 U/L (ref 10–35)
Albumin: 4.2 g/dL (ref 3.6–5.1)
Alkaline phosphatase (APISO): 64 U/L (ref 37–153)
BUN/Creatinine Ratio: 23 (calc) — ABNORMAL HIGH (ref 6–22)
BUN: 28 mg/dL — ABNORMAL HIGH (ref 7–25)
CO2: 28 mmol/L (ref 20–32)
Calcium: 10 mg/dL (ref 8.6–10.4)
Chloride: 102 mmol/L (ref 98–110)
Creat: 1.21 mg/dL — ABNORMAL HIGH (ref 0.60–0.93)
GFR, Est African American: 51 mL/min/{1.73_m2} — ABNORMAL LOW (ref >=60)
GFR, Est Non African American: 44 mL/min/{1.73_m2} — ABNORMAL LOW (ref >=60)
Globulin: 2.4 g/dL (calc) (ref 1.9–3.7)
Glucose, Bld: 99 mg/dL (ref 65–99)
Potassium: 4.3 mmol/L (ref 3.5–5.3)
Sodium: 141 mmol/L (ref 135–146)
Total Bilirubin: 0.4 mg/dL (ref 0.2–1.2)
Total Protein: 6.6 g/dL (ref 6.1–8.1)

## 2020-09-17 LAB — CBC WITH DIFFERENTIAL/PLATELET
Absolute Monocytes: 450 cells/uL (ref 200–950)
Basophils Absolute: 72 cells/uL (ref 0–200)
Basophils Relative: 0.8 %
Eosinophils Absolute: 189 cells/uL (ref 15–500)
Eosinophils Relative: 2.1 %
HCT: 45.9 % — ABNORMAL HIGH (ref 35.0–45.0)
Hemoglobin: 14.9 g/dL (ref 11.7–15.5)
Lymphs Abs: 2439 cells/uL (ref 850–3900)
MCH: 28.7 pg (ref 27.0–33.0)
MCHC: 32.5 g/dL (ref 32.0–36.0)
MCV: 88.3 fL (ref 80.0–100.0)
MPV: 10.7 fL (ref 7.5–12.5)
Monocytes Relative: 5 %
Neutro Abs: 5850 cells/uL (ref 1500–7800)
Neutrophils Relative %: 65 %
Platelets: 344 10*3/uL (ref 140–400)
RBC: 5.2 10*6/uL — ABNORMAL HIGH (ref 3.80–5.10)
RDW: 12.5 % (ref 11.0–15.0)
Total Lymphocyte: 27.1 %
WBC: 9 10*3/uL (ref 3.8–10.8)

## 2020-09-17 LAB — LIPID PANEL
Cholesterol: 146 mg/dL (ref <200)
HDL: 60 mg/dL (ref >=50)
LDL Cholesterol (Calc): 66 mg/dL (calc)
Non-HDL Cholesterol (Calc): 86 mg/dL (calc) (ref <130)
Total CHOL/HDL Ratio: 2.4 (calc) (ref <5.0)
Triglycerides: 123 mg/dL (ref <150)

## 2020-09-17 LAB — TSH: TSH: 2.24 mIU/L (ref 0.40–4.50)

## 2020-09-17 LAB — HEMOGLOBIN A1C
Hgb A1c MFr Bld: 7.7 % of total Hgb — ABNORMAL HIGH (ref <5.7)
Mean Plasma Glucose: 174 mg/dL
eAG (mmol/L): 9.7 mmol/L

## 2020-09-17 LAB — MAGNESIUM: Magnesium: 2.1 mg/dL (ref 1.5–2.5)

## 2020-09-19 DIAGNOSIS — R634 Abnormal weight loss: Secondary | ICD-10-CM | POA: Diagnosis not present

## 2020-09-25 LAB — OVA AND PARASITE EXAMINATION
CONCENTRATE RESULT:: NONE SEEN
MICRO NUMBER:: 12070139
SPECIMEN QUALITY:: ADEQUATE
TRICHROME RESULT:: NONE SEEN

## 2020-10-10 ENCOUNTER — Other Ambulatory Visit: Payer: Self-pay | Admitting: Adult Health

## 2020-10-10 DIAGNOSIS — Z1231 Encounter for screening mammogram for malignant neoplasm of breast: Secondary | ICD-10-CM

## 2020-10-10 DIAGNOSIS — E2839 Other primary ovarian failure: Secondary | ICD-10-CM

## 2020-10-31 ENCOUNTER — Ambulatory Visit
Admission: RE | Admit: 2020-10-31 | Discharge: 2020-10-31 | Disposition: A | Discharge status: Home / Self Care | Payer: Medicare PPO | Source: Ambulatory Visit | Attending: Adult Health | Admitting: Adult Health

## 2020-10-31 ENCOUNTER — Other Ambulatory Visit: Payer: Self-pay

## 2020-10-31 DIAGNOSIS — Z1231 Encounter for screening mammogram for malignant neoplasm of breast: Secondary | ICD-10-CM

## 2020-10-31 DIAGNOSIS — Z780 Asymptomatic menopausal state: Secondary | ICD-10-CM | POA: Diagnosis not present

## 2020-10-31 DIAGNOSIS — E2839 Other primary ovarian failure: Secondary | ICD-10-CM

## 2020-10-31 DIAGNOSIS — M8589 Other specified disorders of bone density and structure, multiple sites: Secondary | ICD-10-CM | POA: Diagnosis not present

## 2020-11-04 ENCOUNTER — Other Ambulatory Visit: Payer: Self-pay | Admitting: Adult Health

## 2020-11-04 ENCOUNTER — Encounter: Payer: Self-pay | Admitting: Adult Health

## 2020-11-04 DIAGNOSIS — M8580 Other specified disorders of bone density and structure, unspecified site: Secondary | ICD-10-CM | POA: Insufficient documentation

## 2020-11-04 DIAGNOSIS — R928 Other abnormal and inconclusive findings on diagnostic imaging of breast: Secondary | ICD-10-CM

## 2020-11-19 ENCOUNTER — Other Ambulatory Visit: Payer: Self-pay

## 2020-11-19 DIAGNOSIS — N183 Chronic kidney disease, stage 3 unspecified: Secondary | ICD-10-CM

## 2020-11-19 DIAGNOSIS — E1122 Type 2 diabetes mellitus with diabetic chronic kidney disease: Secondary | ICD-10-CM

## 2020-11-19 MED ORDER — METFORMIN HCL ER 500 MG PO TB24: ORAL_TABLET | ORAL | 3 refills | Status: DC

## 2020-11-26 ENCOUNTER — Other Ambulatory Visit: Payer: Self-pay

## 2020-11-26 ENCOUNTER — Ambulatory Visit
Admission: RE | Admit: 2020-11-26 | Discharge: 2020-11-26 | Disposition: A | Discharge status: Home / Self Care | Payer: Medicare PPO | Source: Ambulatory Visit | Attending: Adult Health | Admitting: Adult Health

## 2020-11-26 ENCOUNTER — Ambulatory Visit: Payer: Medicare PPO

## 2020-11-26 DIAGNOSIS — R922 Inconclusive mammogram: Secondary | ICD-10-CM | POA: Diagnosis not present

## 2020-11-26 DIAGNOSIS — R928 Other abnormal and inconclusive findings on diagnostic imaging of breast: Secondary | ICD-10-CM

## 2020-12-12 ENCOUNTER — Other Ambulatory Visit: Payer: Self-pay | Admitting: Adult Health

## 2020-12-17 ENCOUNTER — Encounter: Payer: Self-pay | Admitting: Internal Medicine

## 2020-12-17 NOTE — Patient Instructions (Signed)

## 2020-12-17 NOTE — Progress Notes (Signed)
Future Appointments  Date Time Provider Department Center  12/18/2020 10:30 AM [name], MD [facility] None  05/14/2021    -      CPE  2:00 PM Judd Gaudier, NP [facility] None  09/16/2021     -    Wellness 10:30 AM Judd Gaudier, NP [facility] None    History of Present Illness:       This very nice 75 y.o.  MWF  presents for 3 month follow up with HTN, HLD, T2_NIDDM/CKD3, Hypothyroidism and Vitamin D Deficiency.        Patient also is c/o sx's consistent with OAB & requests treatment & Urology Referral.      Patient is treated for HTN (2007) & BP has been controlled at home. Today's BP is at goal - 122/70. Patient has had no complaints of any cardiac type chest pain, palpitations, dyspnea / orthopnea / PND, dizziness, claudication, or dependent edema.       Hyperlipidemia is controlled with diet & meds. Patient denies myalgias or other med SE's. Last Lipids were mat goal:  Lab Results  Component Value Date   CHOL 146 09/16/2020   HDL 60 09/16/2020   LDLCALC 66 09/16/2020   TRIG 123 09/16/2020   CHOLHDL 2.4 09/16/2020     Also, the patient has history of T2_NIDDM (2006) w/CKD3 a and has had no symptoms of reactive hypoglycemia, diabetic polys, paresthesias or visual blurring.  Last A1c was not at goal:  Lab Results  Component Value Date   HGBA1C 7.7 (H) 09/16/2020            Patient was dx'd Hypothyroid circa 2008 & has been on replacement since.                                                       Further, the patient also has history of Vitamin D Deficiency and supplements vitamin D without any suspected side-effects. Last vitamin D was still low:  Lab Results  Component Value Date   VD25OH 42 05/14/2020     Current Outpatient Medications on File Prior to Visit  Medication Sig   albuterol HFA inhaler Inhale 2 puffs  every 6 (six) hours as needed for wheezing or shortness of breath (cough).   amLODipine (NORVASC) 5 MG tablet Take 1 tablet  Daily for BP   atorvastatin (LIPITOR) 40 MG tablet Take 1 tablet Daily for Cholesterol   azelastine (ASTELIN) 0.1 % nasal spray Place 2 sprays into both nostrils 2 (two) times daily. Use in each nostril as directed   Biotin 10 MG TABS Take by mouth daily.   Calcium Carbonate-Vitamin D (CALCIUM-VITAMIN D) 500-200 MG-UNIT per tablet Take 1 tablet by mouth 2 (two) times daily with a meal.   CINNAMON PO Take 1,000 Units by mouth daily. 2 capsules daily   FLONASE nasal spray Place 2 sprays into both nostrils daily.   glipiZIDE 10 MG tablet TAKE 1 TABLET BY MOUTH 3  TIMES DAILY WITH MEALS   JARDIANCE 25 MG TABS tablet START 1/2 TAB DAILY FOR 1 MONTH THEN INCREASE TO 1 TAB DAILY.   levothyroxine (SYNTHROID) 50 MCG tablet TAKE 1 TABLET DAILY    lisinopril-hctz 20-25 MG tablet Take 1/2-1 tablet Daily for BP   Magnesium 250 MG TABS Take 250 mg by  mouth daily.   metFORMIN -XR 500 MG 24 hr tablet Take 2 tablets 2 x /day with Meals for Diabetes   promethazine--DM) 6.25-15 MG/5ML syrup Take 5 mLs by mouth 4 (four) times daily as needed for cough.   traMADol (ULTRAM) 50 MG tablet Take 1 tab as needed up to every 6 hours for severe pain.   traZODone (DESYREL) 150 MG tablet Take 1 tablet (150 mg total) by mouth at bedtime. Take 1/2 to 1 tablet at bedtime as needed for sleep    No Known Allergies  PMHx:   Past Medical History:  Diagnosis Date   Diabetes mellitus    Headache(784.0)    hx migraines   Hyperlipidemia    Hypertension    Hypothyroidism     Immunization History  Administered Date(s) Administered   Influenza Split 02/13/2013   Influenza, High Dose  12/07/2014, 12/09/2015, 12/15/2016, 02/08/2018, 12/16/2018   Influenza 03/26/2014   PFIZER SARS-COV-2 Vacc 04/20/2019, 05/18/2019   Pneumococcal -13 12/09/2015   Pneumococcal-23 03/23/2008   Tdap 07/17/2010   Zoster Recombinat (Shingrix) 12/16/2018, 04/06/2019   Zoster, Live 01/14/2012     Past Surgical History:  Procedure Laterality  Date   ANTERIOR CERVICAL DECOMP/DISCECTOMY FUSION  10/22/2011   Procedure: ANTERIOR CERVICAL DECOMPRESSION/DISCECTOMY FUSION 1 LEVEL;  Surgeon: Emilee Hero, MD;  Location: [facility] OR;  Service: Orthopedics;  Laterality: Right;  Anterior cervical decompression fusion, cervical 5-6 with instrumentation and allograft.   BREAST EXCISIONAL BIOPSY Left    30 years ago - benign   BREAST SURGERY  80's   lft cyst    FHx:    Reviewed / unchanged  SHx:    Reviewed / unchanged   Systems Review:  Constitutional: Denies fever, chills, wt changes, headaches, insomnia, fatigue, night sweats, change in appetite. Eyes: Denies redness, blurred vision, diplopia, discharge, itchy, watery eyes.  ENT: Denies discharge, congestion, post nasal drip, epistaxis, sore throat, earache, hearing loss, dental pain, tinnitus, vertigo, sinus pain, snoring.  CV: Denies chest pain, palpitations, irregular heartbeat, syncope, dyspnea, diaphoresis, orthopnea, PND, claudication or edema. Respiratory: denies cough, dyspnea, DOE, pleurisy, hoarseness, laryngitis, wheezing.  Gastrointestinal: Denies dysphagia, odynophagia, heartburn, reflux, water brash, abdominal pain or cramps, nausea, vomiting, bloating, diarrhea, constipation, hematemesis, melena, hematochezia  or hemorrhoids. Genitourinary: Denies dysuria, frequency, urgency, nocturia, hesitancy, discharge, hematuria or flank pain. Musculoskeletal: Denies arthralgias, myalgias, stiffness, jt. swelling, pain, limping or strain/sprain.  Skin: Denies pruritus, rash, hives, warts, acne, eczema or change in skin lesion(s). Neuro: No weakness, tremor, incoordination, spasms, paresthesia or pain. Psychiatric: Denies confusion, memory loss or sensory loss. Endo: Denies change in weight, skin or hair change.  Heme/Lymph: No excessive bleeding, bruising or enlarged lymph nodes.  Physical Exam  BP 122/70   Pulse 61   Temp (!) 97.3 F (36.3 C)   Resp 16   Ht 5' 3.5" (1.613  m)   Wt 118 lb 3.2 oz (53.6 kg)   SpO2 96%   BMI 20.61 kg/m   Appears  well nourished, well groomed  and in no distress.  Eyes: PERRLA, EOMs, conjunctiva no swelling or erythema. Sinuses: No frontal/maxillary tenderness ENT/Mouth: EAC's clear, TM's nl w/o erythema, bulging. Nares clear w/o erythema, swelling, exudates. Oropharynx clear without erythema or exudates. Oral hygiene is good. Tongue normal, non obstructing. Hearing intact.  Neck: Supple. Thyroid not palpable. Car 2+/2+ without bruits, nodes or JVD. Chest: Respirations nl with BS clear & equal w/o rales, rhonchi, wheezing or stridor.  Cor: Heart sounds normal w/ regular rate and  rhythm without sig. murmurs, gallops, clicks or rubs. Peripheral pulses normal and equal  without edema.  Abdomen: Soft & bowel sounds normal. Non-tender w/o guarding, rebound, hernias, masses or organomegaly.  Lymphatics: Unremarkable.  Musculoskeletal: Full ROM all peripheral extremities, joint stability, 5/5 strength and normal gait.  Skin: Warm, dry without exposed rashes, lesions or ecchymosis apparent.  Neuro: Cranial nerves intact, reflexes equal bilaterally. Sensory-motor testing grossly intact. Tendon reflexes grossly intact.  Pysch: Alert & oriented x 3.  Insight and judgement nl & appropriate. No ideations.  Assessment and Plan:  1. Essential hypertension  - Continue medication, monitor blood pressure at home.  - Continue DASH diet.  Reminder to go to the ER if any CP,  SOB, nausea, dizziness, severe HA, changes vision/speech.   - CBC with Differential/Platelet - COMPLETE METABOLIC PANEL WITH GFR - Magnesium - TSH  2. Hyperlipidemia associated with type 2 diabetes mellitus (HCC)  - Continue diet/meds, exercise,& lifestyle modifications.  - Continue monitor periodic cholesterol/liver & renal functions    - Lipid panel - TSH  3. Type 2 diabetes mellitus with stage 3a chronic kidney  disease, without long-term current use of  insulin (HCC)  - Continue diet, exercise  - Lifestyle modifications.  - Monitor appropriate labs   - Fructosamine - Insulin, random  4. Vitamin D deficiency  - Continue supplementation.   - VITAMIN D 25 Hydroxy   5. Hypothyroidism, - TSH  6. OAB (overactive bladder)  - Sx's - Myrbetriq 25 mg #14 and 50 mg #14   - Ambulatory referral to Urology  7. Cough  - promethazine-dextromethorphan (PROMETHAZINE-DM) 6.25-15 MG/5ML syrup; Take 1 teaspoonful every 4 hours as needed for cough  Dispense: 360 mL; Refill: 3  8. Medication management  - CBC with Differential/Platelet - COMPLETE METABOLIC PANEL WITH GFR - Magnesium - Lipid panel - TSH - Fructosamine - Insulin, random - VITAMIN D 25 Hydroxy           Discussed  regular exercise, BP monitoring, weight control to achieve/maintain BMI less than 25 and discussed med and SE's. Recommended labs to assess and monitor clinical status with further disposition pending results of labs.  I discussed the assessment and treatment plan with the patient. The patient was provided an opportunity to ask questions and all were answered. The patient agreed with the plan and demonstrated an understanding of the instructions.  I provided over 30 minutes of exam, counseling, chart review and  complex critical decision making.        The patient was advised to call back or seek an in-person evaluation if the symptoms worsen or if the condition fails to improve as anticipated.   Marinus Maw, MD

## 2020-12-18 ENCOUNTER — Ambulatory Visit: Payer: Medicare PPO | Admitting: Internal Medicine

## 2020-12-18 ENCOUNTER — Encounter: Payer: Self-pay | Admitting: Internal Medicine

## 2020-12-18 ENCOUNTER — Other Ambulatory Visit: Payer: Self-pay

## 2020-12-18 VITALS — BP 122/70 | HR 61 | Temp 97.3°F | Resp 16 | Ht 63.5 in | Wt 118.2 lb

## 2020-12-18 DIAGNOSIS — N1831 Chronic kidney disease, stage 3a: Secondary | ICD-10-CM

## 2020-12-18 DIAGNOSIS — E785 Hyperlipidemia, unspecified: Secondary | ICD-10-CM | POA: Diagnosis not present

## 2020-12-18 DIAGNOSIS — E039 Hypothyroidism, unspecified: Secondary | ICD-10-CM

## 2020-12-18 DIAGNOSIS — E1169 Type 2 diabetes mellitus with other specified complication: Secondary | ICD-10-CM

## 2020-12-18 DIAGNOSIS — R059 Cough, unspecified: Secondary | ICD-10-CM

## 2020-12-18 DIAGNOSIS — Z79899 Other long term (current) drug therapy: Secondary | ICD-10-CM | POA: Diagnosis not present

## 2020-12-18 DIAGNOSIS — E559 Vitamin D deficiency, unspecified: Secondary | ICD-10-CM | POA: Diagnosis not present

## 2020-12-18 DIAGNOSIS — N3281 Overactive bladder: Secondary | ICD-10-CM | POA: Diagnosis not present

## 2020-12-18 DIAGNOSIS — I10 Essential (primary) hypertension: Secondary | ICD-10-CM

## 2020-12-18 DIAGNOSIS — E1122 Type 2 diabetes mellitus with diabetic chronic kidney disease: Secondary | ICD-10-CM

## 2020-12-18 MED ORDER — PROMETHAZINE-DM 6.25-15 MG/5ML PO SYRP: ORAL_SOLUTION | ORAL | 3 refills | Status: DC

## 2020-12-19 NOTE — Progress Notes (Signed)
============================================================ -   Test results slightly outside the reference range are not unusual. If there is anything important, I will review this with you,  otherwise it is considered normal test values.  If you have further questions,  please do not hesitate to contact me at the office or via My Chart.  ============================================================ ============================================================  -  Kidney functions - Still Stage 3a   & Stable  ============================================================ ============================================================  -  Total Chol = 180    &   LDL  Chol = 96  - Both Great  ============================================================ ============================================================  -  Vitamin D = 49 - Still a little Low   - Vitamin D goal is between 70-100.   - Please INCREASE  Vitamin D & take an extra 2,000 units /day  - It is very important as a natural anti-inflammatory and helping the  immune system protect against viral infections, like the Covid-19    helping hair, skin, and nails, as well as reducing stroke and  heart attack risk.   - It helps your bones and helps with mood.  - It also decreases numerous cancer risks so please  take it as directed.   - Low Vit D is associated with a 200-300% higher risk for  CANCER   and 200-300% higher risk for HEART   ATTACK  &  STROKE.    - It is also associated with higher death rate at younger ages,   autoimmune diseases like Rheumatoid arthritis, Lupus,  Multiple Sclerosis.     - Also many other serious conditions, like depression, Alzheimer's  Dementia, infertility, muscle aches, fatigue, fibromyalgia   - just to name a few. ============================================================ ============================================================  -  All Else - CBC - Kidneys - Electrolytes -  Liver - Magnesium & Thyroid    - all  Normal / OK ===========================================================

## 2020-12-22 LAB — VITAMIN D 25 HYDROXY (VIT D DEFICIENCY, FRACTURES): Vit D, 25-Hydroxy: 49 ng/mL (ref 30–100)

## 2020-12-22 LAB — COMPLETE METABOLIC PANEL WITH GFR
AG Ratio: 1.6 (calc) (ref 1.0–2.5)
ALT: 21 U/L (ref 6–29)
AST: 19 U/L (ref 10–35)
Albumin: 4.3 g/dL (ref 3.6–5.1)
Alkaline phosphatase (APISO): 65 U/L (ref 37–153)
BUN/Creatinine Ratio: 22 (calc) (ref 6–22)
BUN: 26 mg/dL — ABNORMAL HIGH (ref 7–25)
CO2: 26 mmol/L (ref 20–32)
Calcium: 10.4 mg/dL (ref 8.6–10.4)
Chloride: 103 mmol/L (ref 98–110)
Creat: 1.2 mg/dL — ABNORMAL HIGH (ref 0.60–1.00)
Globulin: 2.7 g/dL (calc) (ref 1.9–3.7)
Glucose, Bld: 158 mg/dL — ABNORMAL HIGH (ref 65–99)
Potassium: 4.6 mmol/L (ref 3.5–5.3)
Sodium: 141 mmol/L (ref 135–146)
Total Bilirubin: 0.4 mg/dL (ref 0.2–1.2)
Total Protein: 7 g/dL (ref 6.1–8.1)
eGFR: 48 mL/min/{1.73_m2} — ABNORMAL LOW (ref >=60)

## 2020-12-22 LAB — CBC WITH DIFFERENTIAL/PLATELET
Absolute Monocytes: 403 cells/uL (ref 200–950)
Basophils Absolute: 40 cells/uL (ref 0–200)
Basophils Relative: 0.5 %
Eosinophils Absolute: 119 cells/uL (ref 15–500)
Eosinophils Relative: 1.5 %
HCT: 45.6 % — ABNORMAL HIGH (ref 35.0–45.0)
Hemoglobin: 15 g/dL (ref 11.7–15.5)
Lymphs Abs: 1738 cells/uL (ref 850–3900)
MCH: 29.1 pg (ref 27.0–33.0)
MCHC: 32.9 g/dL (ref 32.0–36.0)
MCV: 88.5 fL (ref 80.0–100.0)
MPV: 11 fL (ref 7.5–12.5)
Monocytes Relative: 5.1 %
Neutro Abs: 5601 cells/uL (ref 1500–7800)
Neutrophils Relative %: 70.9 %
Platelets: 345 10*3/uL (ref 140–400)
RBC: 5.15 10*6/uL — ABNORMAL HIGH (ref 3.80–5.10)
RDW: 12.9 % (ref 11.0–15.0)
Total Lymphocyte: 22 %
WBC: 7.9 10*3/uL (ref 3.8–10.8)

## 2020-12-22 LAB — LIPID PANEL
Cholesterol: 180 mg/dL (ref <200)
HDL: 61 mg/dL (ref >=50)
LDL Cholesterol (Calc): 96 mg/dL (calc)
Non-HDL Cholesterol (Calc): 119 mg/dL (calc) (ref <130)
Total CHOL/HDL Ratio: 3 (calc) (ref <5.0)
Triglycerides: 134 mg/dL (ref <150)

## 2020-12-22 LAB — TSH: TSH: 3.56 mIU/L (ref 0.40–4.50)

## 2020-12-22 LAB — MAGNESIUM: Magnesium: 2.2 mg/dL (ref 1.5–2.5)

## 2020-12-22 LAB — FRUCTOSAMINE: Fructosamine: 339 umol/L — ABNORMAL HIGH (ref 205–285)

## 2020-12-22 LAB — INSULIN, RANDOM: Insulin: 3.8 u[IU]/mL

## 2020-12-22 NOTE — Progress Notes (Signed)
============================================================ ============================================================  -    Fructosamine finally returned &   - Fructosamine = 339 mg%  which approximates an A1c 8.2%                                                                                        - which is way too high   Being diabetic has a  300% increased risk for heart attack,                                      stroke, cancer, and alzheimer- type vascular dementia.   It is very important that you work harder with diet by                      avoiding all foods that are white except chicken, fish & calliflower.  - Avoid white rice  (brown & wild rice is OK),   - Avoid white potatoes  (sweet potatoes in moderation is OK),   White bread or wheat bread or anything made out of   white flour like bagels, donuts, rolls, buns, biscuits, cakes,  - pastries, cookies, pizza crust, and pasta (made from  white flour & egg whites)   - vegetarian pasta or spinach or wheat pasta is OK.  - Multigrain breads like Arnold's, Pepperidge Farm or   multigrain sandwich thins or high fiber breads like   [place]ka bread or "Dave's Killer" breads that are  4 to 5 grams fiber per slice !  are best.    Diet, exercise and weight loss can reverse and cure  diabetes in the early stages.    - Diet, exercise and weight loss is very important in the   control and prevention of complications of diabetes which  affects every system in your body, ie.   -Brain - dementia/stroke,  - eyes - glaucoma/blindness,  - heart - heart attack/heart failure,  - kidneys - dialysis,  - stomach - gastric paralysis,  - intestines - malabsorption,  - nerves - severe painful neuritis,  - circulation - gangrene & loss of a leg(s)  - and finally  . . . . . . . . . . . . . . . . . .    - cancer and  Alzheimers. ============================================================ ============================================================

## 2020-12-31 NOTE — Progress Notes (Signed)
01/02/21 3:16 PM   Elizabeth May 1945/07/27 [id number]  Referring provider:  [name], MD [address] [place],  [place] [address] Chief Complaint  Patient presents with   Over Active Bladder     HPI: Elizabeth May is a 75 y.o.female for further evaluation of OAB.   Her symptoms were previously followed by PCP Dr. Oneta May. She was placed on Mybetriq 25 mg and referred to urology.   She has urinary frequency, urgency, and leakage.   She reports today that her urinary symptoms have been ongoing for several years. She wears 3-4 pads daily with mild wetness.   She experiences leakage when laughing, coughing, sneezing, etc. She experiences dribbles. She has not tried pelvic floor/ Kegel exercises.   She has no issues with constipation or vaginal symptoms.   She reports that she tried Myrbetriq for 6-7 days and she has noticed a bit of improvement.   She is taking the 25 mg dose.  She has 1 more 7 pack of this as well as a 14-day supply of Myrbetriq 50 mg.   PMH: Past Medical History:  Diagnosis Date   Diabetes mellitus    Headache(784.0)    hx migraines   Hyperlipidemia    Hypertension    Hypothyroidism     Surgical History: Past Surgical History:  Procedure Laterality Date   ANTERIOR CERVICAL DECOMP/DISCECTOMY FUSION  10/22/2011   Procedure: ANTERIOR CERVICAL DECOMPRESSION/DISCECTOMY FUSION 1 LEVEL;  Surgeon: Elizabeth Hero, MD;  Location: [facility];  Service: Orthopedics;  Laterality: Right;  Anterior cervical decompression fusion, cervical 5-6 with instrumentation and allograft.   BREAST EXCISIONAL BIOPSY Left    30 years ago - benign   BREAST SURGERY  80's   lft cyst    Home Medications:  Allergies as of 01/01/2021   No Known Allergies      Medication List        Accurate as of January 01, 2021 11:59 PM. If you have any questions, ask your nurse or doctor.          albuterol 108 (90 Base) MCG/ACT  inhaler Commonly known as: VENTOLIN HFA Inhale 2 puffs into the lungs every 6 (six) hours as needed for wheezing or shortness of breath (cough).   amLODipine 5 MG tablet Commonly known as: NORVASC Take 1 tablet Daily for BP   atorvastatin 40 MG tablet Commonly known as: LIPITOR Take 1 tablet Daily for Cholesterol   azelastine 0.1 % nasal spray Commonly known as: ASTELIN Place 2 sprays into both nostrils 2 (two) times daily. Use in each nostril as directed   Biotin 10 MG Tabs Take by mouth daily.   calcium-vitamin D 500-200 MG-UNIT tablet Take 1 tablet by mouth 2 (two) times daily with a meal.   CINNAMON PO Take 1,000 Units by mouth daily. 2 capsules daily   fluticasone 50 MCG/ACT nasal spray Commonly known as: FLONASE Place 2 sprays into both nostrils daily.   Gemtesa 75 MG Tabs Generic drug: Vibegron Take 75 mg by mouth daily. Started by: Elizabeth Scotland, MD   glipiZIDE 10 MG tablet Commonly known as: GLUCOTROL TAKE 1 TABLET BY MOUTH 3  TIMES DAILY WITH MEALS   Jardiance 25 MG Tabs tablet Generic drug: empagliflozin START 1/2 TAB DAILY FOR 1 MONTH THEN INCREASE TO 1 TAB DAILY.   levothyroxine 50 MCG tablet Commonly known as: SYNTHROID TAKE 1 TABLET DAILY ON AN EMPTY STOMACH WITH ONLY WATER FOR 30 MINUTES   lisinopril-hydrochlorothiazide 20-25  MG tablet Commonly known as: ZESTORETIC Take 1/2-1 tablet Daily for BP   Magnesium 250 MG Tabs Take 250 mg by mouth daily.   metFORMIN 500 MG 24 hr tablet Commonly known as: GLUCOPHAGE-XR Take 2 tablets 2 x /day with Meals for Diabetes   promethazine-dextromethorphan 6.25-15 MG/5ML syrup Commonly known as: PROMETHAZINE-DM Take 1 teaspoonful every 4 hours as needed for cough   traMADol 50 MG tablet Commonly known as: ULTRAM Take 1 tab as needed up to every 6 hours for severe pain.   traZODone 150 MG tablet Commonly known as: DESYREL Take 1 tablet (150 mg total) by mouth at bedtime. Take 1/2 to 1 tablet at bedtime  as needed for sleep        Allergies: No Known Allergies  Family History: Family History  Problem Relation Age of Onset   Hypertension Mother    Hyperlipidemia Mother    Heart disease Mother    Alzheimer's disease Father    Drug abuse Maternal Grandmother    Diabetes Maternal Grandmother    Diabetes Maternal Aunt    Diabetes Maternal Uncle    Alzheimer's disease Paternal Uncle    Alzheimer's disease Paternal Aunt     Social History:  reports that she has never smoked. She has never used smokeless tobacco. She reports current alcohol use of about 7.0 standard drinks per week. She reports that she does not use drugs.   Physical Exam: BP 136/74   Ht 5' 3.5" (1.613 m)   Wt 118 lb (53.5 kg)   BMI 20.57 kg/m   Constitutional:  Alert and oriented, No acute distress. HEENT: [place] AT, moist mucus membranes.  Trachea midline, no masses. Cardiovascular: No clubbing, cyanosis, or edema. Respiratory: Normal respiratory effort, no increased work of breathing. Skin: No rashes, bruises or suspicious lesions. Neurologic: Grossly intact, no focal deficits, moving all 4 extremities. Psychiatric: Normal mood and affect.  Laboratory Data:  Lab Results  Component Value Date   CREATININE 1.20 (H) 12/18/2020    Lab Results  Component Value Date   HGBA1C 7.7 (H) 09/16/2020    Pertinent Imaging: PVR 0  UA negative, see epic  Assessment & Plan:    Mixed urinary incontinence - Has seen slight improvement on Myrbetriq  - Continue low dose (yellow box) Myrbetriq and finish then continue high dose (green box).  - Gemtesa 75 mg month supply was also given today in sample form, advised to try this after trying the Myrbetriq to see if its more effective or if the Elizabeth May is not effective. - Counseled her in the importance of pelvic floor exercises primarily Kegel exercises. -She would like to be referred to physical therapy - Discussed surgeries for female incontinence. If her symptoms  worsen or do not improve will further discuss this option with Dr. Sherron Monday. She is not opposed to surgery.    Follow-up in 3 months for recheck of symptoms  Elizabeth May as a scribe for Elizabeth Scotland, MD.,have documented all relevant documentation on the behalf of Elizabeth Scotland, MD,as directed by  Elizabeth Scotland, MD while in the presence of Elizabeth Scotland, MD.  I have reviewed the above documentation for accuracy and completeness, and I agree with the above.   Elizabeth Scotland, MD   [facility] [address] [place], [place] [address] [phone]

## 2021-01-01 ENCOUNTER — Other Ambulatory Visit: Payer: Self-pay

## 2021-01-01 ENCOUNTER — Ambulatory Visit: Payer: Medicare PPO | Admitting: Urology

## 2021-01-01 ENCOUNTER — Encounter: Payer: Self-pay | Admitting: Urology

## 2021-01-01 VITALS — BP 136/74 | Ht 63.5 in | Wt 118.0 lb

## 2021-01-01 DIAGNOSIS — N3281 Overactive bladder: Secondary | ICD-10-CM

## 2021-01-01 MED ORDER — GEMTESA 75 MG PO TABS: 75.0000 mg | ORAL_TABLET | Freq: Every day | ORAL | 0 refills | Status: DC

## 2021-01-01 NOTE — Patient Instructions (Signed)
Pelvic Floor Dysfunction Pelvic floor dysfunction (PFD) is a condition that results when the group of muscles and connective tissues that support the organs in the pelvis (pelvic floor muscles) do not work well. These muscles and their connections form a sling that supports the colon and bladder. In men, these muscles also support the prostate gland. In women, they also support the uterus. PFD causes pelvic floor muscles to be too weak, too tight, or a combination of both. In PFD, muscle movements are not coordinated. This condition may cause bowel or bladder problems. It may also cause pain. What are the causes? This condition may be caused by an injury to the pelvic area or by a weakening of pelvic muscles. This often results from pregnancy and childbirth or other types of strain. In many cases, the exact cause is not known. What increases the risk? The following factors may make you more likely to develop this condition: Having a condition of chronic bladder tissue inflammation (interstitial cystitis). Being an older person. Being overweight. Radiation treatment for cancer in the pelvic region. Previous pelvic surgery, such as removal of the uterus (hysterectomy) or prostate gland (prostatectomy). What are the signs or symptoms? Symptoms of this condition vary and may include: Bladder symptoms, such as: Trouble starting urination and emptying the bladder. Frequent urinary tract infections. Leaking urine when coughing, laughing, or exercising (stress incontinence). Having to pass urine urgently or frequently. Pain when passing urine. Bowel symptoms, such as: Constipation. Urgent or frequent bowel movements. Incomplete bowel movements. Painful bowel movements. Leaking stool or gas. Unexplained genital or rectal pain. Genital or rectal muscle spasms. Low back pain. In women, symptoms of PFD may also include: A heavy, full, or aching feeling in the vagina. A bulge that protrudes into  the vagina. Pain during or after sexual intercourse. How is this diagnosed? This condition may be diagnosed based on: Your symptoms and medical history. A physical exam. During the exam, your health care provider may check your pelvic muscles for tightness, spasm, pain, or weakness. This may include a rectal exam and a pelvic exam for women. In some cases, you may have diagnostic tests, such as: Electrical muscle function tests. Urine flow testing. X-ray tests of bowel function. Ultrasound of the pelvic organs. How is this treated? Treatment for this condition depends on your symptoms. Treatment options include: Physical therapy. This may include Kegel exercises to help relax or strengthen the pelvic floor muscles. Biofeedback. This type of therapy provides feedback on how tight your pelvic floor muscles are so that you can learn to control them. Internal or external massage therapy. A treatment that involves electrical stimulation of the pelvic floor muscles to help control pain (transcutaneous electrical nerve stimulation, or TENS). Sound wave therapy (ultrasound) to reduce muscle spasms. Medicines, such as: Muscle relaxants. Bladder control medicines. Surgery to reconstruct or support pelvic floor muscles may be an option if other treatments do not help. Follow these instructions at home: Activity Do your usual activities as told by your health care provider. Ask your health care provider if you should modify any activities. Do pelvic floor strengthening or relaxing exercises at home as told by your physical therapist. Lifestyle Maintain a healthy weight. Eat foods that are high in fiber, such as beans, whole grains, and fresh fruits and vegetables. Limit foods that are high in fat and processed sugars, such as fried or sweet foods. Manage stress with relaxation techniques such as yoga or meditation. General instructions If you have problems with leakage:   Use absorbable pads or  wear padded underwear. Wash frequently with mild soap. Keep your genital and anal area as clean and dry as possible. Ask your health care provider if you should try a barrier cream to prevent skin irritation. Take warm baths to relieve pelvic muscle tension or spasms. Take over-the-counter and prescription medicines only as told by your health care provider. Keep all follow-up visits as told by your health care provider. This is important. Contact a health care provider if you: Are not improving with home care. Have signs or symptoms of PFD that get worse at home. Develop new signs or symptoms at home. Have signs of a urinary tract infection, such as: Fever. Chills. Urinary frequency. A burning feeling when urinating. Have not had a bowel movement in 3 days (constipation). Summary Pelvic floor dysfunction results when the muscles and connective tissues in your pelvic floor do not work well. These muscles and their connections form a sling that supports your colon and bladder. In men, these muscles also support the prostate gland. In women, they also support the uterus. PFD may be caused by an injury to the pelvic area or by a weakening of pelvic muscles. PFD causes pelvic floor muscles to be too weak, too tight, or a combination of both. Symptoms may vary from person to person. In most cases, PFD can be treated with physical therapies and medicines. Surgery may be an option if other treatments do not help. This information is not intended to replace advice given to you by your health care provider. Make sure you discuss any questions you have with your health care provider. Document Revised: 09/27/2017 Document Reviewed: 09/27/2017 Elsevier Patient Education  2022 [employer]  

## 2021-01-08 LAB — URINALYSIS, COMPLETE
Bilirubin, UA: NEGATIVE
Leukocytes,UA: NEGATIVE
Nitrite, UA: NEGATIVE
Protein,UA: NEGATIVE
RBC, UA: NEGATIVE
Specific Gravity, UA: 1.02 (ref 1.005–1.030)
Urobilinogen, Ur: 0.2 mg/dL (ref 0.2–1.0)
pH, UA: 5 (ref 5.0–7.5)

## 2021-01-08 LAB — MICROSCOPIC EXAMINATION
Bacteria, UA: NONE SEEN
RBC, Urine: NONE SEEN /hpf (ref 0–2)

## 2021-02-04 ENCOUNTER — Other Ambulatory Visit: Payer: Self-pay | Admitting: Adult Health

## 2021-02-04 DIAGNOSIS — I10 Essential (primary) hypertension: Secondary | ICD-10-CM

## 2021-02-17 ENCOUNTER — Other Ambulatory Visit: Payer: Self-pay

## 2021-02-17 DIAGNOSIS — E039 Hypothyroidism, unspecified: Secondary | ICD-10-CM

## 2021-02-17 MED ORDER — LEVOTHYROXINE SODIUM 50 MCG PO TABS: ORAL_TABLET | ORAL | 1 refills | Status: DC

## 2021-04-07 NOTE — Progress Notes (Incomplete)
04/07/21 4:02 PM   Elizabeth May Jul 04, 1945 [id number]  Referring provider:  [name], MD [address] [place],  [place] [address] No chief complaint on file.    HPI: Elizabeth May is a 76 y.o.female with a personal history of mixed urinary incontinence, who presents today for 3 month follow-up on symptoms.   Her symptoms were previously followed by PCP Dr. Oneta Rack. She was placed on Mybetriq 25 mg and referred to urology.   She is currently managed on Gemtesa.      PMH: Past Medical History:  Diagnosis Date   Diabetes mellitus    Headache(784.0)    hx migraines   Hyperlipidemia    Hypertension    Hypothyroidism     Surgical History: Past Surgical History:  Procedure Laterality Date   ANTERIOR CERVICAL DECOMP/DISCECTOMY FUSION  10/22/2011   Procedure: ANTERIOR CERVICAL DECOMPRESSION/DISCECTOMY FUSION 1 LEVEL;  Surgeon: Emilee Hero, MD;  Location: [facility];  Service: Orthopedics;  Laterality: Right;  Anterior cervical decompression fusion, cervical 5-6 with instrumentation and allograft.   BREAST EXCISIONAL BIOPSY Left    30 years ago - benign   BREAST SURGERY  80's   lft cyst    Home Medications:  Allergies as of 04/08/2021   No Known Allergies      Medication List        Accurate as of April 07, 2021  4:02 PM. If you have any questions, ask your nurse or doctor.          albuterol 108 (90 Base) MCG/ACT inhaler Commonly known as: VENTOLIN HFA Inhale 2 puffs into the lungs every 6 (six) hours as needed for wheezing or shortness of breath (cough).   amLODipine 5 MG tablet Commonly known as: NORVASC TAKE 1 TABLET EVERY DAY FOR BLOOD PRESSURE   atorvastatin 40 MG tablet Commonly known as: LIPITOR Take 1 tablet Daily for Cholesterol   azelastine 0.1 % nasal spray Commonly known as: ASTELIN Place 2 sprays into both nostrils 2 (two) times daily. Use in each nostril as directed   Biotin 10 MG Tabs Take  by mouth daily.   calcium-vitamin D 500-200 MG-UNIT tablet Take 1 tablet by mouth 2 (two) times daily with a meal.   CINNAMON PO Take 1,000 Units by mouth daily. 2 capsules daily   fluticasone 50 MCG/ACT nasal spray Commonly known as: FLONASE Place 2 sprays into both nostrils daily.   Gemtesa 75 MG Tabs Generic drug: Vibegron Take 75 mg by mouth daily.   glipiZIDE 10 MG tablet Commonly known as: GLUCOTROL TAKE 1 TABLET BY MOUTH 3  TIMES DAILY WITH MEALS   Jardiance 25 MG Tabs tablet Generic drug: empagliflozin START 1/2 TAB DAILY FOR 1 MONTH THEN INCREASE TO 1 TAB DAILY.   levothyroxine 50 MCG tablet Commonly known as: SYNTHROID TAKE 1 TABLET DAILY ON AN EMPTY STOMACH WITH ONLY WATER FOR 30 MINUTES   lisinopril-hydrochlorothiazide 20-25 MG tablet Commonly known as: ZESTORETIC Take 1/2-1 tablet Daily for BP   Magnesium 250 MG Tabs Take 250 mg by mouth daily.   metFORMIN 500 MG 24 hr tablet Commonly known as: GLUCOPHAGE-XR Take 2 tablets 2 x /day with Meals for Diabetes   promethazine-dextromethorphan 6.25-15 MG/5ML syrup Commonly known as: PROMETHAZINE-DM Take 1 teaspoonful every 4 hours as needed for cough   traMADol 50 MG tablet Commonly known as: ULTRAM Take 1 tab as needed up to every 6 hours for severe pain.   traZODone 150 MG tablet Commonly known  as: DESYREL Take 1 tablet (150 mg total) by mouth at bedtime. Take 1/2 to 1 tablet at bedtime as needed for sleep        Allergies: No Known Allergies  Family History: Family History  Problem Relation Age of Onset   Hypertension Mother    Hyperlipidemia Mother    Heart disease Mother    Alzheimer's disease Father    Drug abuse Maternal Grandmother    Diabetes Maternal Grandmother    Diabetes Maternal Aunt    Diabetes Maternal Uncle    Alzheimer's disease Paternal Uncle    Alzheimer's disease Paternal Aunt     Social History:  reports that she has never smoked. She has never used smokeless tobacco.  She reports current alcohol use of about 7.0 standard drinks per week. She reports that she does not use drugs.   Physical Exam: There were no vitals taken for this visit.  Constitutional:  Alert and oriented, No acute distress. HEENT: [place] AT, moist mucus membranes.  Trachea midline, no masses. Cardiovascular: No clubbing, cyanosis, or edema. Respiratory: Normal respiratory effort, no increased work of breathing. Skin: No rashes, bruises or suspicious lesions. Neurologic: Grossly intact, no focal deficits, moving all 4 extremities. Psychiatric: Normal mood and affect.  Laboratory Data:  Lab Results  Component Value Date   CREATININE 1.20 (H) 12/18/2020   Lab Results  Component Value Date   HGBA1C 7.7 (H) 09/16/2020    Urinalysis   Pertinent Imaging:    Assessment & Plan:     No follow-ups on file.  I,Kailey Littlejohn,acting as a Neurosurgeon for Vanna Scotland, MD.,have documented all relevant documentation on the behalf of Vanna Scotland, MD,as directed by  Vanna Scotland, MD while in the presence of Vanna Scotland, MD.   [facility] [address] [place], [place] [address] [phone]

## 2021-04-08 ENCOUNTER — Ambulatory Visit: Payer: Medicare PPO | Admitting: Urology

## 2021-04-21 NOTE — Progress Notes (Signed)
04/22/21 2:18 PM   Elizabeth May 01-Mar-1946 [id number]  Referring provider:  Unk Pinto, MD [address] [place],  [place] [address] Chief Complaint  Patient presents with   Over Active Bladder     HPI: Elizabeth May is a 76 y.o.female with a personal history of mixed urinary incontinence, who presents today for 3 month follow-up.  Since last visit, she is tried both oxybutynin and Myrbetriq at both 25 mg and 50 mg.  She reports that both Myrbetriq 50 mg and Gemtesa 75 mg were effective and reduced her severe urgency and episodes of urge incontinence.  She continues to have some mild stress incontinence.  Overall, she is fairly pleased with the result and would like to continue this medication.   PMH: Past Medical History:  Diagnosis Date   Diabetes mellitus    Headache(784.0)    hx migraines   Hyperlipidemia    Hypertension    Hypothyroidism     Surgical History: Past Surgical History:  Procedure Laterality Date   ANTERIOR CERVICAL DECOMP/DISCECTOMY FUSION  10/22/2011   Procedure: ANTERIOR CERVICAL DECOMPRESSION/DISCECTOMY FUSION 1 LEVEL;  Surgeon: Sinclair Ship, MD;  Location: [facility];  Service: Orthopedics;  Laterality: Right;  Anterior cervical decompression fusion, cervical 5-6 with instrumentation and allograft.   BREAST EXCISIONAL BIOPSY Left    30 years ago - benign   BREAST SURGERY  80's   lft cyst    Home Medications:  Allergies as of 04/22/2021   No Known Allergies      Medication List        Accurate as of April 22, 2021  2:18 PM. If you have any questions, ask your nurse or doctor.          STOP taking these medications    azelastine 0.1 % nasal spray Commonly known as: ASTELIN Stopped by: Hollice Espy, MD   fluticasone 50 MCG/ACT nasal spray Commonly known as: FLONASE Stopped by: Hollice Espy, MD   promethazine-dextromethorphan 6.25-15 MG/5ML syrup Commonly known as:  PROMETHAZINE-DM Stopped by: Hollice Espy, MD       TAKE these medications    albuterol 108 (90 Base) MCG/ACT inhaler Commonly known as: VENTOLIN HFA Inhale 2 puffs into the lungs every 6 (six) hours as needed for wheezing or shortness of breath (cough).   amLODipine 5 MG tablet Commonly known as: NORVASC TAKE 1 TABLET EVERY DAY FOR BLOOD PRESSURE   atorvastatin 40 MG tablet Commonly known as: LIPITOR Take 1 tablet Daily for Cholesterol   Biotin 10 MG Tabs Take by mouth daily.   calcium-vitamin D 500-200 MG-UNIT tablet Take 1 tablet by mouth 2 (two) times daily with a meal.   CINNAMON PO Take 1,000 Units by mouth daily. 2 capsules daily   Gemtesa 75 MG Tabs Generic drug: Vibegron Take 75 mg by mouth daily.   glipiZIDE 10 MG tablet Commonly known as: GLUCOTROL TAKE 1 TABLET BY MOUTH 3  TIMES DAILY WITH MEALS   Jardiance 25 MG Tabs tablet Generic drug: empagliflozin START 1/2 TAB DAILY FOR 1 MONTH THEN INCREASE TO 1 TAB DAILY.   levothyroxine 50 MCG tablet Commonly known as: SYNTHROID TAKE 1 TABLET DAILY ON AN EMPTY STOMACH WITH ONLY WATER FOR 30 MINUTES   lisinopril-hydrochlorothiazide 20-25 MG tablet Commonly known as: ZESTORETIC Take 1/2-1 tablet Daily for BP   Magnesium 250 MG Tabs Take 250 mg by mouth daily.   metFORMIN 500 MG 24 hr tablet Commonly known as: GLUCOPHAGE-XR Take 2 tablets  2 x /day with Meals for Diabetes   traMADol 50 MG tablet Commonly known as: ULTRAM Take 1 tab as needed up to every 6 hours for severe pain.   traZODone 150 MG tablet Commonly known as: DESYREL Take 1 tablet (150 mg total) by mouth at bedtime. Take 1/2 to 1 tablet at bedtime as needed for sleep        Allergies: No Known Allergies  Family History: Family History  Problem Relation Age of Onset   Hypertension Mother    Hyperlipidemia Mother    Heart disease Mother    Alzheimer's disease Father    Drug abuse Maternal Grandmother    Diabetes Maternal  Grandmother    Diabetes Maternal Aunt    Diabetes Maternal Uncle    Alzheimer's disease Paternal Uncle    Alzheimer's disease Paternal Aunt     Social History:  reports that she has never smoked. She has never used smokeless tobacco. She reports current alcohol use of about 7.0 standard drinks per week. She reports that she does not use drugs.   Physical Exam: BP (!) 158/71    Pulse (!) 59    Ht 5' 3.5" (1.613 m)    Wt 123 lb (55.8 kg)    BMI 21.45 kg/m   Constitutional:  Alert and oriented, No acute distress. HEENT: [place] AT, moist mucus membranes.  Trachea midline, no masses. Cardiovascular: No clubbing, cyanosis, or edema. Respiratory: Normal respiratory effort, no increased work of breathing. Skin: No rashes, bruises or suspicious lesions. Neurologic: Grossly intact, no focal deficits, moving all 4 extremities. Psychiatric: Normal mood and affect.  Laboratory Data:  Lab Results  Component Value Date   CREATININE 1.20 (H) 12/18/2020    Lab Results  Component Value Date   HGBA1C 7.7 (H) 09/16/2020    Assessment & Plan:    Mixed urinary incontinence  - Has seen improvement on mybetriq and Gemtesa  - Prescribed Gemtesa 75 mg daily; if she encounters issues getting this medication due to cost recommend she call us.  - Continue physical therapy for Kegel exercises   Return in 1 year for recheck symptoms.   I,Kailey Littlejohn,acting as a Education administrator for Hollice Espy, MD.,have documented all relevant documentation on the behalf of Hollice Espy, MD,as directed by  Hollice Espy, MD while in the presence of Hollice Espy, MD.  I have reviewed the above documentation for accuracy and completeness, and I agree with the above.   Hollice Espy, MD   [facility] [address], [place], [place] [address] [phone]

## 2021-04-22 ENCOUNTER — Other Ambulatory Visit: Payer: Self-pay

## 2021-04-22 ENCOUNTER — Ambulatory Visit: Payer: Medicare PPO | Admitting: Urology

## 2021-04-22 VITALS — BP 158/71 | HR 59 | Ht 63.5 in | Wt 123.0 lb

## 2021-04-22 DIAGNOSIS — N3281 Overactive bladder: Secondary | ICD-10-CM

## 2021-04-22 MED ORDER — GEMTESA 75 MG PO TABS: 75.0000 mg | ORAL_TABLET | Freq: Every day | ORAL | 0 refills | Status: DC

## 2021-04-25 ENCOUNTER — Other Ambulatory Visit: Payer: Self-pay | Admitting: *Deleted

## 2021-04-25 MED ORDER — GEMTESA 75 MG PO TABS: 75.0000 mg | ORAL_TABLET | Freq: Every day | ORAL | 0 refills | Status: DC

## 2021-04-25 NOTE — Telephone Encounter (Signed)
Pt calling stating that rx for Leslye Peer was never called in after OV. rx sent to pharmacy by e-script

## 2021-05-14 ENCOUNTER — Encounter: Payer: Self-pay | Admitting: Adult Health

## 2021-05-14 ENCOUNTER — Ambulatory Visit (INDEPENDENT_AMBULATORY_CARE_PROVIDER_SITE_OTHER): Payer: Medicare PPO | Admitting: Adult Health

## 2021-05-14 ENCOUNTER — Other Ambulatory Visit: Payer: Self-pay

## 2021-05-14 VITALS — BP 110/62 | HR 69 | Temp 97.7°F | Ht 63.5 in | Wt 117.8 lb

## 2021-05-14 DIAGNOSIS — Z0000 Encounter for general adult medical examination without abnormal findings: Secondary | ICD-10-CM | POA: Diagnosis not present

## 2021-05-14 DIAGNOSIS — E1169 Type 2 diabetes mellitus with other specified complication: Secondary | ICD-10-CM

## 2021-05-14 DIAGNOSIS — E039 Hypothyroidism, unspecified: Secondary | ICD-10-CM | POA: Diagnosis not present

## 2021-05-14 DIAGNOSIS — Z6822 Body mass index (BMI) 22.0-22.9, adult: Secondary | ICD-10-CM

## 2021-05-14 DIAGNOSIS — R9431 Abnormal electrocardiogram [ECG] [EKG]: Secondary | ICD-10-CM

## 2021-05-14 DIAGNOSIS — I4510 Unspecified right bundle-branch block: Secondary | ICD-10-CM | POA: Diagnosis not present

## 2021-05-14 DIAGNOSIS — Z23 Encounter for immunization: Secondary | ICD-10-CM

## 2021-05-14 DIAGNOSIS — M8580 Other specified disorders of bone density and structure, unspecified site: Secondary | ICD-10-CM

## 2021-05-14 DIAGNOSIS — N1831 Type 2 diabetes mellitus with diabetic chronic kidney disease: Secondary | ICD-10-CM

## 2021-05-14 DIAGNOSIS — E1122 Type 2 diabetes mellitus with diabetic chronic kidney disease: Secondary | ICD-10-CM | POA: Diagnosis not present

## 2021-05-14 DIAGNOSIS — E559 Vitamin D deficiency, unspecified: Secondary | ICD-10-CM

## 2021-05-14 DIAGNOSIS — E785 Hyperlipidemia, unspecified: Secondary | ICD-10-CM | POA: Diagnosis not present

## 2021-05-14 DIAGNOSIS — Z136 Encounter for screening for cardiovascular disorders: Secondary | ICD-10-CM | POA: Diagnosis not present

## 2021-05-14 DIAGNOSIS — Z79899 Other long term (current) drug therapy: Secondary | ICD-10-CM

## 2021-05-14 DIAGNOSIS — Z0001 Encounter for general adult medical examination with abnormal findings: Secondary | ICD-10-CM | POA: Diagnosis not present

## 2021-05-14 DIAGNOSIS — G4700 Insomnia, unspecified: Secondary | ICD-10-CM

## 2021-05-14 DIAGNOSIS — I10 Essential (primary) hypertension: Secondary | ICD-10-CM | POA: Diagnosis not present

## 2021-05-14 DIAGNOSIS — E538 Deficiency of other specified B group vitamins: Secondary | ICD-10-CM

## 2021-05-14 DIAGNOSIS — N183 Chronic kidney disease, stage 3 unspecified: Secondary | ICD-10-CM | POA: Diagnosis not present

## 2021-05-14 MED ORDER — PROMETHAZINE-DM 6.25-15 MG/5ML PO SYRP: 5.0000 mL | ORAL_SOLUTION | Freq: Four times a day (QID) | ORAL | 1 refills | Status: DC | PRN

## 2021-05-14 MED ORDER — EMPAGLIFLOZIN 25 MG PO TABS: ORAL_TABLET | ORAL | 1 refills | Status: DC

## 2021-05-14 MED ORDER — MIRABEGRON ER 50 MG PO TB24: 50.0000 mg | ORAL_TABLET | Freq: Every day | ORAL | 3 refills | Status: DC

## 2021-05-14 MED ORDER — LISINOPRIL-HYDROCHLOROTHIAZIDE 10-12.5 MG PO TABS: 1.0000 | ORAL_TABLET | Freq: Every day | ORAL | 3 refills | Status: DC

## 2021-05-14 NOTE — Progress Notes (Signed)
Patient ID: Elizabeth May, female   DOB: 1946/02/19, 76 y.o.   MRN: [id number]   CPE  Assessment and Plan    Encounter for Annual Physical Exam with abnormal findings Due annually  Health Maintenance reviewed Healthy lifestyle reviewed and goals set  Send covid 19 [place] information Schedule overdue diabetic eye exam and forward report  Essential hypertension - continue medications, DASH diet, exercise and monitor at home. Call if greater than 130/80.   RBBB  Stable on recent EKG, no concerning sx, monitor   Type 2 diabetes mellitus with stage 3 chronic kidney disease, without long-term current use of insulin ([place]) Discussed general issues about diabetes pathophysiology and management., Educational material distributed., Suggested low cholesterol diet., Encouraged aerobic exercise., Discussed foot care., Reminded to get yearly retinal exam - OVERDUE, she indicates intention to schedule ASAP, report requested -     Hemoglobin A1c  Hyperlipidemia associated with T2DM ([place]) Continue statin for LDL goal <70 check lipids, decrease fatty foods, increase activity.  -     Lipid panel  CKD IIIa associated with T2DM ([facility]) Increase fluids, avoid NSAIDS, monitor sugars, will monitor      -      CMP/GFR  Hypothyroidism, unspecified type -check TSH level, continue medications the same, reminded to take on an empty stomach 30-50mins before food.  -     TSH  Cervical radiculopathy at C6 s/p fusion, takes tramadol PRN, mainly for travel  Vitamin D deficiency Continue supplementation Check vitamin D level  Insomnia - trazodone is beneficial  - good sleep hygiene discussed, increase day time activity  Medication management -     Magnesium  Osteopenia - get dexa q2y, continue Vit D and Ca, weight bearing exercises  Right hand arthritis  Continue tylenol, voltaren or aspercreme 3-4 times daily Managing fairly  Plan hand specialist referral if worsening   Prolonged QT New  finding, discussed risks with trazodone, tramadol and promethazine- DM; advised to reduce trazodone and tramadol while using promethazine DM. Recheck next visit. Avoid further prolonging agents including   Orders Placed This Encounter  Procedures   CBC with Differential/Platelet   COMPLETE METABOLIC PANEL WITH GFR   Magnesium   Lipid panel   TSH   Hemoglobin A1c   VITAMIN D 25 Hydroxy (Vit-D Deficiency, Fractures)   Vitamin B12   Microalbumin / creatinine urine ratio   Urinalysis, Routine w reflex microscopic   EKG 12-Lead   HM DIABETES FOOT EXAM    Over 40 minutes of exam, counseling, chart review and critical decision making was performed Future Appointments  Date Time Provider [place]  09/16/2021 10:30 AM Liane Comber, NP [facility] None  04/23/2022 11:00 AM Vaillancourt, Aldona Bar, PA-C [facility] None  05/14/2022  2:00 PM Liane Comber, NP [facility] None     Plan:   During the course of the visit the patient was educated and counseled about appropriate screening and preventive services including:   Pneumococcal vaccine  Prevnar 13 Influenza vaccine Td vaccine Screening electrocardiogram Bone densitometry screening Colorectal cancer screening Diabetes screening Glaucoma screening Nutrition counseling  Advanced directives: requested    Subjective:  Elizabeth May is a 76 y.o. female who presents for CPE and 3 month follow up. She has Essential hypertension; Cervical radiculopathy at C6; Type 2 diabetes mellitus ([place]); Hypothyroidism; Hyperlipidemia associated with type 2 diabetes mellitus ([place]); Vitamin D deficiency; Medication management; CKD stage 3 due to type 2 diabetes mellitus ([place]); BMI 22.0-22.9, adult; Insomnia; RBBB; Arthritis of right hand; Arthritis  of left hand; B12 deficiency; and Osteopenia on their problem list.   She is married, has 1 adopted daughter, retired Pharmacist, hospital. Spends a lot of time at [place], recently back from  [place].   She is taking trazodone for insomnia occasionally if needed, takes when traveling. She has hx of cervical fusion for radiculopathy at C6; she takes tramadol PRN rarely for pain while on long trips.   She reports injured R hand 01/2020, was having pain in 4th digit, xray showed no fracture but arthritic changes, managing with tylenol, topical voltaren.   She is following with urology Dr. Hollice Espy for OAB, recently given gemtesa but reports very expensive, lack of benefit, would like to switch back to myrbetriq.   BMI is Body mass index is 20.54 kg/m., she has been working on diet and exercise, eating less, walking 1-1.5 hours daily if weather is good. She drinks glass of wine most evenings. Admits needs to increase water intake could be better. Generally eats healthy, minimal starches.  Wt Readings from Last 3 Encounters:  05/14/21 117 lb 12.8 oz (53.4 kg)  04/22/21 123 lb (55.8 kg)  01/01/21 118 lb (53.5 kg)   She has had elevated blood pressure for years. Her blood pressure has been controlled at home, today their BP is BP: 110/62 She does workout. She denies chest pain, shortness of breath, dizziness.    She has diabetes type 2  with CKD IIIa - jardiance, ACEi With hyperlipidemia on lipitor 40 mg not at goal less than 70  with metformin 2000 mg daily, glipizide 10 mg TID, newly on jardiance 25 mg daily  and denies foot ulcerations, hyperglycemia, hypoglycemia , increased appetite, nausea, paresthesia of the feet, polydipsia, polyuria, visual disturbances, vomiting and weight loss.  She reports fasting ranges 120-130 or so but admits not checking daily  Last A1C in the office was:  Lab Results  Component Value Date   HGBA1C 7.7 (H) 09/16/2020   Lab Results  Component Value Date   CHOL 180 12/18/2020   HDL 61 12/18/2020   LDLCALC 96 12/18/2020   TRIG 134 12/18/2020   CHOLHDL 3.0 12/18/2020   She has CKD III associated with T2DM monitored at this office:  Lab  Results  Component Value Date   GFRNONAA 44 (L) 09/16/2020   Patient is on Vitamin D supplement, takes 200 mg BID with calcium.    Lab Results  Component Value Date   VD25OH 79 12/18/2020     She is on thyroid medication. Her medication was not changed last visit. Taking 50 mcg daily.  Lab Results  Component Value Date   TSH 3.56 12/18/2020   She is not currenlty on B12 supplement Lab Results  Component Value Date   VITAMINB12 368 05/14/2020      Medication Review:  Current Outpatient Medications (Endocrine & Metabolic):    glipiZIDE (GLUCOTROL) 10 MG tablet, TAKE 1 TABLET BY MOUTH 3  TIMES DAILY WITH MEALS   JARDIANCE 25 MG TABS tablet, START 1/2 TAB DAILY FOR 1 MONTH THEN INCREASE TO 1 TAB DAILY.   levothyroxine (SYNTHROID) 50 MCG tablet, TAKE 1 TABLET DAILY ON AN EMPTY STOMACH WITH ONLY WATER FOR 30 MINUTES   metFORMIN (GLUCOPHAGE-XR) 500 MG 24 hr tablet, Take 2 tablets 2 x /day with Meals for Diabetes  Current Outpatient Medications (Cardiovascular):    amLODipine (NORVASC) 5 MG tablet, TAKE 1 TABLET EVERY DAY FOR BLOOD PRESSURE   atorvastatin (LIPITOR) 40 MG tablet, Take 1 tablet  Daily for Cholesterol   lisinopril-hydrochlorothiazide (ZESTORETIC) 10-12.5 MG tablet, Take 1 tablet by mouth daily.  Current Outpatient Medications (Respiratory):    promethazine-dextromethorphan (PROMETHAZINE-DM) 6.25-15 MG/5ML syrup, Take 5 mLs by mouth 4 (four) times daily as needed for cough.   albuterol (PROVENTIL HFA;VENTOLIN HFA) 108 (90 Base) MCG/ACT inhaler, Inhale 2 puffs into the lungs every 6 (six) hours as needed for wheezing or shortness of breath (cough).  Current Outpatient Medications (Analgesics):    traMADol (ULTRAM) 50 MG tablet, Take 1 tab as needed up to every 6 hours for severe pain.   Current Outpatient Medications (Other):    Biotin 10 MG TABS, Take by mouth daily.   Calcium Carbonate-Vitamin D (CALCIUM-VITAMIN D) 500-200 MG-UNIT per tablet, Take 1 tablet by mouth 2  (two) times daily with a meal.   CINNAMON PO, Take 1,000 Units by mouth daily. 2 capsules daily   Magnesium 250 MG TABS, Take 250 mg by mouth daily.   traZODone (DESYREL) 150 MG tablet, Take 1 tablet (150 mg total) by mouth at bedtime. Take 1/2 to 1 tablet at bedtime as needed for sleep   Vibegron (GEMTESA) 75 MG TABS, Take 75 mg by mouth daily.  Current Problems (verified) Patient Active Problem List   Diagnosis Date Noted   Osteopenia 11/04/2020   B12 deficiency 09/16/2020   Arthritis of left hand 05/16/2020   Arthritis of right hand 05/14/2020   RBBB 02/08/2018   Insomnia 10/27/2017   CKD stage 3 due to type 2 diabetes mellitus ([place]) 06/27/2017   BMI 22.0-22.9, adult 06/27/2017   Medication management 03/08/2014   Hyperlipidemia associated with type 2 diabetes mellitus ([place]) 03/21/2013   Vitamin D deficiency 03/21/2013   Type 2 diabetes mellitus ([place]) 03/17/2013   Hypothyroidism    Cervical radiculopathy at C6 10/22/2011   Essential hypertension 07/01/2009    Screening Tests Immunization History  Administered Date(s) Administered   Influenza Split 02/13/2013   Influenza, High Dose Seasonal PF 12/07/2014, 12/09/2015, 12/15/2016, 02/08/2018, 12/16/2018   Influenza-Unspecified 03/26/2014   PFIZER(Purple Top)SARS-COV-2 Vaccination 04/20/2019, 05/18/2019   Pneumococcal Conjugate-13 12/09/2015   Pneumococcal-Unspecified 03/23/2008   Tdap 07/17/2010   Zoster Recombinat (Shingrix) 12/16/2018, 04/06/2019   Zoster, Live 01/14/2012   Health Maintenance  Topic Date Due   Pneumonia Vaccine 64+ Years old (2 - PPSV23 if available, else PCV20) 12/08/2016   OPHTHALMOLOGY EXAM  12/21/2017   COVID-19 Vaccine (3 - Pfizer risk series) 06/15/2019   TETANUS/TDAP  07/16/2020   INFLUENZA VACCINE  10/21/2020   HEMOGLOBIN A1C  03/18/2021   FOOT EXAM  05/14/2022   Fecal DNA (Cologuard)  09/19/2022   MAMMOGRAM  11/01/2022   DEXA SCAN  11/01/2022   Hepatitis C Screening  Completed   Zoster  Vaccines- Shingrix  Completed   HPV VACCINES  Aged Out   Shingrix: 2/2 2021 Covid 19: requested booster date  Last colonoscopy: 2006 cologuard 08/2019  Last mammogram: 11/2020 at [facility] center mobile unit DEXA 11/02/2020 Spine T -1.9 - breast center mobile unit  Names of Other Physician/Practitioners you currently use: 1. [facility] here for primary care 2. [facility] Hassell [facility], eye doctor, last visit 12/21/2017- GOING TO MAKE OV  3. [facility], dentist, last visit 2022, goes q66m 4. Dr. Marland Kitchen Dermatologist, last visit 2022  Patient Care Team: Unk Pinto, MD as PCP - General (Internal Medicine) Burnell Blanks, MD as Consulting Physician (Cardiology) Juanita Craver, MD as Consulting Physician (Gastroenterology) Phylliss Bob, MD as Consulting Physician (  Orthopedic Surgery) Maisie Fus, MD as Consulting Physician (Obstetrics and Gynecology)  Allergies No Known Allergies  SURGICAL HISTORY She  has a past surgical history that includes Breast surgery (80's); Anterior cervical decomp/discectomy fusion (10/22/2011); and Breast excisional biopsy (Left). FAMILY HISTORY Her family history includes Alzheimer's disease in her father, paternal aunt, and paternal uncle; Diabetes in her maternal aunt, maternal grandmother, and maternal uncle; Drug abuse in her maternal grandmother; Heart disease in her mother; Hyperlipidemia in her mother; Hypertension in her mother. SOCIAL HISTORY She  reports that she has never smoked. She has never used smokeless tobacco. She reports current alcohol use of about 7.0 standard drinks per week. She reports that she does not use drugs.   Review of Systems  Constitutional:  Negative for chills, fever, malaise/fatigue and weight loss.  HENT:  Negative for congestion, ear pain, hearing loss, nosebleeds, sore throat and tinnitus.   Eyes:  Negative for blurred vision and double vision.  Respiratory:  Negative  for cough, shortness of breath and wheezing.   Cardiovascular:  Negative for chest pain, palpitations, orthopnea, claudication and leg swelling.  Gastrointestinal:  Negative for abdominal pain, blood in stool, constipation, diarrhea, heartburn, melena, nausea and vomiting.  Genitourinary: Negative.        Mild stress incontinence  Musculoskeletal:  Positive for joint pain (R hand left finger). Negative for myalgias.  Skin:  Negative for rash.  Neurological:  Negative for dizziness, tingling, sensory change, loss of consciousness, weakness and headaches.  Endo/Heme/Allergies:  Negative for polydipsia.  Psychiatric/Behavioral:  Negative for depression, memory loss and substance abuse. The patient is not nervous/anxious and does not have insomnia.   All other systems reviewed and are negative.   Objective:     Today's Vitals   05/14/21 1409  BP: 110/62  Pulse: 69  Temp: 97.7 F (36.5 C)  SpO2: 97%  Weight: 117 lb 12.8 oz (53.4 kg)  Height: 5' 3.5" (1.613 m)   Body mass index is 20.54 kg/m.  General appearance: alert, no distress, WD/WN, female HEENT: normocephalic, sclerae anicteric, TMs pearly, nares patent, no discharge or erythema, pharynx normal Oral cavity: Poor dentition; left lower jaw with teeth absent, erythematous gingiva with ? Scant purulent discharge; front teeth cracked/chipped; otherwise MM intact, uvula midline, sublingual glands non-inflamed Neck: supple, no lymphadenopathy, no thyromegaly, no masses Heart: RRR, normal S1, subtle spit S2, no murmurs Lungs: CTA bilaterally, no wheezes, rhonchi, or rales Abdomen: +bs, soft, non tender, non distended, no masses, no hepatomegaly, no splenomegaly Musculoskeletal: nontender, no swelling, no obvious deformity, symmetrical strength excepting R 4th digit with limited flexion, pain with extension, mild crepitus PIP joint without erythema/swelling. Distal neurovascular intact. No palpable palmar growth.  Extremities: no edema,  no cyanosis, no clubbing Pulses: 2+ symmetric, upper and lower extremities, normal cap refill Neurological: alert, oriented x 3, CN2-12 intact, strength normal upper extremities and lower extremities, sensation normal throughout, DTRs 2+ throughout, no cerebellar signs, gait normal. Sensation intact to monofilament bil feet.  Psychiatric: normal affect, behavior normal, pleasant  Skin: Warm/dry, intact, without notable lesions, ecchymosis, rash   EKG: NSR, RBBB, prolonged QT - Parks, NP   05/14/2021

## 2021-05-14 NOTE — Patient Instructions (Addendum)
°  [name] , Thank you for taking time to come for your Annual Wellness Visit. I appreciate your ongoing commitment to your health goals. Please review the following plan we discussed and let me know if I can assist you in the future.   These are the goals we discussed:  Goals      HEMOGLOBIN A1C < 7     LDL CALC < 70        This is a list of the screening recommended for you and due dates:  Health Maintenance  Topic Date Due   DEXA scan (bone density measurement)  01/12/2014   Pneumonia Vaccine (2 - PPSV23 if available, else PCV20) 12/08/2016   Eye exam for diabetics  12/21/2017   COVID-19 Vaccine (3 - Pfizer risk series) 06/15/2019   Tetanus Vaccine  07/16/2020   Flu Shot  10/21/2020   Hemoglobin A1C  03/18/2021   Complete foot exam   05/14/2021   Cologuard (Stool DNA test)  09/19/2022   Mammogram  11/01/2022   Hepatitis C Screening: USPSTF Recommendation to screen - Ages 18-79 yo.  Completed   Zoster (Shingles) Vaccine  Completed   HPV Vaccine  Aged Out     Know what a healthy weight is for you (roughly BMI <25) and aim to maintain this  Aim for 7+ servings of fruits and vegetables daily  65-80+ fluid ounces of water or unsweet tea for healthy kidneys  Limit to max 1 drink of alcohol per day; avoid smoking/tobacco  Limit animal fats in diet for cholesterol and heart health - choose grass fed whenever available  Avoid highly processed foods, and foods high in saturated/trans fats  Aim for low stress - take time to unwind and care for your mental health  Aim for 150 min of moderate intensity exercise weekly for heart health, and weights twice weekly for bone health  Aim for 7-9 hours of sleep daily   A great goal to work towards is aiming to get in a serving daily of some of the most nutritionally dense foods - G- BOMBS daily

## 2021-05-15 ENCOUNTER — Encounter: Payer: Self-pay | Admitting: Adult Health

## 2021-05-15 LAB — URINALYSIS, ROUTINE W REFLEX MICROSCOPIC
Bilirubin Urine: NEGATIVE
Hgb urine dipstick: NEGATIVE
Ketones, ur: NEGATIVE
Leukocytes,Ua: NEGATIVE
Nitrite: NEGATIVE
Protein, ur: NEGATIVE
Specific Gravity, Urine: 1.033 (ref 1.001–1.035)
pH: <=5 (ref 5.0–8.0)

## 2021-05-15 LAB — CBC WITH DIFFERENTIAL/PLATELET
Absolute Monocytes: 508 cells/uL (ref 200–950)
Basophils Absolute: 85 cells/uL (ref 0–200)
Basophils Relative: 0.7 %
Eosinophils Absolute: 169 cells/uL (ref 15–500)
Eosinophils Relative: 1.4 %
HCT: 47.2 % — ABNORMAL HIGH (ref 35.0–45.0)
Hemoglobin: 15.4 g/dL (ref 11.7–15.5)
Lymphs Abs: 2190 cells/uL (ref 850–3900)
MCH: 28.6 pg (ref 27.0–33.0)
MCHC: 32.6 g/dL (ref 32.0–36.0)
MCV: 87.7 fL (ref 80.0–100.0)
MPV: 10.7 fL (ref 7.5–12.5)
Monocytes Relative: 4.2 %
Neutro Abs: 9148 cells/uL — ABNORMAL HIGH (ref 1500–7800)
Neutrophils Relative %: 75.6 %
Platelets: 472 10*3/uL — ABNORMAL HIGH (ref 140–400)
RBC: 5.38 10*6/uL — ABNORMAL HIGH (ref 3.80–5.10)
RDW: 12.1 % (ref 11.0–15.0)
Total Lymphocyte: 18.1 %
WBC: 12.1 10*3/uL — ABNORMAL HIGH (ref 3.8–10.8)

## 2021-05-15 LAB — COMPLETE METABOLIC PANEL WITH GFR
AG Ratio: 1.7 (calc) (ref 1.0–2.5)
ALT: 20 U/L (ref 6–29)
AST: 19 U/L (ref 10–35)
Albumin: 4.3 g/dL (ref 3.6–5.1)
Alkaline phosphatase (APISO): 72 U/L (ref 37–153)
BUN/Creatinine Ratio: 25 (calc) — ABNORMAL HIGH (ref 6–22)
BUN: 27 mg/dL — ABNORMAL HIGH (ref 7–25)
CO2: 30 mmol/L (ref 20–32)
Calcium: 10.3 mg/dL (ref 8.6–10.4)
Chloride: 100 mmol/L (ref 98–110)
Creat: 1.06 mg/dL — ABNORMAL HIGH (ref 0.60–1.00)
Globulin: 2.6 g/dL (calc) (ref 1.9–3.7)
Glucose, Bld: 207 mg/dL — ABNORMAL HIGH (ref 65–99)
Potassium: 4.6 mmol/L (ref 3.5–5.3)
Sodium: 140 mmol/L (ref 135–146)
Total Bilirubin: 0.4 mg/dL (ref 0.2–1.2)
Total Protein: 6.9 g/dL (ref 6.1–8.1)
eGFR: 55 mL/min/{1.73_m2} — ABNORMAL LOW (ref >=60)

## 2021-05-15 LAB — LIPID PANEL
Cholesterol: 138 mg/dL (ref <200)
HDL: 57 mg/dL (ref >=50)
LDL Cholesterol (Calc): 60 mg/dL (calc)
Non-HDL Cholesterol (Calc): 81 mg/dL (calc) (ref <130)
Total CHOL/HDL Ratio: 2.4 (calc) (ref <5.0)
Triglycerides: 126 mg/dL (ref <150)

## 2021-05-15 LAB — HEMOGLOBIN A1C
Hgb A1c MFr Bld: 7.9 % of total Hgb — ABNORMAL HIGH (ref <5.7)
Mean Plasma Glucose: 180 mg/dL
eAG (mmol/L): 10 mmol/L

## 2021-05-15 LAB — MICROALBUMIN / CREATININE URINE RATIO
Creatinine, Urine: 83 mg/dL (ref 20–275)
Microalb Creat Ratio: 11 mcg/mg creat (ref <30)
Microalb, Ur: 0.9 mg/dL

## 2021-05-15 LAB — VITAMIN D 25 HYDROXY (VIT D DEFICIENCY, FRACTURES): Vit D, 25-Hydroxy: 47 ng/mL (ref 30–100)

## 2021-05-15 LAB — MAGNESIUM: Magnesium: 1.9 mg/dL (ref 1.5–2.5)

## 2021-05-15 LAB — TSH: TSH: 1.96 mIU/L (ref 0.40–4.50)

## 2021-05-17 ENCOUNTER — Other Ambulatory Visit: Payer: Self-pay | Admitting: Urology

## 2021-05-29 DIAGNOSIS — E119 Type 2 diabetes mellitus without complications: Secondary | ICD-10-CM | POA: Diagnosis not present

## 2021-05-29 LAB — HM DIABETES EYE EXAM

## 2021-06-14 ENCOUNTER — Other Ambulatory Visit: Payer: Self-pay | Admitting: Nurse Practitioner

## 2021-06-24 ENCOUNTER — Encounter: Payer: Self-pay | Admitting: Internal Medicine

## 2021-08-21 ENCOUNTER — Other Ambulatory Visit: Payer: Self-pay | Admitting: Internal Medicine

## 2021-08-21 DIAGNOSIS — E039 Hypothyroidism, unspecified: Secondary | ICD-10-CM

## 2021-09-16 ENCOUNTER — Encounter: Payer: Self-pay | Admitting: Adult Health

## 2021-09-16 ENCOUNTER — Ambulatory Visit: Payer: Medicare PPO | Admitting: Adult Health

## 2021-09-16 VITALS — BP 120/72 | HR 56 | Temp 97.1°F | Wt 115.0 lb

## 2021-09-16 DIAGNOSIS — E039 Hypothyroidism, unspecified: Secondary | ICD-10-CM | POA: Diagnosis not present

## 2021-09-16 DIAGNOSIS — E538 Deficiency of other specified B group vitamins: Secondary | ICD-10-CM

## 2021-09-16 DIAGNOSIS — I10 Essential (primary) hypertension: Secondary | ICD-10-CM | POA: Diagnosis not present

## 2021-09-16 DIAGNOSIS — R9431 Abnormal electrocardiogram [ECG] [EKG]: Secondary | ICD-10-CM | POA: Diagnosis not present

## 2021-09-16 DIAGNOSIS — M8580 Other specified disorders of bone density and structure, unspecified site: Secondary | ICD-10-CM | POA: Diagnosis not present

## 2021-09-16 DIAGNOSIS — N183 Chronic kidney disease, stage 3 unspecified: Secondary | ICD-10-CM | POA: Diagnosis not present

## 2021-09-16 DIAGNOSIS — N1831 Chronic kidney disease, stage 3a: Secondary | ICD-10-CM | POA: Diagnosis not present

## 2021-09-16 DIAGNOSIS — I4510 Unspecified right bundle-branch block: Secondary | ICD-10-CM | POA: Diagnosis not present

## 2021-09-16 DIAGNOSIS — Z79899 Other long term (current) drug therapy: Secondary | ICD-10-CM

## 2021-09-16 DIAGNOSIS — E1169 Type 2 diabetes mellitus with other specified complication: Secondary | ICD-10-CM

## 2021-09-16 DIAGNOSIS — Z6820 Body mass index (BMI) 20.0-20.9, adult: Secondary | ICD-10-CM

## 2021-09-16 DIAGNOSIS — E1122 Type 2 diabetes mellitus with diabetic chronic kidney disease: Secondary | ICD-10-CM | POA: Diagnosis not present

## 2021-09-16 DIAGNOSIS — R6889 Other general symptoms and signs: Secondary | ICD-10-CM | POA: Diagnosis not present

## 2021-09-16 DIAGNOSIS — E785 Hyperlipidemia, unspecified: Secondary | ICD-10-CM | POA: Diagnosis not present

## 2021-09-16 DIAGNOSIS — M546 Pain in thoracic spine: Secondary | ICD-10-CM

## 2021-09-16 DIAGNOSIS — Z6822 Body mass index (BMI) 22.0-22.9, adult: Secondary | ICD-10-CM

## 2021-09-16 DIAGNOSIS — Z0000 Encounter for general adult medical examination without abnormal findings: Secondary | ICD-10-CM

## 2021-09-16 DIAGNOSIS — G4700 Insomnia, unspecified: Secondary | ICD-10-CM

## 2021-09-16 DIAGNOSIS — Z0001 Encounter for general adult medical examination with abnormal findings: Secondary | ICD-10-CM | POA: Diagnosis not present

## 2021-09-16 DIAGNOSIS — E559 Vitamin D deficiency, unspecified: Secondary | ICD-10-CM

## 2021-09-16 MED ORDER — TRIAMCINOLONE ACETONIDE 0.5 % EX CREA: 1.0000 | TOPICAL_CREAM | Freq: Two times a day (BID) | CUTANEOUS | 2 refills | Status: DC

## 2021-09-16 MED ORDER — TRAMADOL HCL 50 MG PO TABS: ORAL_TABLET | ORAL | 0 refills | Status: DC

## 2021-09-17 LAB — CBC WITH DIFFERENTIAL/PLATELET
Absolute Monocytes: 403 cells/uL (ref 200–950)
Basophils Absolute: 84 cells/uL (ref 0–200)
Basophils Relative: 1 %
Eosinophils Absolute: 311 cells/uL (ref 15–500)
Eosinophils Relative: 3.7 %
HCT: 45 % (ref 35.0–45.0)
Hemoglobin: 14.9 g/dL (ref 11.7–15.5)
Lymphs Abs: 1747 cells/uL (ref 850–3900)
MCH: 29.2 pg (ref 27.0–33.0)
MCHC: 33.1 g/dL (ref 32.0–36.0)
MCV: 88.2 fL (ref 80.0–100.0)
MPV: 10.5 fL (ref 7.5–12.5)
Monocytes Relative: 4.8 %
Neutro Abs: 5855 cells/uL (ref 1500–7800)
Neutrophils Relative %: 69.7 %
Platelets: 344 10*3/uL (ref 140–400)
RBC: 5.1 10*6/uL (ref 3.80–5.10)
RDW: 12.6 % (ref 11.0–15.0)
Total Lymphocyte: 20.8 %
WBC: 8.4 10*3/uL (ref 3.8–10.8)

## 2021-09-17 LAB — COMPLETE METABOLIC PANEL WITH GFR
AG Ratio: 1.8 (calc) (ref 1.0–2.5)
ALT: 18 U/L (ref 6–29)
AST: 17 U/L (ref 10–35)
Albumin: 4.4 g/dL (ref 3.6–5.1)
Alkaline phosphatase (APISO): 75 U/L (ref 37–153)
BUN/Creatinine Ratio: 21 (calc) (ref 6–22)
BUN: 22 mg/dL (ref 7–25)
CO2: 26 mmol/L (ref 20–32)
Calcium: 9.8 mg/dL (ref 8.6–10.4)
Chloride: 102 mmol/L (ref 98–110)
Creat: 1.07 mg/dL — ABNORMAL HIGH (ref 0.60–1.00)
Globulin: 2.5 g/dL (calc) (ref 1.9–3.7)
Glucose, Bld: 128 mg/dL — ABNORMAL HIGH (ref 65–99)
Potassium: 4.2 mmol/L (ref 3.5–5.3)
Sodium: 140 mmol/L (ref 135–146)
Total Bilirubin: 0.5 mg/dL (ref 0.2–1.2)
Total Protein: 6.9 g/dL (ref 6.1–8.1)
eGFR: 55 mL/min/{1.73_m2} — ABNORMAL LOW (ref >=60)

## 2021-09-17 LAB — LIPID PANEL
Cholesterol: 143 mg/dL (ref <200)
HDL: 64 mg/dL (ref >=50)
LDL Cholesterol (Calc): 61 mg/dL (calc)
Non-HDL Cholesterol (Calc): 79 mg/dL (calc) (ref <130)
Total CHOL/HDL Ratio: 2.2 (calc) (ref <5.0)
Triglycerides: 92 mg/dL (ref <150)

## 2021-09-17 LAB — HEMOGLOBIN A1C
Hgb A1c MFr Bld: 8.3 % of total Hgb — ABNORMAL HIGH (ref <5.7)
Mean Plasma Glucose: 192 mg/dL
eAG (mmol/L): 10.6 mmol/L

## 2021-09-17 LAB — TSH: TSH: 2.82 mIU/L (ref 0.40–4.50)

## 2021-09-17 LAB — MAGNESIUM: Magnesium: 2.1 mg/dL (ref 1.5–2.5)

## 2021-09-30 ENCOUNTER — Other Ambulatory Visit: Payer: Self-pay | Admitting: Adult Health

## 2021-09-30 ENCOUNTER — Telehealth: Payer: Self-pay | Admitting: Adult Health

## 2021-09-30 MED ORDER — GLIPIZIDE 10 MG PO TABS: ORAL_TABLET | ORAL | 3 refills | Status: DC

## 2021-09-30 NOTE — Telephone Encounter (Signed)
Pt is requesting refill on Glipizide to go to [facility] order pharmacy. Pharmacy said prescription expired

## 2021-10-17 DIAGNOSIS — H2512 Age-related nuclear cataract, left eye: Secondary | ICD-10-CM | POA: Diagnosis not present

## 2021-10-17 DIAGNOSIS — H2513 Age-related nuclear cataract, bilateral: Secondary | ICD-10-CM | POA: Diagnosis not present

## 2021-10-17 DIAGNOSIS — H40013 Open angle with borderline findings, low risk, bilateral: Secondary | ICD-10-CM | POA: Diagnosis not present

## 2021-10-28 DIAGNOSIS — R238 Other skin changes: Secondary | ICD-10-CM | POA: Diagnosis not present

## 2021-10-28 DIAGNOSIS — L281 Prurigo nodularis: Secondary | ICD-10-CM | POA: Diagnosis not present

## 2021-12-11 DIAGNOSIS — H2513 Age-related nuclear cataract, bilateral: Secondary | ICD-10-CM | POA: Diagnosis not present

## 2021-12-11 DIAGNOSIS — H2512 Age-related nuclear cataract, left eye: Secondary | ICD-10-CM | POA: Diagnosis not present

## 2021-12-12 DIAGNOSIS — H2511 Age-related nuclear cataract, right eye: Secondary | ICD-10-CM | POA: Diagnosis not present

## 2021-12-17 ENCOUNTER — Ambulatory Visit: Payer: Medicare PPO | Admitting: Internal Medicine

## 2021-12-23 NOTE — Patient Instructions (Signed)

## 2021-12-23 NOTE — Progress Notes (Unsigned)
Future Appointments  Date Time Provider Department  12/24/2021  4:00 PM Unk Pinto, MD [facility]  04/23/2022 11:00 AM Debroah Loop, PA-C [facility]  05/14/2022                   cpe  2:00 PM Darrol Jump, NP [facility]  08/12/2022                   wellness 10:00 AM Alycia Rossetti, NP [facility]     History of Present Illness:       This very nice 76 y.o.  MWF  presents for 3 month follow up with HTN, HLD, T2_NIDDM/CKD3, Hypothyroidism and Vitamin D Deficiency.         Patient is treated for HTN (2007) & BP has been controlled at home. Today's BP is at goal - 110/60 . Patient has had no complaints of any cardiac type chest pain, palpitations, dyspnea Vertell Limber /PND, dizziness, claudication or dependent edema.       Hyperlipidemia is controlled with diet & meds. Patient denies myalgias or other med SE's. Last Lipids were mat goal:  Lab Results  Component Value Date   CHOL 146 09/16/2020   HDL 60 09/16/2020   LDLCALC 66 09/16/2020   TRIG 123 09/16/2020   CHOLHDL 2.4 09/16/2020     Also, the patient has history of T2_NIDDM (2006) w/CKD3 a and has had no symptoms of reactive hypoglycemia, diabetic polys, paresthesias or visual blurring.  Last A1c was not at goal:  Lab Results  Component Value Date   HGBA1C 7.7 (H) 09/16/2020   Wt Readings from Last 3 Encounters:  12/24/21 115 lb 3.2 oz (52.3 kg)  09/16/21 115 lb (52.2 kg)  05/14/21 117 lb 12.8 oz (53.4 kg)             Patient was dx'd Hypothyroid circa 2008 & has been on replacement since.                                                       Further, the patient also has history of Vitamin D Deficiency and supplements vitamin D without any suspected side-effects. Last vitamin D was still low:  Lab Results  Component Value Date   VD25OH 42 05/14/2020     Current Outpatient Medications on File Prior to Visit  Medication Sig   albuterol HFA inhaler Inhale 2 puffs  every 6  hours as  needed   amLODipine  5 MG tablet Take 1 tablet Daily for BP   atorvastatin 40 MG tablet Take 1 tablet Daily for Cholesterol   azelastine (ASTELIN) 0.1 % nasal spray Place 2 sprays into nostrils 2 times daily.    Biotin 10 MG TABS Take daily.   CALCIUM-VIT D  500-200 MG-UNIT    Take 1 tablet  2  times daily with a meal.   CINNAMON  Take 1,000 Units  daily. 2 capsules daily   FLONASE nasal spray Place 2 sprays into both nostrils daily.   glipiZIDE 10 MG tablet TAKE 1 TABLET  3  TIMES DAILY   JARDIANCE 25 MG TABS tablet 1 TAB DAILY.   levothyroxine 50 MCG tablet TAKE 1 TABLET DAILY    lisinopril-hctz 20-25 MG tablet Take 1/2-1 tablet Daily for BP   Magnesium 250 MG  TABS Take  daily.   metFORMIN -XR 500 MG 24 hr tablet Take 2 tablets 2 x /day with Meals    promethazine-DM 6.25-15 MG/5ML syrup Take 5 mLs  4 \\times  daily as needed    traMADol (ULTRAM) 50 MG tablet Take 1 tab as needed every 6 hours    traZODone (DESYREL) 150 MG tablet Take 1/2 to 1 tablet at bedtime as neede    No Known Allergies  PMHx:   Past Medical History:  Diagnosis Date   Diabetes mellitus    Headache(784.0)    hx migraines   Hyperlipidemia    Hypertension    Hypothyroidism     Immunization History  Administered Date(s) Administered   Influenza Split 02/13/2013   Influenza, High Dose  12/07/2014, 12/09/2015, 12/15/2016, 02/08/2018, 12/16/2018   Influenza 03/26/2014   PFIZER SARS-COV-2 Vacc 04/20/2019, 05/18/2019   Pneumococcal -13 12/09/2015   Pneumococcal-23 03/23/2008   Tdap 07/17/2010   Zoster Recombinat (Shingrix) 12/16/2018, 04/06/2019   Zoster, Live 01/14/2012     Past Surgical History:  Procedure Laterality Date   ANTERIOR CERVICAL DECOMP/DISCECTOMY FUSION  10/22/2011   Procedure: ANTERIOR CERVICAL DECOMPRESSION/DISCECTOMY FUSION 1 LEVEL;  Surgeon: Sinclair Ship, MD;  Location: [facility];  Service: Orthopedics;  Laterality: Right;  Anterior cervical decompression fusion, cervical 5-6 with  instrumentation and allograft.   BREAST EXCISIONAL BIOPSY Left    30 years ago - benign   BREAST SURGERY  80's   lft cyst    FHx:    Reviewed / unchanged  SHx:    Reviewed / unchanged   Systems Review:  Constitutional: Denies fever, chills, wt changes, headaches, insomnia, fatigue, night sweats, change in appetite. Eyes: Denies redness, blurred vision, diplopia, discharge, itchy, watery eyes.  ENT: Denies discharge, congestion, post nasal drip, epistaxis, sore throat, earache, hearing loss, dental pain, tinnitus, vertigo, sinus pain, snoring.  CV: Denies chest pain, palpitations, irregular heartbeat, syncope, dyspnea, diaphoresis, orthopnea, PND, claudication or edema. Respiratory: denies cough, dyspnea, DOE, pleurisy, hoarseness, laryngitis, wheezing.  Gastrointestinal: Denies dysphagia, odynophagia, heartburn, reflux, water brash, abdominal pain or cramps, nausea, vomiting, bloating, diarrhea, constipation, hematemesis, melena, hematochezia  or hemorrhoids. Genitourinary: Denies dysuria, frequency, urgency, nocturia, hesitancy, discharge, hematuria or flank pain. Musculoskeletal: Denies arthralgias, myalgias, stiffness, jt. swelling, pain, limping or strain/sprain.  Skin: Denies pruritus, rash, hives, warts, acne, eczema or change in skin lesion(s). Neuro: No weakness, tremor, incoordination, spasms, paresthesia or pain. Psychiatric: Denies confusion, memory loss or sensory loss. Endo: Denies change in weight, skin or hair change.  Heme/Lymph: No excessive bleeding, bruising or enlarged lymph nodes.  Physical Exam  BP 110/60   Pulse 63   Temp 97.9 F (36.6 C)   Resp 16   Ht 5' 3.5" (1.613 m)   Wt 115 lb 3.2 oz (52.3 kg)   SpO2 98%   BMI 20.09 kg/m   Appears  well nourished, well groomed  and in no distress.  Eyes: PERRLA, EOMs, conjunctiva no swelling or erythema. Sinuses: No frontal/maxillary tenderness ENT/Mouth: EAC's clear, TM's nl w/o erythema, bulging. Nares clear  w/o erythema, swelling, exudates. Oropharynx clear without erythema or exudates. Oral hygiene is good. Tongue normal, non obstructing. Hearing intact.  Neck: Supple. Thyroid not palpable. Car 2+/2+ without bruits, nodes or JVD. Chest: Respirations nl with BS clear & equal w/o rales, rhonchi, wheezing or stridor.  Cor: Heart sounds normal w/ regular rate and rhythm without sig. murmurs, gallops, clicks or rubs. Peripheral pulses normal and equal  without edema.  Abdomen: Soft &  bowel sounds normal. Non-tender w/o guarding, rebound, hernias, masses or organomegaly.  Lymphatics: Unremarkable.  Musculoskeletal: Full ROM all peripheral extremities, joint stability, 5/5 strength and normal gait.  Skin: Warm, dry without exposed rashes, lesions or ecchymosis apparent.  Neuro: Cranial nerves intact, reflexes equal bilaterally. Sensory-motor testing grossly intact. Tendon reflexes grossly intact.  Pysch: Alert & oriented x 3.  Insight and judgement nl & appropriate. No ideations.  Assessment and Plan:  1. Essential hypertension  - CBC with Differential/Platelet - COMPLETE METABOLIC PANEL WITH GFR - Magnesium - TSH  2. Hyperlipidemia associated with type 2 diabetes mellitus (HCC)  - Lipid panel - TSH  3. Type 2 diabetes mellitus with stage 3a chronic kidney  disease, without long-term current use of insulin (HCC)  - Hemoglobin A1c - Insulin, random  4. Vitamin D deficiency  - VITAMIN D 25 Hydroxy   5. Hypothyroidism, unspecified type  - TSH  6. Medication management  - CBC with Differential/Platelet - COMPLETE METABOLIC PANEL WITH GFR - Magnesium - Lipid panel - TSH - Hemoglobin A1c - Insulin, random - VITAMIN D 25 Hydroxy         Discussed  regular exercise, BP monitoring, weight control to achieve/maintain BMI less than 25 and discussed med and SE's. Recommended labs to assess and monitor clinical status with further disposition pending results of labs.  I discussed the  assessment and treatment plan with the patient. The patient was provided an opportunity to ask questions and all were answered. The patient agreed with the plan and demonstrated an understanding of the instructions.  I provided over 30 minutes of exam, counseling, chart review and  complex critical decision making.        The patient was advised to call back or seek an in-person evaluation if the symptoms worsen or if the condition fails to improve as anticipated.   Kirtland Bouchard, MD

## 2021-12-24 ENCOUNTER — Encounter: Payer: Self-pay | Admitting: Internal Medicine

## 2021-12-24 ENCOUNTER — Ambulatory Visit: Payer: Medicare PPO | Admitting: Internal Medicine

## 2021-12-24 VITALS — BP 110/60 | HR 63 | Temp 97.9°F | Resp 16 | Ht 63.5 in | Wt 115.2 lb

## 2021-12-24 DIAGNOSIS — E039 Hypothyroidism, unspecified: Secondary | ICD-10-CM

## 2021-12-24 DIAGNOSIS — E785 Hyperlipidemia, unspecified: Secondary | ICD-10-CM | POA: Diagnosis not present

## 2021-12-24 DIAGNOSIS — I10 Essential (primary) hypertension: Secondary | ICD-10-CM | POA: Diagnosis not present

## 2021-12-24 DIAGNOSIS — Z79899 Other long term (current) drug therapy: Secondary | ICD-10-CM | POA: Diagnosis not present

## 2021-12-24 DIAGNOSIS — N1831 Chronic kidney disease, stage 3a: Secondary | ICD-10-CM

## 2021-12-24 DIAGNOSIS — E559 Vitamin D deficiency, unspecified: Secondary | ICD-10-CM | POA: Diagnosis not present

## 2021-12-24 DIAGNOSIS — E1122 Type 2 diabetes mellitus with diabetic chronic kidney disease: Secondary | ICD-10-CM | POA: Diagnosis not present

## 2021-12-24 DIAGNOSIS — E1169 Type 2 diabetes mellitus with other specified complication: Secondary | ICD-10-CM

## 2021-12-25 LAB — COMPLETE METABOLIC PANEL WITH GFR
AG Ratio: 2 (calc) (ref 1.0–2.5)
ALT: 23 U/L (ref 6–29)
AST: 17 U/L (ref 10–35)
Albumin: 4.3 g/dL (ref 3.6–5.1)
Alkaline phosphatase (APISO): 58 U/L (ref 37–153)
BUN/Creatinine Ratio: 30 (calc) — ABNORMAL HIGH (ref 6–22)
BUN: 32 mg/dL — ABNORMAL HIGH (ref 7–25)
CO2: 24 mmol/L (ref 20–32)
Calcium: 9.3 mg/dL (ref 8.6–10.4)
Chloride: 100 mmol/L (ref 98–110)
Creat: 1.05 mg/dL — ABNORMAL HIGH (ref 0.60–1.00)
Globulin: 2.1 g/dL (calc) (ref 1.9–3.7)
Glucose, Bld: 275 mg/dL — ABNORMAL HIGH (ref 65–99)
Potassium: 4.6 mmol/L (ref 3.5–5.3)
Sodium: 137 mmol/L (ref 135–146)
Total Bilirubin: 0.5 mg/dL (ref 0.2–1.2)
Total Protein: 6.4 g/dL (ref 6.1–8.1)
eGFR: 56 mL/min/{1.73_m2} — ABNORMAL LOW (ref >=60)

## 2021-12-25 LAB — LIPID PANEL
Cholesterol: 132 mg/dL (ref <200)
HDL: 57 mg/dL (ref >=50)
LDL Cholesterol (Calc): 56 mg/dL (calc)
Non-HDL Cholesterol (Calc): 75 mg/dL (calc) (ref <130)
Total CHOL/HDL Ratio: 2.3 (calc) (ref <5.0)
Triglycerides: 105 mg/dL (ref <150)

## 2021-12-25 LAB — CBC WITH DIFFERENTIAL/PLATELET
Absolute Monocytes: 402 cells/uL (ref 200–950)
Basophils Absolute: 49 cells/uL (ref 0–200)
Basophils Relative: 0.6 %
Eosinophils Absolute: 139 cells/uL (ref 15–500)
Eosinophils Relative: 1.7 %
HCT: 43.6 % (ref 35.0–45.0)
Hemoglobin: 14.6 g/dL (ref 11.7–15.5)
Lymphs Abs: 1566 cells/uL (ref 850–3900)
MCH: 29.4 pg (ref 27.0–33.0)
MCHC: 33.5 g/dL (ref 32.0–36.0)
MCV: 87.7 fL (ref 80.0–100.0)
MPV: 10.8 fL (ref 7.5–12.5)
Monocytes Relative: 4.9 %
Neutro Abs: 6043 cells/uL (ref 1500–7800)
Neutrophils Relative %: 73.7 %
Platelets: 321 10*3/uL (ref 140–400)
RBC: 4.97 10*6/uL (ref 3.80–5.10)
RDW: 12.5 % (ref 11.0–15.0)
Total Lymphocyte: 19.1 %
WBC: 8.2 10*3/uL (ref 3.8–10.8)

## 2021-12-25 LAB — HEMOGLOBIN A1C
Hgb A1c MFr Bld: 8.3 % of total Hgb — ABNORMAL HIGH (ref <5.7)
Mean Plasma Glucose: 192 mg/dL
eAG (mmol/L): 10.6 mmol/L

## 2021-12-25 LAB — VITAMIN D 25 HYDROXY (VIT D DEFICIENCY, FRACTURES): Vit D, 25-Hydroxy: 48 ng/mL (ref 30–100)

## 2021-12-25 LAB — MAGNESIUM: Magnesium: 1.9 mg/dL (ref 1.5–2.5)

## 2021-12-25 LAB — INSULIN, RANDOM: Insulin: 14.7 u[IU]/mL

## 2021-12-25 LAB — TSH: TSH: 3.65 mIU/L (ref 0.40–4.50)

## 2021-12-25 NOTE — Progress Notes (Signed)
<><><><><><><><><><><><><><><><><><><><><><><><><><><><><><><><><> <><><><><><><><><><><><><><><><><><><><><><><><><><><><><><><><><> - Test results slightly outside the reference range are not unusual. If there is anything important, I will review this with you,  otherwise it is considered normal test values.  If you have further questions,  please do not hesitate to contact me at the office or via My Chart.  <><><><><><><><><><><><><><><><><><><><><><><><><><><><><><><><><> <><><><><><><><><><><><><><><><><><><><><><><><><><><><><><><><><>  -  Glucose = 275 mg % - way too high  ( Normal is less than 120 mg%  &  - A1c = 8.3% - Also Dangerously too high  ( Normal or gaol is less than 5.7%  )    Being diabetic has a  300% increased risk for heart attack,                                             stroke, cancer  and alzheimer- type vascular dementia.   It is very important that you work harder with diet by                             avoiding all foods that are white except chicken, fish & calliflower.  - Avoid white rice  (brown & wild rice is OK),   - Avoid white potatoes  (sweet potatoes in moderation is OK),   White bread or wheat bread or anything made out of   white flour like bagels, donuts, rolls, buns, biscuits, cakes,  - pastries, cookies, pizza crust, and pasta (made from  white flour & egg whites)   - vegetarian pasta or spinach or wheat pasta is OK.  - Multigrain breads like Arnold's, Pepperidge Farm or   multigrain sandwich thins or high fiber breads like   Eureka bread or "Dave's Killer" breads that are  4 to 5 grams fiber per slice !  are best.    Diet, exercise and weight loss can reverse and cure                                                                             diabetes in the early stages.    - Diet, exercise and weight loss is very important in the   control and prevention of complications of diabetes which                                                                affects every system in your body, ie.   -Brain - dementia/stroke,  - eyes - glaucoma/blindness,  - heart - heart attack/heart failure,  - kidneys - dialysis,  - stomach - gastric paralysis,  - intestines - malabsorption,  - nerves - severe painful neuritis,  - circulation - gangrene & loss of a leg(s)  - and finally  . . . . . . . . . . . . . . . . . .    - cancer and  Alzheimers. <><><><><><><><><><><><><><><><><><><><><><><><><><><><><><><><><> <><><><><><><><><><><><><><><><><><><><><><><><><><><><><><><><><>  -  Total Chol = 132 -    Wonderful  !  - Very low risk for Heart Attack  / Stroke <><><><><><><><><><><><><><><><><><><><><><><><><><><><><><><><><> <><><><><><><><><><><><><><><><><><><><><><><><><><><><><><><><><>  -  Vitamin D = 48 is Low   - Vitamin D goal is between 70-100.   - Please ADD another 5,000 units of Vit D to your current dose of  Vitamin D   - It is very important as a natural anti-inflammatory and helping the  immune system protect against viral infections, like the Covid-19    helping hair, skin, and nails, as well as reducing stroke and  heart attack risk.   - It helps your bones and helps with mood.  - It also decreases numerous cancer risks so please  take it as directed.   - Low Vit D is associated with a 200-300% higher risk for  CANCER   and 200-300% higher risk for HEART   ATTACK  &  STROKE.    - It is also associated with higher death rate at younger ages,   autoimmune diseases like Rheumatoid arthritis, Lupus,  Multiple Sclerosis.     - Also many other serious conditions, like depression, Alzheimer's  Dementia, infertility, muscle aches, fatigue, fibromyalgia   - just to name a few. <><><><><><><><><><><><><><><><><><><><><><><><><><><><><><><><><> <><><><><><><><><><><><><><><><><><><><><><><><><><><><><><><><><>  -  All Else - CBC - Kidneys - Electrolytes - Liver - Magnesium & Thyroid    -  all  Normal / OK <><><><><><><><><><><><><><><><><><><><><><><><><><><><><><><><><> <><><><><><><><><><><><><><><><><><><><><><><><><><><><><><><><><>

## 2022-01-01 DIAGNOSIS — H2511 Age-related nuclear cataract, right eye: Secondary | ICD-10-CM | POA: Diagnosis not present

## 2022-01-01 DIAGNOSIS — H2512 Age-related nuclear cataract, left eye: Secondary | ICD-10-CM | POA: Diagnosis not present

## 2022-01-01 DIAGNOSIS — H2513 Age-related nuclear cataract, bilateral: Secondary | ICD-10-CM | POA: Diagnosis not present

## 2022-02-22 ENCOUNTER — Other Ambulatory Visit: Payer: Self-pay | Admitting: Nurse Practitioner

## 2022-02-22 DIAGNOSIS — E039 Hypothyroidism, unspecified: Secondary | ICD-10-CM

## 2022-04-23 ENCOUNTER — Ambulatory Visit: Payer: Medicare PPO | Admitting: Physician Assistant

## 2022-04-27 ENCOUNTER — Ambulatory Visit: Payer: Medicare PPO | Admitting: Physician Assistant

## 2022-05-14 ENCOUNTER — Encounter: Payer: Self-pay | Admitting: Nurse Practitioner

## 2022-05-14 ENCOUNTER — Ambulatory Visit (INDEPENDENT_AMBULATORY_CARE_PROVIDER_SITE_OTHER): Payer: Medicare PPO | Admitting: Nurse Practitioner

## 2022-05-14 VITALS — BP 128/72 | HR 61 | Temp 98.2°F | Ht 63.5 in | Wt 116.2 lb

## 2022-05-14 DIAGNOSIS — I10 Essential (primary) hypertension: Secondary | ICD-10-CM | POA: Diagnosis not present

## 2022-05-14 DIAGNOSIS — M19041 Primary osteoarthritis, right hand: Secondary | ICD-10-CM

## 2022-05-14 DIAGNOSIS — I4510 Unspecified right bundle-branch block: Secondary | ICD-10-CM

## 2022-05-14 DIAGNOSIS — G4700 Insomnia, unspecified: Secondary | ICD-10-CM

## 2022-05-14 DIAGNOSIS — Z79899 Other long term (current) drug therapy: Secondary | ICD-10-CM

## 2022-05-14 DIAGNOSIS — Z1211 Encounter for screening for malignant neoplasm of colon: Secondary | ICD-10-CM

## 2022-05-14 DIAGNOSIS — Z136 Encounter for screening for cardiovascular disorders: Secondary | ICD-10-CM

## 2022-05-14 DIAGNOSIS — E559 Vitamin D deficiency, unspecified: Secondary | ICD-10-CM | POA: Diagnosis not present

## 2022-05-14 DIAGNOSIS — E782 Mixed hyperlipidemia: Secondary | ICD-10-CM

## 2022-05-14 DIAGNOSIS — Z1231 Encounter for screening mammogram for malignant neoplasm of breast: Secondary | ICD-10-CM

## 2022-05-14 DIAGNOSIS — E039 Hypothyroidism, unspecified: Secondary | ICD-10-CM | POA: Diagnosis not present

## 2022-05-14 DIAGNOSIS — M5412 Radiculopathy, cervical region: Secondary | ICD-10-CM

## 2022-05-14 DIAGNOSIS — M8580 Other specified disorders of bone density and structure, unspecified site: Secondary | ICD-10-CM

## 2022-05-14 DIAGNOSIS — E1122 Type 2 diabetes mellitus with diabetic chronic kidney disease: Secondary | ICD-10-CM

## 2022-05-14 DIAGNOSIS — Z0001 Encounter for general adult medical examination with abnormal findings: Secondary | ICD-10-CM | POA: Diagnosis not present

## 2022-05-14 DIAGNOSIS — Z0000 Encounter for general adult medical examination without abnormal findings: Secondary | ICD-10-CM

## 2022-05-14 DIAGNOSIS — N183 Chronic kidney disease, stage 3 unspecified: Secondary | ICD-10-CM | POA: Diagnosis not present

## 2022-05-14 DIAGNOSIS — R9431 Abnormal electrocardiogram [ECG] [EKG]: Secondary | ICD-10-CM

## 2022-05-14 MED ORDER — METFORMIN HCL ER 500 MG PO TB24: ORAL_TABLET | ORAL | 3 refills | Status: DC

## 2022-05-14 MED ORDER — TRIAMCINOLONE ACETONIDE 0.5 % EX CREA: 1.0000 | TOPICAL_CREAM | Freq: Two times a day (BID) | CUTANEOUS | 2 refills | Status: DC

## 2022-05-14 MED ORDER — AMLODIPINE BESYLATE 5 MG PO TABS: ORAL_TABLET | ORAL | 3 refills | Status: DC

## 2022-05-14 MED ORDER — LISINOPRIL-HYDROCHLOROTHIAZIDE 10-12.5 MG PO TABS: 1.0000 | ORAL_TABLET | Freq: Every day | ORAL | 3 refills | Status: DC

## 2022-05-14 MED ORDER — LEVOTHYROXINE SODIUM 50 MCG PO TABS: ORAL_TABLET | ORAL | 1 refills | Status: DC

## 2022-05-14 MED ORDER — EMPAGLIFLOZIN 25 MG PO TABS: ORAL_TABLET | ORAL | 1 refills | Status: DC

## 2022-05-14 MED ORDER — TRAZODONE HCL 150 MG PO TABS: 150.0000 mg | ORAL_TABLET | Freq: Every day | ORAL | 2 refills | Status: DC

## 2022-05-14 MED ORDER — ATORVASTATIN CALCIUM 40 MG PO TABS: ORAL_TABLET | ORAL | 3 refills | Status: DC

## 2022-05-14 MED ORDER — GLIPIZIDE 10 MG PO TABS: ORAL_TABLET | ORAL | 3 refills | Status: DC

## 2022-05-14 MED ORDER — MIRABEGRON ER 50 MG PO TB24: 50.0000 mg | ORAL_TABLET | Freq: Every day | ORAL | 3 refills | Status: DC

## 2022-05-14 NOTE — Progress Notes (Signed)
CPE  Assessment and Plan   Encounter for Annual Physical Exam with abnormal findings Due annually  Health Maintenance reviewed Healthy lifestyle reviewed and goals set  Essential hypertension Controlled Continue Amlodipine, Lisinopril-HCTZ Discussed DASH (Dietary Approaches to Stop Hypertension) DASH diet is lower in sodium than a typical American diet. Cut back on foods that are high in saturated fat, cholesterol, and trans fats. Eat more whole-grain foods, fish, poultry, and nuts Remain active and exercise as tolerated daily.  Monitor BP at home-Call if greater than 130/80.  Check CMP/CBC  RBBB/Prolonged QT RBBB stable on recent EKG no concerning sx Monitor medications  Type 2 diabetes mellitus with stage 3 chronic kidney disease, without long-term current use of insulin (HCC) Continue Empagliflozin, Glipizide Education: Reviewed 'ABCs' of diabetes management  Discussed goals to be met and/or maintained include A1C (<7) Blood pressure (<130/80) Cholesterol (LDL <70) Continue Eye Exam yearly  Continue Dental Exam Q6 mo Discussed dietary recommendations Discussed Physical Activity recommendations Check A1C   Hyperlipidemia associated with T2DM (HCC) Continue Atorvastatin Discussed lifestyle modifications. Recommended diet heavy in fruits and veggies, omega 3's. Decrease consumption of animal meats, cheeses, and dairy products. Remain active and exercise as tolerated. Continue to monitor. Check lipids/TSH   CKD IIIa associated with T2DM ([place]) Discussed how what you eat and drink can aide in kidney protection. Stay well hydrated. Avoid high salt foods. Avoid NSAIDS. Keep BP and BG well controlled.   Take medications as prescribed. Remain active and exercise as tolerated daily. Maintain weight.  Continue to monitor. Check CMP/GFR/Microablumin   Hypothyroidism, unspecified type Controlled. Continue Levothyroxine. Reminded to take on an empty stomach  30-79mns before food.  Stop any Biotin Supplement 48-72 hours before next TSH level to reduce the risk of falsely low TSH levels. Continue to monitor.    Cervical radiculopathy at C6 Continue Tramadol PRN Continue to monitor  Vitamin D deficiency Continue supplementation for goal of 60-100 Check vitamin D level  Insomnia Continue Trazodone Discussed good sleep hygiene. Establish bed and wake times. Sleep restriction-only sleep estimated hrs sleep. Bed only for sex and sleep, only sleep when sleepy, out of bed if anxious (stimulus control). Reviewed relaxation techniques, mindful meditations. Expected sleep duration. Addressed worries about not sleeping.    Medication management All medications discussed and reviewed in full. All questions and concerns regarding medications addressed.    Osteopenia Completed 12/2021 Overdue - order placed  Right hand arthritis  Continue tylenol, voltaren or aspercreme 3-4 times daily Managing fairly   Orders Placed This Encounter  Procedures   DG Bone Density    Standing Status:   Future    Standing Expiration Date:   05/15/2023    Order Specific Question:   Reason for Exam (SYMPTOM  OR DIAGNOSIS REQUIRED)    Answer:   Osteopenia - biannual screening    Order Specific Question:   Preferred imaging location?    [facility]  MM Digital Screening    Standing Status:   Future    Standing Expiration Date:   05/15/2023    Order Specific Question:   Reason for Exam (SYMPTOM  OR DIAGNOSIS REQUIRED)    Answer:   yearly screening    Order Specific Question:   Preferred imaging location?    Answer:   [facility]   CBC with Differential/Platelet   COMPLETE METABOLIC PANEL WITH GFR   Magnesium   Lipid panel   TSH   Hemoglobin A1c   Insulin, random  VITAMIN D 25 Hydroxy (Vit-D Deficiency, Fractures)   Urinalysis, Routine w reflex microscopic   Microalbumin / creatinine urine ratio   Cologuard   EKG 12-Lead   Meds  ordered this encounter  Medications   amLODipine (NORVASC) 5 MG tablet    Sig: TAKE 1 TABLET EVERY DAY FOR BLOOD PRESSURE    Dispense:  90 tablet    Refill:  3   atorvastatin (LIPITOR) 40 MG tablet    Sig: Take 1 tablet Daily for Cholesterol    Dispense:  90 tablet    Refill:  3   glipiZIDE (GLUCOTROL) 10 MG tablet    Sig: TAKE 1/2-1 TABLET BY MOUTH 2  TIMES DAILY WITH MEALS. Skip this medication if low sugars, throwing up or not eating.    Dispense:  180 tablet    Refill:  3    Requesting 1 year supply   empagliflozin (JARDIANCE) 25 MG TABS tablet    Sig: START 1/2 TAB DAILY FOR 1 MONTH THEN INCREASE TO 1 TAB DAILY.    Dispense:  90 tablet    Refill:  1   levothyroxine (SYNTHROID) 50 MCG tablet    Sig: TAKE 1 TABLET DAILY ON AN EMPTY STOMACH WITH ONLY WATER FOR 30 MINUTES    Dispense:  90 tablet    Refill:  1   lisinopril-hydrochlorothiazide (ZESTORETIC) 10-12.5 MG tablet    Sig: Take 1 tablet by mouth daily.    Dispense:  90 tablet    Refill:  3   metFORMIN (GLUCOPHAGE-XR) 500 MG 24 hr tablet    Sig: Take 2 tablets 2 x /day with Meals for Diabetes    Dispense:  360 tablet    Refill:  3   mirabegron ER (MYRBETRIQ) 50 MG TB24 tablet    Sig: Take 1 tablet (50 mg total) by mouth daily.    Dispense:  90 tablet    Refill:  3   traZODone (DESYREL) 150 MG tablet    Sig: Take 1 tablet (150 mg total) by mouth at bedtime. Take 1/2 to 1 tablet at bedtime as needed for sleep    Dispense:  90 tablet    Refill:  2   triamcinolone cream (KENALOG) 0.5 %    Sig: Apply 1 Application topically 2 (two) times daily.    Dispense:  30 g    Refill:  2   Notify office for further evaluation and treatment, questions or concerns if any reported s/s fail to improve.   The patient was advised to call back or seek an in-person evaluation if any symptoms worsen or if the condition fails to improve as anticipated.   Further disposition pending results of labs. Discussed med's effects and SE's.     I discussed the assessment and treatment plan with the patient. The patient was provided an opportunity to ask questions and all were answered. The patient agreed with the plan and demonstrated an understanding of the instructions.  Discussed med's effects and SE's. Screening labs and tests as requested with regular follow-up as recommended.  I provided 35 minutes of face-to-face time during this encounter including counseling, chart review, and critical decision making was preformed.  Future Appointments  Date Time Provider [place]  08/12/2022 10:00 AM Alycia Rossetti, NP [facility] None  05/17/2023  2:00 PM Darrol Jump, NP [facility] None     Subjective:  ONDA PRY is a 77 y.o. female who presents for CPE and 3 month follow up. She has Essential hypertension; Cervical  radiculopathy at C6; Type 2 diabetes mellitus ([place]); Hypothyroidism; Hyperlipidemia associated with type 2 diabetes mellitus ([place]); Vitamin D deficiency; Medication management; CKD stage 3 due to type 2 diabetes mellitus ([place]); BMI 20.0-20.9, adult; Insomnia; RBBB; Arthritis of right hand; Arthritis of left hand; B12 deficiency; and Osteopenia on their problem list.   She is married, has 1 adopted daughter, retired Pharmacist, hospital. Spends a lot of time at [place], recently back from [place].  She also travels with her husband as he participates in marathons.  They also like attending [facility] [employer] [facility].  She is taking trazodone for insomnia occasionally if needed, takes when traveling. She has hx of cervical fusion for radiculopathy at C6; she takes tramadol PRN rarely for pain while on long trips.   She reports injured R hand 01/2020, was having pain in 4th digit, xray showed no fracture but arthritic changes, managing with tylenol, topical voltaren.   She is following with urology Dr. Hollice Espy for OAB, recently given gemtesa but reports very expensive, lack of benefit, would like to switch back  to myrbetriq. Reports effective.  BMI is Body mass index is 20.26 kg/m., she has been working on diet and exercise, eating less, walking 1-1.5 hours daily if weather is good Wt Readings from Last 3 Encounters:  05/14/22 116 lb 3.2 oz (52.7 kg)  12/24/21 115 lb 3.2 oz (52.3 kg)  09/16/21 115 lb (52.2 kg)   She has had elevated blood pressure for years. Her blood pressure has been controlled at home, today their BP is BP: 128/72  She does workout. She denies chest pain, shortness of breath, dizziness.    She has diabetes type 2  with CKD IIIa - jardiance, ACEi With hyperlipidemia on lipitor 40 mg not at goal less than 70  with metformin 2000 mg daily, glipizide 10 mg TID, newly on jardiance 25 mg daily  and denies foot ulcerations, hyperglycemia, hypoglycemia , increased appetite, nausea, paresthesia of the feet, polydipsia, polyuria, visual disturbances, vomiting and weight loss.  She reports fasting ranges 120-130 or so but admits not checking daily and feels as though A1c may be elevated d/t holidays.  Last A1C in the office was:  Lab Results  Component Value Date   HGBA1C 8.3 (H) 12/24/2021    She is on cholesterol medication, Atorvastatin and denies myalgias. Her cholesterol is at goal. The cholesterol last visit was:   Lab Results  Component Value Date   CHOL 132 12/24/2021   HDL 57 12/24/2021   LDLCALC 56 12/24/2021   TRIG 105 12/24/2021   CHOLHDL 2.3 12/24/2021    She has CKD III associated with T2DM monitored at this office:  Lab Results  Component Value Date   GFRNONAA 44 (L) 09/16/2020   Patient is on Vitamin D supplement, takes 200 mg BID with calcium.    Lab Results  Component Value Date   VD25OH 48 12/24/2021     She is on thyroid medication. Her medication was not changed last visit. Taking 50 mcg daily.  Lab Results  Component Value Date   TSH 3.65 12/24/2021   She is not currenlty on B12 supplement Lab Results  Component Value Date   VITAMINB12 368  05/14/2020   Medication Review:  Current Outpatient Medications (Endocrine & Metabolic):    empagliflozin (JARDIANCE) 25 MG TABS tablet, START 1/2 TAB DAILY FOR 1 MONTH THEN INCREASE TO 1 TAB DAILY.   glipiZIDE (GLUCOTROL) 10 MG tablet, TAKE 1/2-1 TABLET BY MOUTH 2  TIMES DAILY WITH  MEALS. Skip this medication if low sugars, throwing up or not eating.   levothyroxine (SYNTHROID) 50 MCG tablet, TAKE 1 TABLET DAILY ON AN EMPTY STOMACH WITH ONLY WATER FOR 30 MINUTES   metFORMIN (GLUCOPHAGE-XR) 500 MG 24 hr tablet, Take 2 tablets 2 x /day with Meals for Diabetes  Current Outpatient Medications (Cardiovascular):    amLODipine (NORVASC) 5 MG tablet, TAKE 1 TABLET EVERY DAY FOR BLOOD PRESSURE   atorvastatin (LIPITOR) 40 MG tablet, Take 1 tablet Daily for Cholesterol   lisinopril-hydrochlorothiazide (ZESTORETIC) 10-12.5 MG tablet, Take 1 tablet by mouth daily.     Current Outpatient Medications (Other):    Biotin 10 MG TABS, Take by mouth daily.   Calcium Carbonate-Vitamin D (CALCIUM-VITAMIN D) 500-200 MG-UNIT per tablet, Take 1 tablet by mouth 2 (two) times daily with a meal.   CINNAMON PO, Take 1,000 Units by mouth daily. 2 capsules daily   Magnesium 250 MG TABS, Take 250 mg by mouth daily.   mirabegron ER (MYRBETRIQ) 50 MG TB24 tablet, Take 1 tablet (50 mg total) by mouth daily.   traZODone (DESYREL) 150 MG tablet, Take 1 tablet (150 mg total) by mouth at bedtime. Take 1/2 to 1 tablet at bedtime as needed for sleep   triamcinolone cream (KENALOG) 0.5 %, Apply 1 Application topically 2 (two) times daily.  Current Problems (verified) Patient Active Problem List   Diagnosis Date Noted   Osteopenia 11/04/2020   B12 deficiency 09/16/2020   Arthritis of left hand 05/16/2020   Arthritis of right hand 05/14/2020   RBBB 02/08/2018   Insomnia 10/27/2017   CKD stage 3 due to type 2 diabetes mellitus ([place]) 06/27/2017   BMI 20.0-20.9, adult 06/27/2017   Medication management 03/08/2014    Hyperlipidemia associated with type 2 diabetes mellitus ([place]) 03/21/2013   Vitamin D deficiency 03/21/2013   Type 2 diabetes mellitus ([place]) 03/17/2013   Hypothyroidism    Cervical radiculopathy at C6 10/22/2011   Essential hypertension 07/01/2009    Screening Tests Immunization History  Administered Date(s) Administered   Influenza Split 02/13/2013   Influenza, High Dose Seasonal PF 12/07/2014, 12/09/2015, 12/15/2016, 02/08/2018, 12/16/2018, 02/20/2021, 12/03/2021   Influenza-Unspecified 03/26/2014   PFIZER(Purple Top)SARS-COV-2 Vaccination 04/20/2019, 05/18/2019, 12/25/2019, 09/21/2020   Pneumococcal Conjugate-13 12/09/2015   Pneumococcal Polysaccharide-23 05/14/2021   Pneumococcal-Unspecified 03/23/2008   Tdap 07/17/2010, 12/03/2021   Zoster Recombinat (Shingrix) 12/16/2018, 04/06/2019   Zoster, Live 01/14/2012   Health Maintenance  Topic Date Due   COVID-19 Vaccine (5 - 2023-24 season) 11/21/2021   Diabetic kidney evaluation - Urine ACR  05/14/2022   FOOT EXAM  05/14/2022   OPHTHALMOLOGY EXAM  05/30/2022   HEMOGLOBIN A1C  06/25/2022   Medicare Annual Wellness (AWV)  09/17/2022   Fecal DNA (Cologuard)  09/19/2022   DEXA SCAN  11/01/2022   Diabetic kidney evaluation - eGFR measurement  12/25/2022   DTaP/Tdap/Td (3 - Td or Tdap) 12/04/2031   Pneumonia Vaccine 38+ Years old  Completed   INFLUENZA VACCINE  Completed   Hepatitis C Screening  Completed   Zoster Vaccines- Shingrix  Completed   HPV VACCINES  Aged Out   Shingrix: 2/2 2021 Covid 19: requested booster date  Last colonoscopy: 2006 cologuard 08/2019 Negative Due - ordered.  Last mamm[facility]: [facility] - due - order placed DEXA 11/02/2020 Spine T -1.9 - breast center mobile unit Due - ordered  Names of Other Physician/Practitioners you currently use: 1. [facility] here for primary care 2. Dr.  Fox at [facility], eye doctor, last visit 12/21/2017-  GOING TO [place] OV  3. [facility], dentist, last visit 2023, goes q32m4. Dr. ?Marland KitchenDermatologist, last visit 2023  Patient Care Team: MUnk Pinto MD as PCP - General (Internal Medicine) MBurnell Blanks MD as Consulting Physician (Cardiology) MJuanita Craver MD as Consulting Physician (Gastroenterology) DPhylliss Bob MD as Consulting Physician (Orthopedic Surgery) NMaisie Fus MD (Inactive) as Consulting Physician (Obstetrics and Gynecology)  Allergies No Known Allergies  SURGICAL HISTORY She  has a past surgical history that includes Breast surgery (80's); Anterior cervical decomp/discectomy fusion (10/22/2011); and Breast excisional biopsy (Left). FAMILY HISTORY Her family history includes Alzheimer's disease in her father, paternal aunt, and paternal uncle; Diabetes in her maternal aunt, maternal grandmother, and maternal uncle; Drug abuse in her maternal grandmother; Heart disease in her mother; Hyperlipidemia in her mother; Hypertension in her mother. SOCIAL HISTORY She  reports that she has never smoked. She has never used smokeless tobacco. She reports current alcohol use of about 7.0 standard drinks of alcohol per week. She reports that she does not use drugs.   Review of Systems  Constitutional:  Negative for chills, fever, malaise/fatigue and weight loss.  HENT:  Negative for congestion, ear pain, hearing loss, nosebleeds, sore throat and tinnitus.   Eyes:  Negative for blurred vision and double vision.  Respiratory:  Negative for cough, shortness of breath and wheezing.   Cardiovascular:  Negative for chest pain, palpitations, orthopnea, claudication and leg swelling.  Gastrointestinal:  Negative for abdominal pain, blood in stool, constipation, diarrhea, heartburn, melena, nausea and vomiting.  Genitourinary: Negative.        Mild stress incontinence  Musculoskeletal:  Positive for joint pain (R hand left finger). Negative for myalgias.  Skin:  Negative for  rash.  Neurological:  Negative for dizziness, tingling, sensory change, loss of consciousness, weakness and headaches.  Endo/Heme/Allergies:  Negative for polydipsia.  Psychiatric/Behavioral:  Negative for depression, memory loss and substance abuse. The patient is not nervous/anxious and does not have insomnia.   All other systems reviewed and are negative.    Objective:     Today's Vitals   05/14/22 1351  BP: 128/72  Pulse: 61  Temp: 98.2 F (36.8 C)  SpO2: 98%  Weight: 116 lb 3.2 oz (52.7 kg)  Height: 5' 3.5" (1.613 m)   Body mass index is 20.26 kg/m.  General appearance: thin, alert, no distress, WD/WN, female HEENT: normocephalic, sclerae anicteric, TMs pearly, nares patent, no discharge or erythema, pharynx normal Oral cavity: Poor dentition; left lower jaw with teeth absent, erythematous gingiva with ? Scant purulent discharge; front teeth cracked/chipped; otherwise MM intact, uvula midline, sublingual glands non-inflamed Neck: supple, no lymphadenopathy, no thyromegaly, no masses Heart: RRR, normal S1, subtle spit S2, no murmurs Lungs: CTA bilaterally, no wheezes, rhonchi, or rales Abdomen: +bs, soft, non tender, non distended, no masses, no hepatomegaly, no splenomegaly Musculoskeletal: nontender, no swelling, no obvious deformity, symmetrical strength excepting R 4th digit with limited flexion, pain with extension, mild crepitus PIP joint without erythema/swelling. Distal neurovascular intact. No palpable palmar growth.  Extremities: no edema, no cyanosis, no clubbing Pulses: 2+ symmetric, upper and lower extremities, normal cap refill Neurological: alert, oriented x 3, CN2-12 intact, strength normal upper extremities and lower extremities, sensation normal throughout, DTRs 2+ throughout, no cerebellar signs, gait normal. Sensation intact to monofilament bil feet.  Psychiatric: normal affect, behavior normal, pleasant  Skin: Warm/dry, intact, without notable lesions,  ecchymosis, rash  EKG:  NSR, RBBB, prolonged QT    Perry Molla, NP   05/14/2022

## 2022-05-14 NOTE — Patient Instructions (Signed)

## 2022-05-15 LAB — URINALYSIS, ROUTINE W REFLEX MICROSCOPIC
Bilirubin Urine: NEGATIVE
Hgb urine dipstick: NEGATIVE
Ketones, ur: NEGATIVE
Leukocytes,Ua: NEGATIVE
Nitrite: NEGATIVE
Protein, ur: NEGATIVE
Specific Gravity, Urine: 1.023 (ref 1.001–1.035)
pH: <=5 (ref 5.0–8.0)

## 2022-05-15 LAB — COMPLETE METABOLIC PANEL WITH GFR
AG Ratio: 1.7 (calc) (ref 1.0–2.5)
ALT: 21 U/L (ref 6–29)
AST: 16 U/L (ref 10–35)
Albumin: 4.3 g/dL (ref 3.6–5.1)
Alkaline phosphatase (APISO): 76 U/L (ref 37–153)
BUN/Creatinine Ratio: 22 (calc) (ref 6–22)
BUN: 22 mg/dL (ref 7–25)
CO2: 28 mmol/L (ref 20–32)
Calcium: 9.9 mg/dL (ref 8.6–10.4)
Chloride: 101 mmol/L (ref 98–110)
Creat: 1.02 mg/dL — ABNORMAL HIGH (ref 0.60–1.00)
Globulin: 2.5 g/dL (calc) (ref 1.9–3.7)
Glucose, Bld: 153 mg/dL — ABNORMAL HIGH (ref 65–99)
Potassium: 4.5 mmol/L (ref 3.5–5.3)
Sodium: 139 mmol/L (ref 135–146)
Total Bilirubin: 0.5 mg/dL (ref 0.2–1.2)
Total Protein: 6.8 g/dL (ref 6.1–8.1)
eGFR: 57 mL/min/{1.73_m2} — ABNORMAL LOW (ref >=60)

## 2022-05-15 LAB — CBC WITH DIFFERENTIAL/PLATELET
Absolute Monocytes: 373 cells/uL (ref 200–950)
Basophils Absolute: 57 cells/uL (ref 0–200)
Basophils Relative: 0.7 %
Eosinophils Absolute: 113 cells/uL (ref 15–500)
Eosinophils Relative: 1.4 %
HCT: 45.9 % — ABNORMAL HIGH (ref 35.0–45.0)
Hemoglobin: 15.3 g/dL (ref 11.7–15.5)
Lymphs Abs: 1928 cells/uL (ref 850–3900)
MCH: 28.7 pg (ref 27.0–33.0)
MCHC: 33.3 g/dL (ref 32.0–36.0)
MCV: 86.1 fL (ref 80.0–100.0)
MPV: 11.2 fL (ref 7.5–12.5)
Monocytes Relative: 4.6 %
Neutro Abs: 5630 cells/uL (ref 1500–7800)
Neutrophils Relative %: 69.5 %
Platelets: 312 10*3/uL (ref 140–400)
RBC: 5.33 10*6/uL — ABNORMAL HIGH (ref 3.80–5.10)
RDW: 12.7 % (ref 11.0–15.0)
Total Lymphocyte: 23.8 %
WBC: 8.1 10*3/uL (ref 3.8–10.8)

## 2022-05-15 LAB — HEMOGLOBIN A1C
Hgb A1c MFr Bld: 9.2 % of total Hgb — ABNORMAL HIGH (ref <5.7)
Mean Plasma Glucose: 217 mg/dL
eAG (mmol/L): 12 mmol/L

## 2022-05-15 LAB — LIPID PANEL
Cholesterol: 203 mg/dL — ABNORMAL HIGH (ref <200)
HDL: 71 mg/dL (ref >=50)
LDL Cholesterol (Calc): 106 mg/dL (calc) — ABNORMAL HIGH
Non-HDL Cholesterol (Calc): 132 mg/dL (calc) — ABNORMAL HIGH (ref <130)
Total CHOL/HDL Ratio: 2.9 (calc) (ref <5.0)
Triglycerides: 147 mg/dL (ref <150)

## 2022-05-15 LAB — MAGNESIUM: Magnesium: 1.9 mg/dL (ref 1.5–2.5)

## 2022-05-15 LAB — VITAMIN D 25 HYDROXY (VIT D DEFICIENCY, FRACTURES): Vit D, 25-Hydroxy: 43 ng/mL (ref 30–100)

## 2022-05-15 LAB — TSH: TSH: 3.18 mIU/L (ref 0.40–4.50)

## 2022-05-15 LAB — INSULIN, RANDOM: Insulin: 3.8 u[IU]/mL

## 2022-05-15 LAB — MICROALBUMIN / CREATININE URINE RATIO
Creatinine, Urine: 57 mg/dL (ref 20–275)
Microalb Creat Ratio: 9 mcg/mg creat (ref <30)
Microalb, Ur: 0.5 mg/dL

## 2022-08-10 NOTE — Progress Notes (Deleted)
Patient ID: [name], female   DOB: 14-Nov-1945, 77 y.o.   MRN: [id number]   [facility] VISIT AND FOLLOW UP   Assessment and Plan    Annual Medicare Wellness Visit Due annually  Health maintenance reviewed  Essential hypertension - continue medications, DASH diet, exercise and monitor at home. Call if greater than 130/80.   RBBB  Stable on recent EKG, no concerning sx, monitor   Type 2 diabetes mellitus with stage 3 chronic kidney disease, without long-term current use of insulin (HCC) Discussed general issues about diabetes pathophysiology and management., Educational material distributed., Suggested low cholesterol diet., Encouraged aerobic exercise., Discussed foot care., reminded annual eye exam  -     Hemoglobin A1c  Hyperlipidemia associated with T2DM (HCC) Continue statin for LDL goal <70 check lipids, decrease fatty foods, increase activity.  -     Lipid panel  CKD IIIa associated with T2DM (HCC) Increase fluids, avoid NSAIDS, monitor sugars, will monitor      -      CMP/GFR  Hypothyroidism, unspecified type -check TSH level, continue medications the same, reminded to take on an empty stomach 30-53mins before food.  -     TSH  Cervical radiculopathy at C6 s/p fusion, takes tramadol PRN, mainly for travel 20 tabs lasted over 12 months per PDMP review  Vitamin D deficiency Continue supplementation Check vitamin D level  Insomnia - trazodone is beneficial  - good sleep hygiene discussed, increase day time activity  Medication management -     Magnesium  Osteopenia - get dexa q2y, continue Vit D and Ca, weight bearing exercises  Right hand arthritis  Continue tylenol, voltaren or aspercreme 3-4 times daily Managing fairly  Plan hand specialist referral if worsening   Prolonged QT Resolved on EKG recheck today -    BMi 20 Continue to recommend diet heavy in fruits and veggies and low in animal meats, cheeses, and dairy products,  appropriate calorie intake Discuss exercise recommendations routinely Continue to monitor weight at home and each visit - advised to contact office if continues to trend down    Over 40 minutes of exam, counseling, chart review and critical decision making was performed Future Appointments  Date Time Provider Department Center  08/12/2022 10:00 AM [name], NP [facility] None  05/17/2023  2:00 PM Adela Glimpse, NP [facility] None     Plan:   During the course of the visit the patient was educated and counseled about appropriate screening and preventive services including:   Pneumococcal vaccine  Prevnar 13 Influenza vaccine Td vaccine Screening electrocardiogram Bone densitometry screening Colorectal cancer screening Diabetes screening Glaucoma screening Nutrition counseling  Advanced directives: requested    Subjective:  Elizabeth May is a 77 y.o. female who presents for AWV and 3 month follow up. She has Essential hypertension; Cervical radiculopathy at C6; Type 2 diabetes mellitus (HCC); Hypothyroidism; Hyperlipidemia associated with type 2 diabetes mellitus (HCC); Vitamin D deficiency; Medication management; CKD stage 3 due to type 2 diabetes mellitus (HCC); BMI 20.0-20.9, adult; Insomnia; RBBB; Arthritis of right hand; Arthritis of left hand; B12 deficiency; and Osteopenia on their problem list.    She is married, has 1 adopted daughter (first grandchild due Oct 2023), retired Runner, broadcasting/film/video. Spends a lot of time at [place], she is headed out to [place] trip tomorrow.   She is concerned today about pruritic lesions to legs, ? Got at beach, has kept covered but very itchy.   She is taking  trazodone for insomnia occasionally if needed, takes when traveling. She has hx of cervical fusion for radiculopathy at C6; she takes tramadol PRN rarely for pain while on long trips. Last filled 20 tabs 05/14/2020.   She is following with urology Dr.  Vanna May for OAB, currently on myrbetriq 50 mg daily with benefit.    BMI is There is no height or weight on file to calculate BMI., she has been working on diet and exercise, eating less, walking 1-1.5 hours daily if weather is good. She drinks glass of wine most evenings. Admits needs to increase water intake could be better. Generally eats healthy, minimal starches. Losing weight since walking more.  Wt Readings from Last 3 Encounters:  05/14/22 116 lb 3.2 oz (52.7 kg)  12/24/21 115 lb 3.2 oz (52.3 kg)  09/16/21 115 lb (52.2 kg)   She has had elevated blood pressure for years. Her blood pressure has been controlled at home, today their BP is   She does workout. She denies chest pain, shortness of breath, dizziness.    She has diabetes type 2  with CKD IIIa - jardiance, ACEi With hyperlipidemia on lipitor 40 mg not at goal less than 70  with metformin 2000 mg daily, glipizide 10 mg TID, newly on jardiance 25 mg daily  and denies foot ulcerations, hyperglycemia, hypoglycemia , increased appetite, nausea, paresthesia of the feet, polydipsia, polyuria, visual disturbances, vomiting and weight loss.  She reports fasting ranges 127-130 or so but admits not checking daily  Last A1C in the office was:  Lab Results  Component Value Date   HGBA1C 9.2 (H) 05/14/2022   Lab Results  Component Value Date   CHOL 203 (H) 05/14/2022   HDL 71 05/14/2022   LDLCALC 106 (H) 05/14/2022   TRIG 147 05/14/2022   CHOLHDL 2.9 05/14/2022   She has CKD III associated with T2DM monitored at this office:  Lab Results  Component Value Date   EGFR 57 (L) 05/14/2022   Patient is on Vitamin D supplement, takes 200 mg BID with calcium.    Lab Results  Component Value Date   VD25OH 65 05/14/2022     She is on thyroid medication. Her medication was not changed last visit. Taking 50 mcg daily.  Lab Results  Component Value Date   TSH 3.18 05/14/2022   She is not currenlty on B12 supplement Lab  Results  Component Value Date   VITAMINB12 368 05/14/2020      Medication Review:  Current Outpatient Medications (Endocrine & Metabolic):    empagliflozin (JARDIANCE) 25 MG TABS tablet, START 1/2 TAB DAILY FOR 1 MONTH THEN INCREASE TO 1 TAB DAILY.   glipiZIDE (GLUCOTROL) 10 MG tablet, TAKE 1/2-1 TABLET BY MOUTH 2  TIMES DAILY WITH MEALS. Skip this medication if low sugars, throwing up or not eating.   levothyroxine (SYNTHROID) 50 MCG tablet, TAKE 1 TABLET DAILY ON AN EMPTY STOMACH WITH ONLY WATER FOR 30 MINUTES   metFORMIN (GLUCOPHAGE-XR) 500 MG 24 hr tablet, Take 2 tablets 2 x /day with Meals for Diabetes  Current Outpatient Medications (Cardiovascular):    amLODipine (NORVASC) 5 MG tablet, TAKE 1 TABLET EVERY DAY FOR BLOOD PRESSURE   atorvastatin (LIPITOR) 40 MG tablet, Take 1 tablet Daily for Cholesterol   lisinopril-hydrochlorothiazide (ZESTORETIC) 10-12.5 MG tablet, Take 1 tablet by mouth daily.     Current Outpatient Medications (Other):    Biotin 10 MG TABS, Take by mouth daily.   Calcium Carbonate-Vitamin D (CALCIUM-VITAMIN D) 500-200  MG-UNIT per tablet, Take 1 tablet by mouth 2 (two) times daily with a meal.   CINNAMON PO, Take 1,000 Units by mouth daily. 2 capsules daily   Magnesium 250 MG TABS, Take 250 mg by mouth daily.   mirabegron ER (MYRBETRIQ) 50 MG TB24 tablet, Take 1 tablet (50 mg total) by mouth daily.   traZODone (DESYREL) 150 MG tablet, Take 1 tablet (150 mg total) by mouth at bedtime. Take 1/2 to 1 tablet at bedtime as needed for sleep   triamcinolone cream (KENALOG) 0.5 %, Apply 1 Application topically 2 (two) times daily.  Current Problems (verified) Patient Active Problem List   Diagnosis Date Noted   Osteopenia 11/04/2020   B12 deficiency 09/16/2020   Arthritis of left hand 05/16/2020   Arthritis of right hand 05/14/2020   RBBB 02/08/2018   Insomnia 10/27/2017   CKD stage 3 due to type 2 diabetes mellitus (HCC) 06/27/2017   BMI 20.0-20.9, adult  06/27/2017   Medication management 03/08/2014   Hyperlipidemia associated with type 2 diabetes mellitus (HCC) 03/21/2013   Vitamin D deficiency 03/21/2013   Type 2 diabetes mellitus (HCC) 03/17/2013   Hypothyroidism    Cervical radiculopathy at C6 10/22/2011   Essential hypertension 07/01/2009    Screening Tests Immunization History  Administered Date(s) Administered   Influenza Split 02/13/2013   Influenza, High Dose Seasonal PF 12/07/2014, 12/09/2015, 12/15/2016, 02/08/2018, 12/16/2018, 02/20/2021, 12/03/2021   Influenza-Unspecified 03/26/2014   PFIZER(Purple Top)SARS-COV-2 Vaccination 04/20/2019, 05/18/2019, 12/25/2019, 09/21/2020   Pneumococcal Conjugate-13 12/09/2015   Pneumococcal Polysaccharide-23 05/14/2021   Pneumococcal-Unspecified 03/23/2008   Tdap 07/17/2010, 12/03/2021   Zoster Recombinat (Shingrix) 12/16/2018, 04/06/2019   Zoster, Live 01/14/2012   Health Maintenance  Topic Date Due   COVID-19 Vaccine (5 - 2023-24 season) 11/21/2021   FOOT EXAM  05/14/2022   OPHTHALMOLOGY EXAM  05/30/2022   Medicare Annual Wellness (AWV)  09/17/2022   Fecal DNA (Cologuard)  09/19/2022   INFLUENZA VACCINE  10/22/2022   DEXA SCAN  11/01/2022   HEMOGLOBIN A1C  11/12/2022   Diabetic kidney evaluation - eGFR measurement  05/15/2023   Diabetic kidney evaluation - Urine ACR  05/15/2023   DTaP/Tdap/Td (3 - Td or Tdap) 12/04/2031   Pneumonia Vaccine 86+ Years old  Completed   Hepatitis C Screening  Completed   Zoster Vaccines- Shingrix  Completed   HPV VACCINES  Aged Out   Shingrix: 2/2 2021 Covid 19: requested booster date  Last colonoscopy: 2006 cologuard 08/2019  Last mammogram: 11/2020 at breast center mobile unit DEXA 11/02/2020 Spine T -1.9 - breast center mobile unit  Names of Other Physician/Practitioners you currently use: 1. [facility] here for primary care 2. Dr. Rae Mar at [facility], eye doctor, last visit 05/29/2021, no  retinopathy, + new cataract, monitoring  3. [facility], dentist, last visit 2023, goes q54m, has partials 4. Dr. Marland Kitchen Dermatologist, last visit 2022, goes PRN  Patient Care Team: [name], MD as PCP - General (Internal Medicine) Kathleene Hazel, MD as Consulting Physician (Cardiology) Charna Elizabeth, MD as Consulting Physician (Gastroenterology) Estill Bamberg, MD as Consulting Physician (Orthopedic Surgery) Freddy Finner, MD (Inactive) as Consulting Physician (Obstetrics and Gynecology)  Allergies No Known Allergies  SURGICAL HISTORY She  has a past surgical history that includes Breast surgery (80's); Anterior cervical decomp/discectomy fusion (10/22/2011); and Breast excisional biopsy (Left). FAMILY HISTORY Her family history includes Alzheimer's disease in her father, paternal aunt, and paternal uncle; Diabetes in her maternal aunt, maternal  grandmother, and maternal uncle; Drug abuse in her maternal grandmother; Heart disease in her mother; Hyperlipidemia in her mother; Hypertension in her mother. SOCIAL HISTORY She  reports that she has never smoked. She has never used smokeless tobacco. She reports current alcohol use of about 7.0 standard drinks of alcohol per week. She reports that she does not use drugs.  MEDICARE WELLNESS OBJECTIVES: Physical activity:   Cardiac risk factors:   Depression/mood screen:      09/16/2021   10:55 AM  Depression screen PHQ 2/9  Decreased Interest 0  Down, Depressed, Hopeless 0  PHQ - 2 Score 0    ADLs:     09/16/2021   10:52 AM  In your present state of health, do you have any difficulty performing the following activities:  Hearing? 0  Vision? 0  Difficulty concentrating or making decisions? 0  Walking or climbing stairs? 0  Dressing or bathing? 0  Doing errands, shopping? 0     Cognitive Testing  Alert? Yes  Normal Appearance?Yes  Oriented to person? Yes  Place? Yes   Time? Yes  Recall of three objects?  Yes  Can  perform simple calculations? Yes  Displays appropriate judgment?Yes  Can read the correct time from a watch face?Yes  EOL planning:       Review of Systems  Constitutional:  Negative for chills, fever, malaise/fatigue and weight loss.  HENT:  Negative for congestion, ear pain, hearing loss, nosebleeds, sore throat and tinnitus.   Eyes:  Negative for blurred vision and double vision.  Respiratory:  Negative for cough, shortness of breath and wheezing.   Cardiovascular:  Negative for chest pain, palpitations, orthopnea, claudication and leg swelling.  Gastrointestinal:  Negative for abdominal pain, blood in stool, constipation, diarrhea, heartburn, melena, nausea and vomiting.  Genitourinary: Negative.        Mild stress incontinence  Musculoskeletal:  Positive for neck pain (intermittent). Negative for joint pain and myalgias.  Skin:  Positive for itching (itchy lesions to legs). Negative for rash.  Neurological:  Negative for dizziness, tingling, sensory change, loss of consciousness, weakness and headaches.  Endo/Heme/Allergies:  Negative for polydipsia.  Psychiatric/Behavioral:  Negative for depression, memory loss and substance abuse. The patient is not nervous/anxious and does not have insomnia.   All other systems reviewed and are negative.    Objective:     There were no vitals filed for this visit.  There is no height or weight on file to calculate BMI.  General appearance: alert, no distress, WD/WN, female HEENT: normocephalic, sclerae anicteric, TMs pearly, nares patent, no discharge or erythema, pharynx normal Oral cavity: Poor dentition; left lower jaw with teeth absent, erythematous gingiva with ? Scant purulent discharge; front teeth cracked/chipped; otherwise MM intact, uvula midline, sublingual glands non-inflamed Neck: supple, no lymphadenopathy, no thyromegaly, no masses Heart: RRR, normal S1, subtle spit S2, no murmurs Lungs: CTA bilaterally, no wheezes,  rhonchi, or rales Abdomen: +bs, soft, non tender, non distended, no masses, no hepatomegaly, no splenomegaly Musculoskeletal: nontender, no swelling, no obvious deformity, symmetrical strength Extremities: no edema, no cyanosis, no clubbing Pulses: 2+ symmetric, upper and lower extremities, normal cap refill Neurological: alert, oriented x 3, CN2-12 intact, strength normal upper extremities and lower extremities, sensation normal throughout, DTRs 2+ throughout, no cerebellar signs, gait normal. Sensation intact to monofilament bil feet.  Psychiatric: normal affect, behavior normal, pleasant  Skin: Warm/dry, intact, without, ecchymosis, rash; she has scattered lesions with central excoriation/scabbing, very faint surrounding erythema to bil lower  legs.   EKG: Mild sinus brady, RBBB, QT 440 ms, WNL  Medicare Attestation I have personally reviewed: The patient's medical and social history Their use of alcohol, tobacco or illicit drugs Their current medications and supplements The patient's functional ability including ADLs,fall risks, home safety risks, cognitive, and hearing and visual impairment Diet and physical activities Evidence for depression or mood disorders  The patient's weight, height, BMI, and visual acuity have been recorded in the chart.  I have made referrals, counseling, and provided education to the patient based on review of the above and I have provided the patient with a written personalized care plan for preventive services.     [name], NP   08/10/2022

## 2022-08-12 ENCOUNTER — Ambulatory Visit: Payer: Medicare PPO | Admitting: Nurse Practitioner

## 2022-08-12 DIAGNOSIS — Z79899 Other long term (current) drug therapy: Secondary | ICD-10-CM

## 2022-08-12 DIAGNOSIS — M5412 Radiculopathy, cervical region: Secondary | ICD-10-CM

## 2022-08-12 DIAGNOSIS — Z0000 Encounter for general adult medical examination without abnormal findings: Secondary | ICD-10-CM

## 2022-08-12 DIAGNOSIS — M8580 Other specified disorders of bone density and structure, unspecified site: Secondary | ICD-10-CM

## 2022-08-12 DIAGNOSIS — E559 Vitamin D deficiency, unspecified: Secondary | ICD-10-CM

## 2022-08-12 DIAGNOSIS — N1831 Chronic kidney disease, stage 3a: Secondary | ICD-10-CM

## 2022-08-12 DIAGNOSIS — I4510 Unspecified right bundle-branch block: Secondary | ICD-10-CM

## 2022-08-12 DIAGNOSIS — E1122 Type 2 diabetes mellitus with diabetic chronic kidney disease: Secondary | ICD-10-CM

## 2022-08-12 DIAGNOSIS — G4700 Insomnia, unspecified: Secondary | ICD-10-CM

## 2022-08-12 DIAGNOSIS — I10 Essential (primary) hypertension: Secondary | ICD-10-CM

## 2022-08-12 DIAGNOSIS — E1169 Type 2 diabetes mellitus with other specified complication: Secondary | ICD-10-CM

## 2022-08-12 DIAGNOSIS — Z6820 Body mass index (BMI) 20.0-20.9, adult: Secondary | ICD-10-CM

## 2022-08-12 DIAGNOSIS — M19041 Primary osteoarthritis, right hand: Secondary | ICD-10-CM

## 2022-08-12 DIAGNOSIS — E039 Hypothyroidism, unspecified: Secondary | ICD-10-CM

## 2022-08-13 ENCOUNTER — Ambulatory Visit: Payer: Medicare PPO | Admitting: Nurse Practitioner

## 2022-08-14 ENCOUNTER — Ambulatory Visit: Payer: Medicare PPO | Admitting: Nurse Practitioner

## 2022-08-17 ENCOUNTER — Encounter: Payer: Self-pay | Admitting: Nurse Practitioner

## 2022-08-18 ENCOUNTER — Encounter: Payer: Self-pay | Admitting: Nurse Practitioner

## 2022-08-18 ENCOUNTER — Ambulatory Visit: Payer: Medicare PPO | Admitting: Nurse Practitioner

## 2022-08-18 VITALS — BP 140/74 | HR 66 | Temp 97.9°F | Wt 115.0 lb

## 2022-08-18 DIAGNOSIS — N183 Chronic kidney disease, stage 3 unspecified: Secondary | ICD-10-CM

## 2022-08-18 DIAGNOSIS — E1169 Type 2 diabetes mellitus with other specified complication: Secondary | ICD-10-CM

## 2022-08-18 DIAGNOSIS — E559 Vitamin D deficiency, unspecified: Secondary | ICD-10-CM

## 2022-08-18 DIAGNOSIS — I4510 Unspecified right bundle-branch block: Secondary | ICD-10-CM

## 2022-08-18 DIAGNOSIS — N1831 Chronic kidney disease, stage 3a: Secondary | ICD-10-CM | POA: Diagnosis not present

## 2022-08-18 DIAGNOSIS — Z6820 Body mass index (BMI) 20.0-20.9, adult: Secondary | ICD-10-CM

## 2022-08-18 DIAGNOSIS — E538 Deficiency of other specified B group vitamins: Secondary | ICD-10-CM

## 2022-08-18 DIAGNOSIS — Z0000 Encounter for general adult medical examination without abnormal findings: Secondary | ICD-10-CM

## 2022-08-18 DIAGNOSIS — R6889 Other general symptoms and signs: Secondary | ICD-10-CM | POA: Diagnosis not present

## 2022-08-18 DIAGNOSIS — E1122 Type 2 diabetes mellitus with diabetic chronic kidney disease: Secondary | ICD-10-CM

## 2022-08-18 DIAGNOSIS — Z1211 Encounter for screening for malignant neoplasm of colon: Secondary | ICD-10-CM

## 2022-08-18 DIAGNOSIS — M5412 Radiculopathy, cervical region: Secondary | ICD-10-CM

## 2022-08-18 DIAGNOSIS — I10 Essential (primary) hypertension: Secondary | ICD-10-CM

## 2022-08-18 DIAGNOSIS — M19041 Primary osteoarthritis, right hand: Secondary | ICD-10-CM

## 2022-08-18 DIAGNOSIS — Z0001 Encounter for general adult medical examination with abnormal findings: Secondary | ICD-10-CM | POA: Diagnosis not present

## 2022-08-18 DIAGNOSIS — E785 Hyperlipidemia, unspecified: Secondary | ICD-10-CM

## 2022-08-18 DIAGNOSIS — Z79899 Other long term (current) drug therapy: Secondary | ICD-10-CM | POA: Diagnosis not present

## 2022-08-18 DIAGNOSIS — E039 Hypothyroidism, unspecified: Secondary | ICD-10-CM

## 2022-08-18 DIAGNOSIS — M8580 Other specified disorders of bone density and structure, unspecified site: Secondary | ICD-10-CM

## 2022-08-18 DIAGNOSIS — Z1231 Encounter for screening mammogram for malignant neoplasm of breast: Secondary | ICD-10-CM

## 2022-08-18 DIAGNOSIS — G4700 Insomnia, unspecified: Secondary | ICD-10-CM

## 2022-08-18 LAB — CBC WITH DIFFERENTIAL/PLATELET
Basophils Relative: 1.1 %
Eosinophils Absolute: 128 cells/uL (ref 15–500)
Eosinophils Relative: 1.8 %
Hemoglobin: 14.8 g/dL (ref 11.7–15.5)
MCV: 88.8 fL (ref 80.0–100.0)
MPV: 10.4 fL (ref 7.5–12.5)
Platelets: 322 10*3/uL (ref 140–400)
RBC: 5.09 10*6/uL (ref 3.80–5.10)
Total Lymphocyte: 20.3 %

## 2022-08-18 NOTE — Progress Notes (Signed)
ANNUAL WELLNESS VISIT AND FOLLOW UP   Assessment and Plan    Annual Medicare Wellness Visit Due annually  Health maintenance reviewed  Essential hypertension Discussed DASH (Dietary Approaches to Stop Hypertension) DASH diet is lower in sodium than a typical American diet. Cut back on foods that are high in saturated fat, cholesterol, and trans fats. Eat more whole-grain foods, fish, poultry, and nuts Remain active and exercise as tolerated daily.  Monitor BP at home-Call if greater than 130/80.  Check CMP/CBC  RBBB  Stable on recent EKG, no concerning sx, monitor   Type 2 diabetes mellitus with stage 3 chronic kidney disease, without long-term current use of insulin (HCC) Continue Metformin, Jardiance Education: Reviewed 'ABCs' of diabetes management  Discussed goals to be met and/or maintained include A1C (<7) Blood pressure (<130/80) Cholesterol (LDL <70) Continue Eye Exam yearly  Continue Dental Exam Q6 mo Discussed dietary recommendations Discussed Physical Activity recommendations Check A1C  Hyperlipidemia associated with T2DM (HCC) Discussed lifestyle modifications. Recommended diet heavy in fruits and veggies, omega 3's. Decrease consumption of animal meats, cheeses, and dairy products. Remain active and exercise as tolerated. Continue to monitor. Check lipids/TSH   CKD IIIa associated with T2DM (HCC) Discussed how what you eat and drink can aide in kidney protection. Stay well hydrated. Avoid high salt foods. Avoid NSAIDS. Keep BP and BG well controlled.   Take medications as prescribed. Remain active and exercise as tolerated daily. Maintain weight.  Continue to monitor. Check CMP/GFR/Microablumin  Hypothyroidism, unspecified type Controlled. Continue Levothyroxine. Reminded to take on an empty stomach 30-82mins before food.  Stop any Biotin Supplement 48-72 hours before next TSH level to reduce the risk of falsely low TSH levels. Continue to  monitor.    Cervical radiculopathy at C6 s/p fusion, takes tramadol PRN, mainly for travel 20 tabs lasted over 12 months per PDMP review  Vitamin D deficiency Continue supplementation Check vitamin D level  Insomnia Continue Trazadone  Discussed good sleep hygiene. Establish bed and wake times. Sleep restriction-only sleep estimated hrs sleep. Bed only for sex and sleep, only sleep when sleepy, out of bed if anxious (stimulus control). Reviewed relaxation techniques, mindful meditations. Expected sleep duration. Addressed worries about not sleeping.    Medication management All medications discussed and reviewed in full. All questions and concerns regarding medications addressed.    Osteopenia DEXA Due - ordered Pursue a combination of weight-bearing exercises and strength training. Advised on fall prevention measures including proper lighting in all rooms, removal of area rugs and floor clutter, use of walking devices as deemed appropriate, avoidance of uneven walking surfaces. Smoking cessation and moderate alcohol consumption if applicable Consume 800 to 1000 IU of vitamin D daily with a goal vitamin D serum value of 30 ng/mL or higher. Aim for 1000 to 1200 mg of elemental calcium daily through supplements and/or dietary sources.   Right hand arthritis  Continue tylenol, voltaren or aspercreme 3-4 times daily Plan hand specialist referral if worsening   B12 def Continue B12 supplement Defer recheck to next visit   BMi 20 Discussed appropriate BMI Diet modification. Physical activity. Encouraged/praised to build confidence.   Screening for breast cancer Mammogram ordered  Screening for colon cancer Cologuard due - ordered 03/2022 Encouraged to complete   Orders Placed This Encounter  Procedures   MM Digital Screening    Standing Status:   Future    Standing Expiration Date:   08/18/2023    Order Specific Question:   Reason for Exam (SYMPTOM  OR DIAGNOSIS  REQUIRED)    Answer:   Screening for breast cancer    Order Specific Question:   Preferred imaging location?    Answer:   [facility]   DG Bone Density    Standing Status:   Future    Standing Expiration Date:   08/18/2023    Order Specific Question:   Reason for Exam (SYMPTOM  OR DIAGNOSIS REQUIRED)    Answer:   Osteopenia    Order Specific Question:   Preferred imaging location?    Answer:   [facility]   CBC with Differential/Platelet   COMPLETE METABOLIC PANEL WITH GFR   Lipid panel   TSH   Hemoglobin A1c   VITAMIN D 25 Hydroxy (Vit-D Deficiency, Fractures)    Notify office for further evaluation and treatment, questions or concerns if any reported s/s fail to improve.   The patient was advised to call back or seek an in-person evaluation if any symptoms worsen or if the condition fails to improve as anticipated.   Further disposition pending results of labs. Discussed med's effects and SE's.    I discussed the assessment and treatment plan with the patient. The patient was provided an opportunity to ask questions and all were answered. The patient agreed with the plan and demonstrated an understanding of the instructions.  Discussed med's effects and SE's. Screening labs and tests as requested with regular follow-up as recommended.  I provided 35 minutes of face-to-face time during this encounter including counseling, chart review, and critical decision making was preformed.  Today's Plan of Care is based on a patient-centered health care approach known as shared decision making - the decisions, tests and treatments allow for patient preferences and values to be balanced with clinical evidence.    Future Appointments  Date Time Provider Department Center  05/17/2023  2:00 PM Adela Glimpse, NP [facility] None     Plan:   During the course of the visit the patient was educated and counseled about appropriate screening and preventive services including:    Pneumococcal vaccine  Prevnar 13 Influenza vaccine Td vaccine Screening electrocardiogram Bone densitometry screening Colorectal cancer screening Diabetes screening Glaucoma screening Nutrition counseling  Advanced directives: requested    Subjective:  Elizabeth May is a 77 y.o. female who presents for AWV and 3 month follow up. She has Essential hypertension; Cervical radiculopathy at C6; Type 2 diabetes mellitus (HCC); Hypothyroidism; Hyperlipidemia associated with type 2 diabetes mellitus (HCC); Vitamin D deficiency; Medication management; CKD stage 3 due to type 2 diabetes mellitus (HCC); BMI 20.0-20.9, adult; Insomnia; RBBB; Arthritis of right hand; Arthritis of left hand; B12 deficiency; and Osteopenia on their problem list.   Overall she reports doing well today.  She has no new or additional concerns at this time.  She is married, has 1 adopted daughter, first grandchild is now 69 months old, Vincenza Hews.  She is taking trazodone for insomnia occasionally if needed, takes when traveling.   She has hx of cervical fusion for radiculopathy at C6; she takes tramadol PRN rarely for pain while on long trips. Last filled 20 tabs 05/14/2020.   She is following with urology Dr. Vanna Scotland for OAB, currently on myrbetriq 50 mg daily with benefit.    BMI is Body mass index is 20.05 kg/m., she has been working on diet and exercise Wt Readings from Last 3 Encounters:  08/18/22 115 lb (52.2 kg)  05/14/22 116 lb 3.2 oz (52.7 kg)  12/24/21 115 lb 3.2 oz (  52.3 kg)   She has had elevated blood pressure for years. Her blood pressure has been controlled at home, today their BP is BP: (!) 140/74 She does workout. She denies chest pain, shortness of breath, dizziness.    She has diabetes type 2  with CKD IIIa - jardiance, ACEi With hyperlipidemia on lipitor 40 mg not at goal less than 70  with metformin 2000 mg daily, glipizide 10 mg TID and denies foot ulcerations, hyperglycemia,  hypoglycemia , increased appetite, nausea, paresthesia of the feet, polydipsia, polyuria, visual disturbances, vomiting and weight loss.  She reports fasting ranges 120-130 or so but admits not checking daily  Last A1C in the office was:  Lab Results  Component Value Date   HGBA1C 9.2 (H) 05/14/2022   Lab Results  Component Value Date   CHOL 203 (H) 05/14/2022   HDL 71 05/14/2022   LDLCALC 106 (H) 05/14/2022   TRIG 147 05/14/2022   CHOLHDL 2.9 05/14/2022   She has CKD III associated with T2DM monitored at this office:  Lab Results  Component Value Date   EGFR 57 (L) 05/14/2022   Patient is on Vitamin D supplement, takes 200 mg BID with calcium.    Lab Results  Component Value Date   VD25OH 66 05/14/2022     She is on thyroid medication. Her medication was not changed last visit. Taking 50 mcg daily.  Lab Results  Component Value Date   TSH 3.18 05/14/2022   She is not currenlty on B12 supplement Lab Results  Component Value Date   VITAMINB12 368 05/14/2020      Medication Review:  Current Outpatient Medications (Endocrine & Metabolic):    empagliflozin (JARDIANCE) 25 MG TABS tablet, START 1/2 TAB DAILY FOR 1 MONTH THEN INCREASE TO 1 TAB DAILY. (Patient taking differently: Take one tablet daily)   glipiZIDE (GLUCOTROL) 10 MG tablet, TAKE 1/2-1 TABLET BY MOUTH 2  TIMES DAILY WITH MEALS. Skip this medication if low sugars, throwing up or not eating.   levothyroxine (SYNTHROID) 50 MCG tablet, TAKE 1 TABLET DAILY ON AN EMPTY STOMACH WITH ONLY WATER FOR 30 MINUTES   metFORMIN (GLUCOPHAGE-XR) 500 MG 24 hr tablet, Take 2 tablets 2 x /day with Meals for Diabetes  Current Outpatient Medications (Cardiovascular):    amLODipine (NORVASC) 5 MG tablet, TAKE 1 TABLET EVERY DAY FOR BLOOD PRESSURE   atorvastatin (LIPITOR) 40 MG tablet, Take 1 tablet Daily for Cholesterol   lisinopril-hydrochlorothiazide (ZESTORETIC) 10-12.5 MG tablet, Take 1 tablet by mouth daily.     Current  Outpatient Medications (Other):    Biotin 10 MG TABS, Take by mouth daily.   Calcium Carbonate-Vitamin D (CALCIUM-VITAMIN D) 500-200 MG-UNIT per tablet, Take 1 tablet by mouth 2 (two) times daily with a meal.   CINNAMON PO, Take 1,000 Units by mouth daily. 2 capsules daily   mirabegron ER (MYRBETRIQ) 50 MG TB24 tablet, Take 1 tablet (50 mg total) by mouth daily.   traZODone (DESYREL) 150 MG tablet, Take 1 tablet (150 mg total) by mouth at bedtime. Take 1/2 to 1 tablet at bedtime as needed for sleep   triamcinolone cream (KENALOG) 0.5 %, Apply 1 Application topically 2 (two) times daily.   Magnesium 250 MG TABS, Take 250 mg by mouth daily. (Patient not taking: Reported on 08/18/2022)  Current Problems (verified) Patient Active Problem List   Diagnosis Date Noted   Osteopenia 11/04/2020   B12 deficiency 09/16/2020   Arthritis of left hand 05/16/2020   Arthritis of right  hand 05/14/2020   RBBB 02/08/2018   Insomnia 10/27/2017   CKD stage 3 due to type 2 diabetes mellitus (HCC) 06/27/2017   BMI 20.0-20.9, adult 06/27/2017   Medication management 03/08/2014   Hyperlipidemia associated with type 2 diabetes mellitus (HCC) 03/21/2013   Vitamin D deficiency 03/21/2013   Type 2 diabetes mellitus (HCC) 03/17/2013   Hypothyroidism    Cervical radiculopathy at C6 10/22/2011   Essential hypertension 07/01/2009    Screening Tests Immunization History  Administered Date(s) Administered   Influenza Split 02/13/2013   Influenza, High Dose Seasonal PF 12/07/2014, 12/09/2015, 12/15/2016, 02/08/2018, 12/16/2018, 02/20/2021, 12/03/2021   Influenza-Unspecified 03/26/2014   PFIZER(Purple Top)SARS-COV-2 Vaccination 04/20/2019, 05/18/2019, 12/25/2019, 09/21/2020   Pneumococcal Conjugate-13 12/09/2015   Pneumococcal Polysaccharide-23 05/14/2021   Pneumococcal-Unspecified 03/23/2008   Tdap 07/17/2010, 12/03/2021   Zoster Recombinat (Shingrix) 12/16/2018, 04/06/2019   Zoster, Live 01/14/2012   Health  Maintenance  Topic Date Due   COVID-19 Vaccine (5 - 2023-24 season) 11/21/2021   FOOT EXAM  05/14/2022   OPHTHALMOLOGY EXAM  05/30/2022   Fecal DNA (Cologuard)  09/19/2022   INFLUENZA VACCINE  10/22/2022   DEXA SCAN  11/01/2022   HEMOGLOBIN A1C  11/12/2022   Diabetic kidney evaluation - eGFR measurement  05/15/2023   Diabetic kidney evaluation - Urine ACR  05/15/2023   Medicare Annual Wellness (AWV)  08/18/2023   DTaP/Tdap/Td (3 - Td or Tdap) 12/04/2031   Pneumonia Vaccine 53+ Years old  Completed   Hepatitis C Screening  Completed   Zoster Vaccines- Shingrix  Completed   HPV VACCINES  Aged Out   Shingrix: 2/2 2021 Covid 19: requested booster date  Last colonoscopy: 2006 cologuard 08/2019 - Due  Last mammogram: 11/2020 - due  DEXA 11/02/2020 Spine T -1.9 due   Names of Other Physician/Practitioners you currently use: 1. [facility] here for primary care 2. Dr. Rae Mar at [facility], eye doctor, last visit 05/29/2021, no retinopathy, + new cataract, monitoring  3. [facility], dentist, last visit 2023, goes q79m, has partials 4. Dr. Sharyn Lull, Dermatologist, last visit 10/2021, goes PRN  Patient Care Team: [name], MD as PCP - General (Internal Medicine) Kathleene Hazel, MD as Consulting Physician (Cardiology) Charna Elizabeth, MD as Consulting Physician (Gastroenterology) Estill Bamberg, MD as Consulting Physician (Orthopedic Surgery) Freddy Finner, MD (Inactive) as Consulting Physician (Obstetrics and Gynecology)  Allergies No Known Allergies  SURGICAL HISTORY She  has a past surgical history that includes Breast surgery (80's); Anterior cervical decomp/discectomy fusion (10/22/2011); and Breast excisional biopsy (Left). FAMILY HISTORY Her family history includes Alzheimer's disease in her father, paternal aunt, and paternal uncle; Diabetes in her maternal aunt, maternal grandmother, and maternal uncle; Drug abuse  in her maternal grandmother; Heart disease in her mother; Hyperlipidemia in her mother; Hypertension in her mother. SOCIAL HISTORY She  reports that she has never smoked. She has never used smokeless tobacco. She reports current alcohol use of about 7.0 standard drinks of alcohol per week. She reports that she does not use drugs.  MEDICARE WELLNESS OBJECTIVES: Physical activity: Current Exercise Habits: Home exercise routine Cardiac risk factors:   Depression/mood screen:      08/18/2022   10:39 AM  Depression screen PHQ 2/9  Decreased Interest 0  Down, Depressed, Hopeless 0  PHQ - 2 Score 0    ADLs:     08/18/2022   10:23 AM 09/16/2021   10:52 AM  In your present state of health, do you have  any difficulty performing the following activities:  Hearing? 0 0  Vision? 0 0  Comment completed bilateral cataract surgery   Difficulty concentrating or making decisions? 0 0  Walking or climbing stairs? 0 0  Dressing or bathing? 0 0  Doing errands, shopping? 0 0     Cognitive Testing  Alert? Yes  Normal Appearance?Yes  Oriented to person? Yes  Place? Yes   Time? Yes  Recall of three objects?  Yes  Can perform simple calculations? Yes  Displays appropriate judgment?Yes  Can read the correct time from a watch face?Yes  EOL planning:       Review of Systems  Constitutional:  Negative for chills, fever, malaise/fatigue and weight loss.  HENT:  Negative for congestion, ear pain, hearing loss, nosebleeds, sore throat and tinnitus.   Eyes:  Negative for blurred vision and double vision.  Respiratory:  Negative for cough, shortness of breath and wheezing.   Cardiovascular:  Negative for chest pain, palpitations, orthopnea, claudication and leg swelling.  Gastrointestinal:  Negative for abdominal pain, blood in stool, constipation, diarrhea, heartburn, melena, nausea and vomiting.  Genitourinary: Negative.        Mild stress incontinence  Musculoskeletal:  Positive for neck pain  (intermittent). Negative for joint pain and myalgias.  Skin:  Positive for itching (itchy lesions to legs). Negative for rash.  Neurological:  Negative for dizziness, tingling, sensory change, loss of consciousness, weakness and headaches.  Endo/Heme/Allergies:  Negative for polydipsia.  Psychiatric/Behavioral:  Negative for depression, memory loss and substance abuse. The patient is not nervous/anxious and does not have insomnia.   All other systems reviewed and are negative.    Objective:     Today's Vitals   08/18/22 1033  BP: (!) 140/74  Pulse: 66  Temp: 97.9 F (36.6 C)  SpO2: 98%  Weight: 115 lb (52.2 kg)    Body mass index is 20.05 kg/m.  General appearance: alert, no distress, WD/WN, female HEENT: normocephalic, sclerae anicteric, TMs pearly, nares patent, no discharge or erythema, pharynx normal Oral cavity: Poor dentition; left lower jaw with teeth absent, erythematous gingiva with ? Scant purulent discharge; front teeth cracked/chipped; otherwise MM intact, uvula midline, sublingual glands non-inflamed Neck: supple, no lymphadenopathy, no thyromegaly, no masses Heart: RRR, normal S1, subtle spit S2, no murmurs Lungs: CTA bilaterally, no wheezes, rhonchi, or rales Abdomen: +bs, soft, non tender, non distended, no masses, no hepatomegaly, no splenomegaly Musculoskeletal: nontender, no swelling, no obvious deformity, symmetrical strength Extremities: no edema, no cyanosis, no clubbing Pulses: 2+ symmetric, upper and lower extremities, normal cap refill Neurological: alert, oriented x 3, CN2-12 intact, strength normal upper extremities and lower extremities, sensation normal throughout, DTRs 2+ throughout, no cerebellar signs, gait normal. Sensation intact to monofilament bil feet.  Psychiatric: normal affect, behavior normal, pleasant  Skin: Warm/dry, intact, without, ecchymosis, rash; she has scattered lesions with central excoriation/scabbing, very faint surrounding  erythema to bil lower legs.   EKG: Mild sinus brady, RBBB, QT 440 ms, WNL  Medicare Attestation I have personally reviewed: The patient's medical and social history Their use of alcohol, tobacco or illicit drugs Their current medications and supplements The patient's functional ability including ADLs,fall risks, home safety risks, cognitive, and hearing and visual impairment Diet and physical activities Evidence for depression or mood disorders  The patient's weight, height, BMI, and visual acuity have been recorded in the chart.  I have made referrals, counseling, and provided education to the patient based on review of the above and I  have provided the patient with a written personalized care plan for preventive services.     Adela Glimpse, NP   08/18/2022

## 2022-08-18 NOTE — Patient Instructions (Signed)

## 2022-08-19 LAB — COMPLETE METABOLIC PANEL WITH GFR
AG Ratio: 1.9 (calc) (ref 1.0–2.5)
ALT: 17 U/L (ref 6–29)
AST: 17 U/L (ref 10–35)
Albumin: 4.4 g/dL (ref 3.6–5.1)
Alkaline phosphatase (APISO): 72 U/L (ref 37–153)
BUN/Creatinine Ratio: 21 (calc) (ref 6–22)
BUN: 32 mg/dL — ABNORMAL HIGH (ref 7–25)
CO2: 27 mmol/L (ref 20–32)
Calcium: 9.3 mg/dL (ref 8.6–10.4)
Chloride: 100 mmol/L (ref 98–110)
Creat: 1.54 mg/dL — ABNORMAL HIGH (ref 0.60–1.00)
Globulin: 2.3 g/dL (calc) (ref 1.9–3.7)
Glucose, Bld: 377 mg/dL — ABNORMAL HIGH (ref 65–99)
Potassium: 4.7 mmol/L (ref 3.5–5.3)
Sodium: 137 mmol/L (ref 135–146)
Total Bilirubin: 0.5 mg/dL (ref 0.2–1.2)
Total Protein: 6.7 g/dL (ref 6.1–8.1)
eGFR: 35 mL/min/{1.73_m2} — ABNORMAL LOW (ref >=60)

## 2022-08-19 LAB — LIPID PANEL
Cholesterol: 169 mg/dL (ref <200)
HDL: 77 mg/dL (ref >=50)
LDL Cholesterol (Calc): 73 mg/dL (calc)
Non-HDL Cholesterol (Calc): 92 mg/dL (calc) (ref <130)
Total CHOL/HDL Ratio: 2.2 (calc) (ref <5.0)
Triglycerides: 108 mg/dL (ref <150)

## 2022-08-19 LAB — HEMOGLOBIN A1C
Hgb A1c MFr Bld: 8.5 % of total Hgb — ABNORMAL HIGH (ref <5.7)
Mean Plasma Glucose: 197 mg/dL
eAG (mmol/L): 10.9 mmol/L

## 2022-08-19 LAB — CBC WITH DIFFERENTIAL/PLATELET
Absolute Monocytes: 369 cells/uL (ref 200–950)
Basophils Absolute: 78 cells/uL (ref 0–200)
HCT: 45.2 % — ABNORMAL HIGH (ref 35.0–45.0)
Lymphs Abs: 1441 cells/uL (ref 850–3900)
MCH: 29.1 pg (ref 27.0–33.0)
MCHC: 32.7 g/dL (ref 32.0–36.0)
Monocytes Relative: 5.2 %
Neutro Abs: 5084 cells/uL (ref 1500–7800)
Neutrophils Relative %: 71.6 %
RDW: 13 % (ref 11.0–15.0)
WBC: 7.1 10*3/uL (ref 3.8–10.8)

## 2022-08-19 LAB — TSH: TSH: 2.66 mIU/L (ref 0.40–4.50)

## 2022-08-19 LAB — VITAMIN D 25 HYDROXY (VIT D DEFICIENCY, FRACTURES): Vit D, 25-Hydroxy: 60 ng/mL (ref 30–100)

## 2022-08-21 ENCOUNTER — Encounter: Payer: Self-pay | Admitting: Nurse Practitioner

## 2022-08-24 ENCOUNTER — Other Ambulatory Visit: Payer: Self-pay | Admitting: Nurse Practitioner

## 2022-08-24 DIAGNOSIS — N183 Chronic kidney disease, stage 3 unspecified: Secondary | ICD-10-CM

## 2022-08-27 ENCOUNTER — Other Ambulatory Visit: Payer: Self-pay | Admitting: Nurse Practitioner

## 2022-08-27 DIAGNOSIS — N183 Chronic kidney disease, stage 3 unspecified: Secondary | ICD-10-CM

## 2022-09-03 ENCOUNTER — Ambulatory Visit (INDEPENDENT_AMBULATORY_CARE_PROVIDER_SITE_OTHER): Payer: Medicare PPO | Admitting: Family Medicine

## 2022-09-03 ENCOUNTER — Other Ambulatory Visit: Payer: Medicare PPO

## 2022-09-03 VITALS — Ht 63.5 in | Wt 111.0 lb

## 2022-09-03 DIAGNOSIS — E039 Hypothyroidism, unspecified: Secondary | ICD-10-CM | POA: Diagnosis not present

## 2022-09-03 DIAGNOSIS — N183 Chronic kidney disease, stage 3 unspecified: Secondary | ICD-10-CM

## 2022-09-03 DIAGNOSIS — E1122 Type 2 diabetes mellitus with diabetic chronic kidney disease: Secondary | ICD-10-CM | POA: Diagnosis not present

## 2022-09-03 DIAGNOSIS — E875 Hyperkalemia: Secondary | ICD-10-CM

## 2022-09-03 DIAGNOSIS — M8580 Other specified disorders of bone density and structure, unspecified site: Secondary | ICD-10-CM | POA: Diagnosis not present

## 2022-09-03 DIAGNOSIS — E559 Vitamin D deficiency, unspecified: Secondary | ICD-10-CM

## 2022-09-03 DIAGNOSIS — E119 Type 2 diabetes mellitus without complications: Secondary | ICD-10-CM | POA: Diagnosis not present

## 2022-09-03 DIAGNOSIS — G4700 Insomnia, unspecified: Secondary | ICD-10-CM

## 2022-09-03 NOTE — Patient Instructions (Signed)
   Dairy milk is NOT high-glycemic despite it having 12 grams of sugar per cup.  The only nondairy milk that has a meaningful amount of protein is soy milk.  Both soy milk (if shaken very well) and cow's milk are excellent sources of calcium.  You might also want to experiment with different yogurts (great calcium source) to find one that works for you.  If you want to get less sugar than in most yogurts, try mixing plain with sweetened yogurt.   TASTE PREFERENCES ARE LEARNED.  This means it will get easier to choose foods you know are good for you if you are exposed to them enough.    Diet Recommendations for Diabetes  Carbohydrate includes starch, sugar, and fiber.  Of these, only sugar and starch raise blood glucose.  (Fiber is found in fruits, vegetables [especially skin, seeds, and stalks], whole grains, and beans.)   Starchy (carb) foods: Bread, rice, pasta, potatoes, corn, cereal, grits, crackers, bagels, muffins, all baked goods.  (Fruit, milk, and yogurt also have carbohydrate, but most of these foods will not spike your blood sugar as most starchy or sweet foods will.)  A few fruits do cause high blood sugars; use small portions of bananas (limit to 1/2 at a time), grapes, watermelon, and oranges.   Protein foods: Meat, fish, poultry, eggs, dairy foods, and beans such as pinto and kidney beans (beans also provide carbohydrate).   1. Eat at least 3 REAL meals and 1-2 snacks per day.  Eat breakfast within one hour of getting up.  Have something to eat at least every 5 hours while awake.  - A REAL breakfast needs to include both starch and protein foods.   - A REAL meal for lunch or dinner includes at least some protein, some starch, and vegetables and/or fruit.     2. Limit starchy foods to TWO portions per meal and ONE per snack. ONE portion of a starchy food is equal to the following:  - ONE slice of bread (or its equivalent, such as half of a hamburger bun).  - 1/2 cup of a "scoopable"  starchy food such as potatoes or rice.  - 15 grams of Total Carbohydrate as shown on food label.  - 4 ounces of a sweet drink (including fruit juice).  Document progress on the above goals using the Goals Sheet provided today.    Consider making an appt with Paulino Rily, PharmD to enroll in a study in which you would start using a continuous glucose monitor (CGM):  [phone].

## 2022-09-03 NOTE — Progress Notes (Signed)
Medical Nutrition Therapy Appt start time: 1000 end time: 1100 (1 hour) Primary concerns today: Blood sugar control and CKD .  PCP Elizabeth Cowboy, MD / Elizabeth Glimpse, NP Husband Elizabeth May  Relevant history/background: Elizabeth May was referred by Elizabeth Glimpse, NP for MNT related to DM.  Other diagnoses include CKD stage 3, osteopenia, hypothyroidism, HTN, HLD, Vit D defic, and insomnia.  (Vit B12 defic remains on problem list, but measures going back to 2016 indicate normal range.)  Despite efforts at a healthy diet, A1c on 5/28 was 8.5%, down from 9.2 in Feb, but up from the 8.3 in Oct 2023.  Last DXA scan [facility]) was Aug 2022: Spine T-score -1.9; LFN T-score -1.7.  Lab's on 08/18/22: 25-OH vit D: 60 ng/mL.   Assessment:  Elizabeth May was accompanied by her husband Elizabeth May at today's visit.  She was diagnosed with DM many years ago, and A1c has been >7% since 2018 and >8% since 2020.  Not clear if ever referred for MNT or DSMT.  She is potentially interested in CGM as discussed today, and will consider scheduling with Dr. Raymondo May for this.   Did not talk about osteoporosis yet; spent >90 minutes addressing just BG management, but I would like to at next appt encourage her to consume more Ca sources, and emphasize importance of adequate protein and energy to bone health.    Learning Readiness: Ready  FBG: 130s (?) Usual eating pattern: 3 meals and 0-3 snacks per day. Frequent foods and beverages: water, 1-2 c coffee with 2 pkts Splenda & 1-2 tbsp cream, flavored water, diet swt tea; chx, chx salad, lean hambgr, crackers, chs, fruit, veg's, cottage cheese.   Avoided foods: dairy milk (b/c too much sugar), yogurt.   Usual physical activity: walks (TM/outside) 30-45 min ~4 X wk. Sleep: Estimates 7-8 hours/night.  Naps most days 5-6 PM.   Food security -  Within the past 12 months:    - Did you run out of food, and not have money to get more?   No.     - Did you worry that your food would run out before you  could afford to buy more?   No.   24-hr recall (imprecise): (Up at 7 AM) B (7 AM)-   2 c coffee each with 2 pkts Splenda & 1-2 tbsp cream Snk (10 AM)-   2 scrambled eggs (+/- baked apple with sugar-free syrup, cinnamon, and margarine?)  - 1 PM gym workout (45 min on TM; drank 12 oz orange drink) -  L ( PM)-  --- Snk ( PM)-  Diet drink D (7 PM)-  4-5 oz lightly breaded cod, 1 c lima beans, ?other veg?, diet drink Snk ( PM)-  --- Typical day? No.   Eats fish only once a week usually, often eats cheese and crackers for dinner.    Nutritional Diagnosis:  NB-1.1 Food and nutrition-related knowledge deficit As related to BG management and nutrition needs.  As evidenced by diet history suggestive of inadequate intake of protein, energy, and multiple micro-nutrients.  Handouts given during visit include: After-Visit Summary (AVS) Goals Sheet  Demonstrated degree of understanding via:  Teach Back  Barriers to learning/adherence to lifestyle change: Longstanding eating behaviors as well as poor appetite.   For behavioral goals and recommendations, see Patient Instructions.    Monitoring/Evaluation:  Dietary intake, exercise, FBG, and body weight in 5 week(s).

## 2022-09-04 LAB — COMPLETE METABOLIC PANEL WITH GFR
AG Ratio: 1.8 (calc) (ref 1.0–2.5)
ALT: 16 U/L (ref 6–29)
AST: 18 U/L (ref 10–35)
Albumin: 4 g/dL (ref 3.6–5.1)
Alkaline phosphatase (APISO): 61 U/L (ref 37–153)
BUN/Creatinine Ratio: 21 (calc) (ref 6–22)
BUN: 27 mg/dL — ABNORMAL HIGH (ref 7–25)
CO2: 27 mmol/L (ref 20–32)
Calcium: 9.1 mg/dL (ref 8.6–10.4)
Chloride: 101 mmol/L (ref 98–110)
Creat: 1.29 mg/dL — ABNORMAL HIGH (ref 0.60–1.00)
Globulin: 2.2 g/dL (calc) (ref 1.9–3.7)
Glucose, Bld: 151 mg/dL — ABNORMAL HIGH (ref 65–99)
Potassium: 4.4 mmol/L (ref 3.5–5.3)
Sodium: 138 mmol/L (ref 135–146)
Total Bilirubin: 0.4 mg/dL (ref 0.2–1.2)
Total Protein: 6.2 g/dL (ref 6.1–8.1)
eGFR: 43 mL/min/{1.73_m2} — ABNORMAL LOW (ref >=60)

## 2022-10-05 ENCOUNTER — Ambulatory Visit: Payer: Medicare PPO | Admitting: Family Medicine

## 2022-10-05 ENCOUNTER — Encounter: Payer: Self-pay | Admitting: Family Medicine

## 2022-10-05 VITALS — Ht 63.5 in | Wt 108.6 lb

## 2022-10-05 DIAGNOSIS — E119 Type 2 diabetes mellitus without complications: Secondary | ICD-10-CM | POA: Diagnosis not present

## 2022-10-05 NOTE — Progress Notes (Signed)
Medical Nutrition Therapy Appt start time: 1000 end time: 1100 (1 hour) Primary concerns today: Blood sugar control and CKD .  PCP Elizabeth Cowboy, MD / Elizabeth Glimpse, NP Husband Elizabeth May  Relevant history/background: Elizabeth May was referred by Elizabeth Glimpse, NP for MNT related to DM.  Other diagnoses include CKD stage 3, osteopenia, hypothyroidism, HTN, HLD, Vit D defic, and insomnia.  (Vit B12 defic remains on problem list, but measures going back to 2016 indicate normal range.)  Despite efforts at a healthy diet, A1c on 5/28 was 8.5%, down from 9.2 in Feb, but up from the 8.3 in Oct 2023.  Last DXA scan [facility]) was Aug 2022: Spine T-score -1.9; LFN T-score -1.7.  Lab's on 08/18/22: 25-OH vit D: 60 ng/mL.   Assessment:  Elizabeth May was again accompanied by her husband Elizabeth May at today's visit.  Elizabeth May did most of the documenting on Connie's Goals Sheet, which they did not have with them today.   She has not pursued CGM as discussed at last MNT appt.  Discussed benefits of CGM again today, and Elizabeth May said she would think about it.  We discussed bone health today, including Ca (food and supplement) sources, and emphasized importance of adequate protein and energy for bone health.    Learning Readiness: Ready  FBG: Not checking. (Again encouraged her to check at least 3 X wk.) Weight: 108.6 lb; 111 lb on 09/03/22; ht is 63.5".  Usual eating pattern: 3 meals and 0-1 snack per day. Now eating yogurt ~5 X wk.   Usual physical activity: walks (usually TM at gym) 30-45 min ~4 X wk. Sleep: Estimates 7-8 hours/night.  Still napping most days 5-6 PM.   Food security -  Within the past 12 months, did you run out of or worry that you would run out of food, and not have money to get more?   No.    24-hr recall:  (Up at 7 AM) B (7 AM)-  1 c coffee, 2 pkts Splenda, 1 tbsp cream Snk (8:30)-  5 oz fruit yogurt, 1/2 c blueberries, 1 c coffee, 2 pkts Splenda, 1 tbsp cream L (12:30 PM)-  1 slc toast, 2-3 tbsp peanut butter,  diet soda Snk ( PM)-  --- D (7 PM)-  1 slc meatloaf, 1/2 swt potato, 1/2 tsp margarine, 1/2 c coleslaw, diet soda Snk ( PM)-  1 glass wine  Typical day? Yes.     Nutritional Diagnosis:  NB-1.1 Food and nutrition-related knowledge deficit As related to BG management and nutrition needs.  As evidenced by diet history suggestive of inadequate intake of protein, energy, and multiple micro-nutrients.  Handouts given during visit include: After-Visit Summary (AVS) Goals Sheet 2 handouts on osteoporosis prevention and management Fasting Blood Glucose form  Barriers to learning/adherence to lifestyle change: Longstanding eating behaviors as well as poor appetite.   For behavioral goals and recommendations, see Patient Instructions.    Monitoring/Evaluation:  Dietary intake, exercise, FBG, and body weight in 6 week(s).

## 2022-10-05 NOTE — Patient Instructions (Addendum)
To keep your bones healthy, of course you need calcium and vitamin D, you need to include sufficient protein as enough energy (calories).    Some principles of calcium supplementation:  The calcium supplement that is best absorbed is calcium citrate.  This does not require food for best absorption.   The evidence is still sparse, but it is probably safest to take only a low-dose (<250 mg) of calcium at a time.  This is because there are numerous studies now showing that taking calcium supplements is associated with increased risk of adverse cardiac events.  You can find a low-dose calcium citrate at [facility] on [address].  Call before going to check if they are open.   Avoid taking calcium along with significant food sources of calcium.    Your diet has not been providing adequate calcium.  The recommended amount of calcium for adults 71 years and older is 1200 mg/day.  I strongly encourage you to take a low-dose calcium supplement (<250 mg) at least once daily.  Refer to the handout provided today for information on calcium supplements and food sources.    Start checking your fasting blood glucose at least 3 days a week.    TASTE PREFERENCES ARE LEARNED.  This means it will get easier to choose foods you know are good for you if you are exposed to them enough.    Behavioral Goals Remain the Same:  1. Eat at least 3 REAL meals and 1-2 snacks per day.  Eat breakfast within one hour of getting up.  Have something to eat at least every 5 hours while awake.  - A REAL breakfast needs to include both starch and protein foods.   - A REAL meal for lunch or dinner includes at least some protein, some starch, and vegetables and/or fruit.     2. Limit starchy foods to TWO portions per meal and ONE per snack. ONE portion of a starchy food is equal to the following:  - ONE slice of bread (or its equivalent, such as half of a hamburger bun).  - 1/2 cup of a "scoopable" starchy food such as  potatoes or rice.  - 15 grams of Total Carbohydrate as shown on food label.  - 4 ounces of a sweet drink (including fruit juice).   Document progress on the above goals using the Goals Sheet provided today, and bring to your f/u appt.    I will be in touch regarding your question about the study in which you would start using a continuous glucose monitor (CGM).  Follow-up appt on Monday, August 26 at 11 AM.

## 2022-10-06 ENCOUNTER — Ambulatory Visit
Admission: RE | Admit: 2022-10-06 | Discharge: 2022-10-06 | Disposition: A | Discharge status: Home / Self Care | Payer: Medicare PPO | Source: Ambulatory Visit | Attending: Nurse Practitioner | Admitting: Nurse Practitioner

## 2022-10-06 DIAGNOSIS — Z1231 Encounter for screening mammogram for malignant neoplasm of breast: Secondary | ICD-10-CM

## 2022-10-19 DIAGNOSIS — Z1211 Encounter for screening for malignant neoplasm of colon: Secondary | ICD-10-CM | POA: Diagnosis not present

## 2022-11-17 IMAGING — MG MM DIGITAL DIAGNOSTIC UNILAT*R* W/ TOMO W/ CAD
6 series · 6 of 18 positions shown · non-contrast
Comparison: Previous exam(s).

CLINICAL DATA: 75-year-old female presenting as a recall from
screening for possible right breast asymmetry.

EXAM:
DIGITAL DIAGNOSTIC UNILATERAL RIGHT MAMMOGRAM WITH TOMOSYNTHESIS AND
CAD
TECHNIQUE: Right digital diagnostic mammography and breast tomosynthesis was
performed. The images were evaluated with computer-aided detection.

[R CC synth-2D]
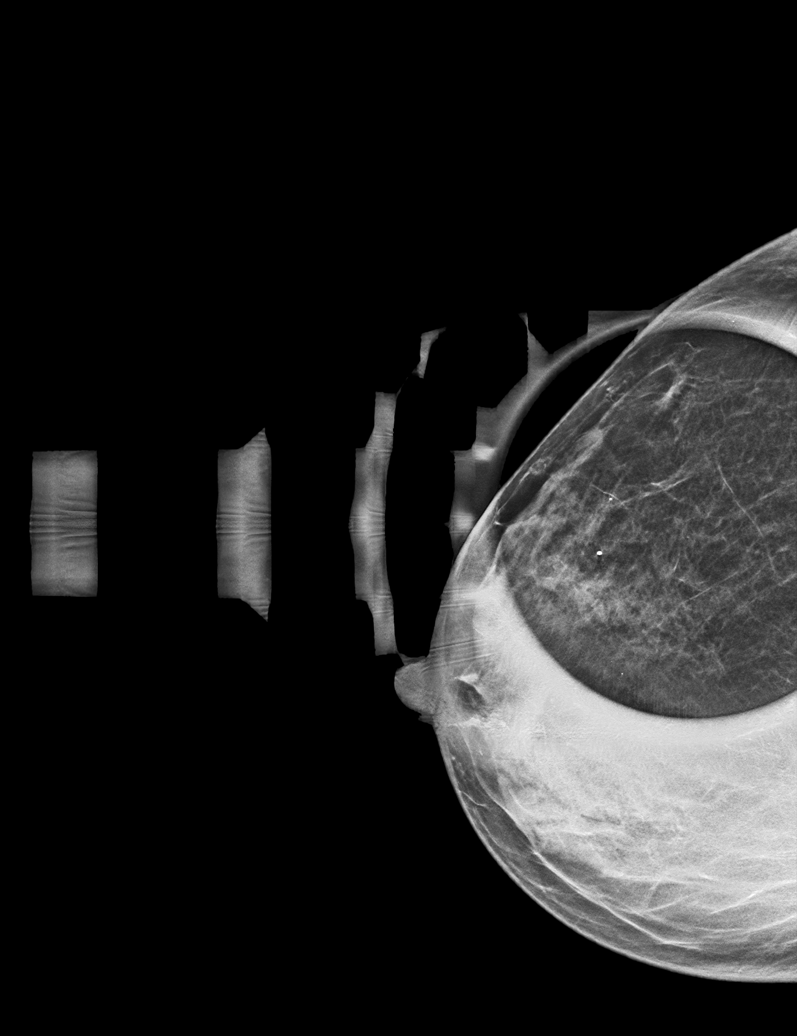

[R MLO synth-2D (1 of 2)]
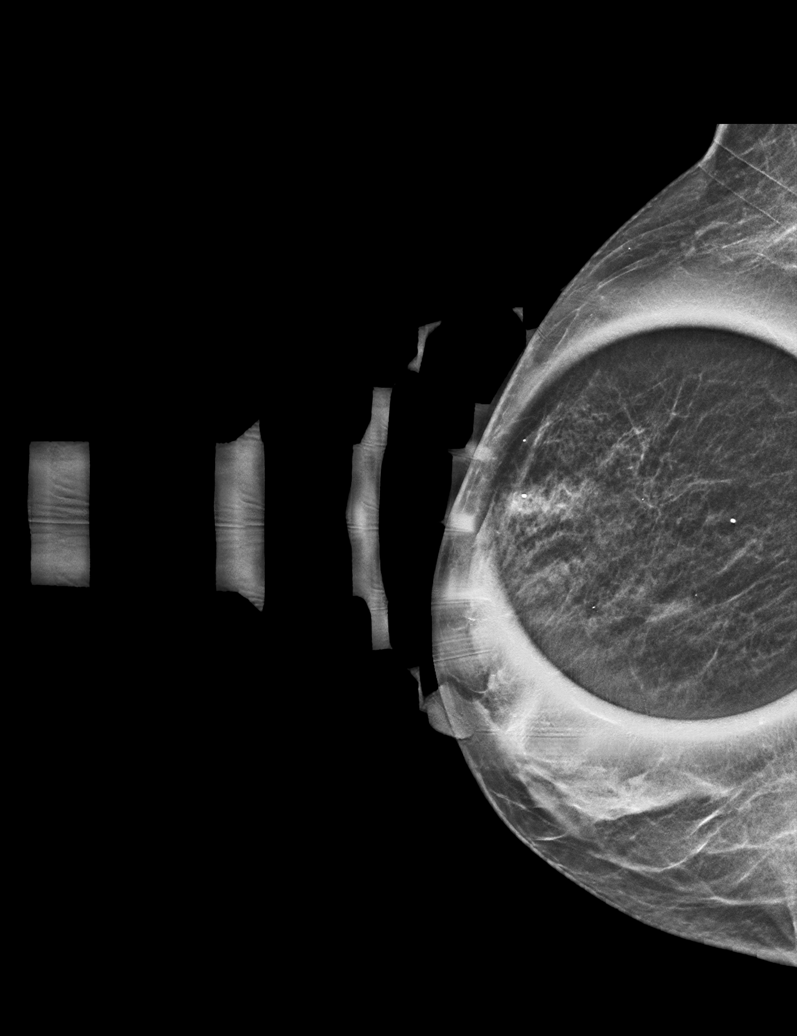

[R MLO synth-2D (2 of 2)]
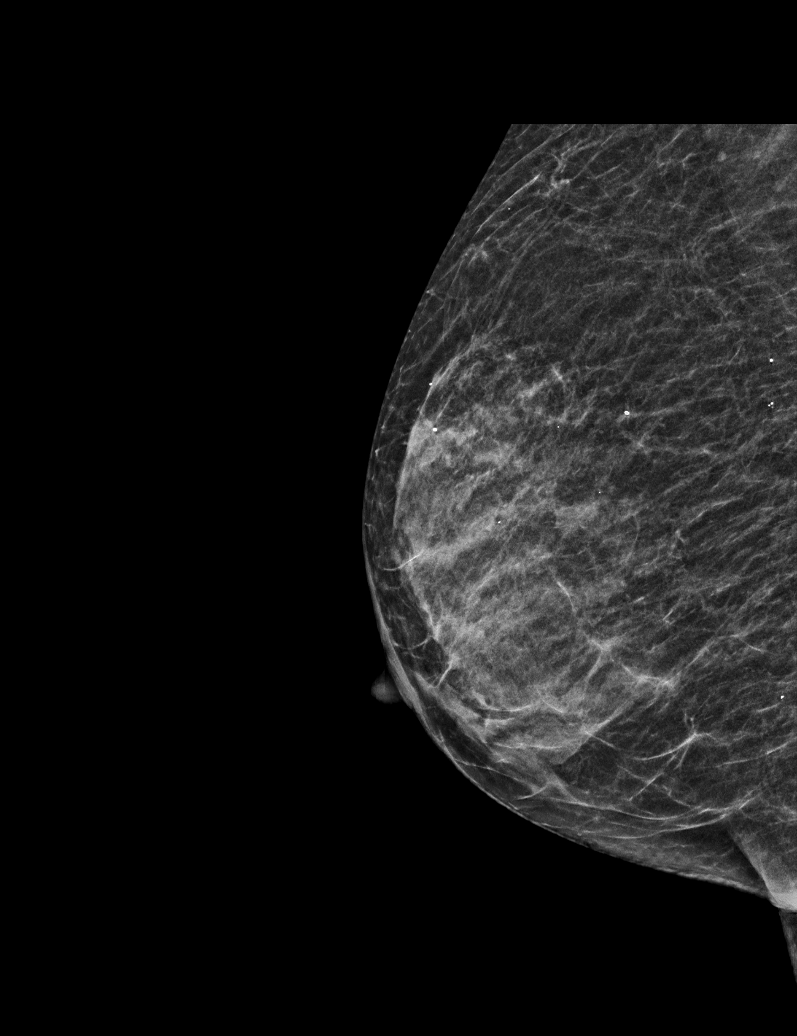

[R CC tomo · tomo slice 18/35.0]
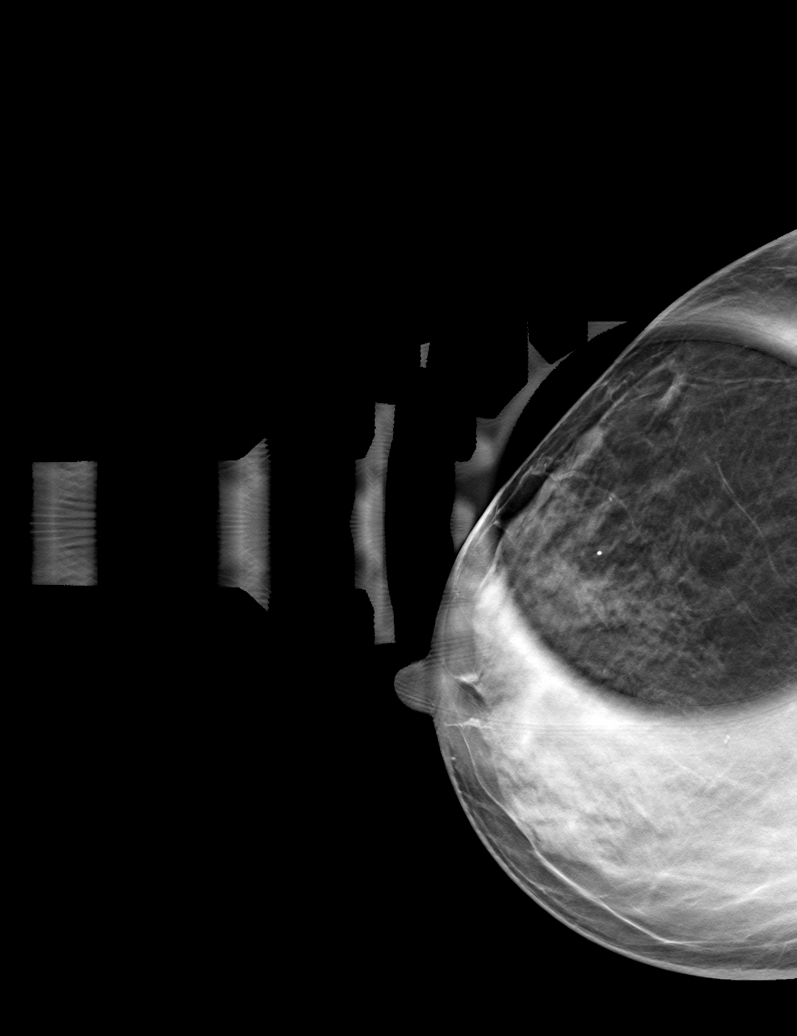

[R MLO tomo (1 of 2) · tomo slice 20/39.0]
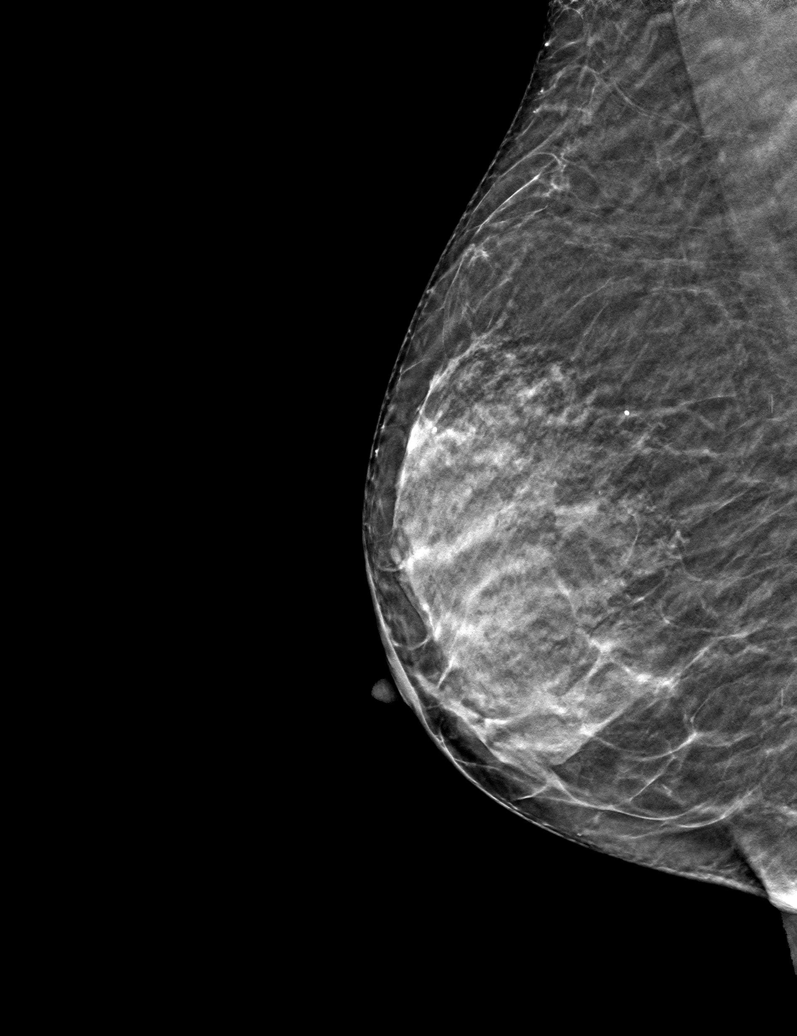

[R MLO tomo (2 of 2) · tomo slice 19/38.0]
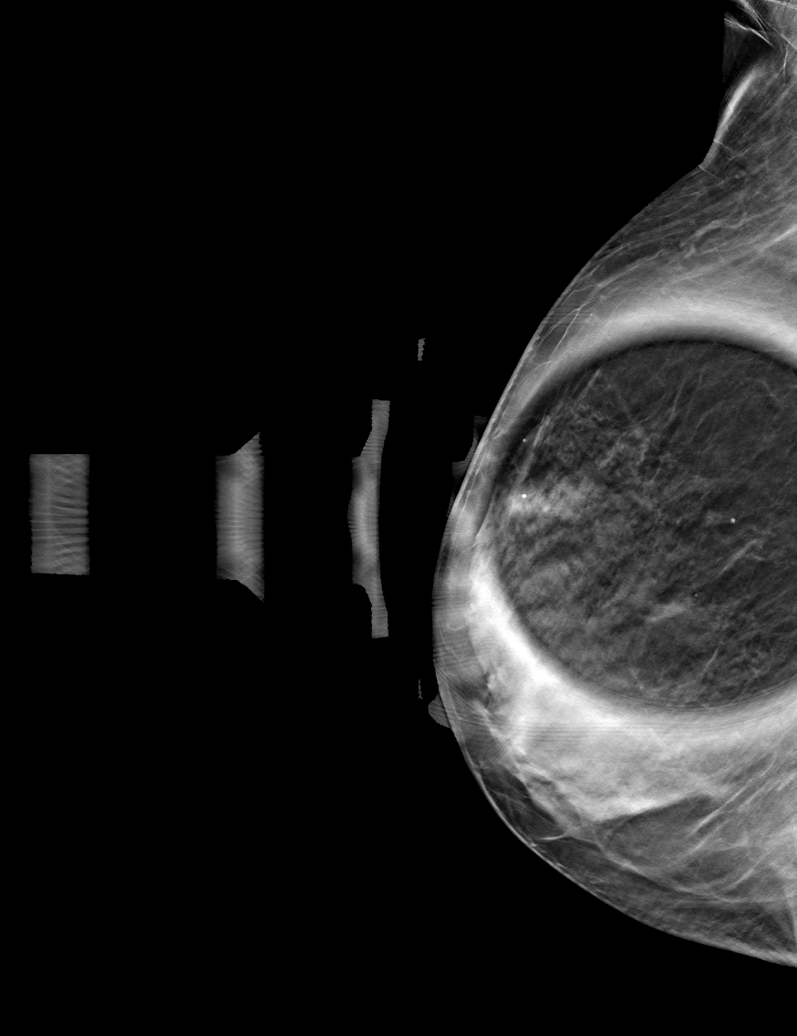

[6 of 18 positions shown; findings below may reference images not displayed]

ACR Breast Density Category c: The breast tissue is heterogeneously
dense, which may obscure small masses.
FINDINGS: Spot compression tomosynthesis and full field mL tomosynthesis views
of the right breast were performed for a questioned asymmetry in the
upper outer right breast. On the additional imaging the tissue in
this area disperses without persistent asymmetry, mass, or
distortion. There are no new findings in the right breast on today's
imaging.
IMPRESSION: No mammographic evidence of malignancy in the right breast.

RECOMMENDATION:
Screening mammogram in one year.(Code:MQ-2-LR5)

I have discussed the findings and recommendations with the patient.
If applicable, a reminder letter will be sent to the patient
regarding the next appointment.

BI-RADS CATEGORY  1: Negative.

## 2022-11-24 ENCOUNTER — Ambulatory Visit (INDEPENDENT_AMBULATORY_CARE_PROVIDER_SITE_OTHER): Payer: Medicare PPO | Admitting: Family Medicine

## 2022-11-24 VITALS — Ht 63.5 in | Wt 113.0 lb

## 2022-11-24 DIAGNOSIS — E119 Type 2 diabetes mellitus without complications: Secondary | ICD-10-CM | POA: Diagnosis not present

## 2022-11-24 NOTE — Progress Notes (Signed)
Medical Nutrition Therapy Appt start time: 1330 end time: 1430 (1 hour) Primary concerns today: Blood sugar control and CKD .  PCP [name], MD / Adela Glimpse, NP Husband Elizabeth May  Relevant history/background: Elizabeth May was referred by Adela Glimpse, NP for MNT related to DM.  Other diagnoses include CKD stage 3, osteopenia, hypothyroidism, HTN, HLD, Vit D defic, and insomnia.  (Vit B12 defic remains on problem list, but measures going back to 2016 indicate normal range.)  Despite efforts at a healthy diet, A1c on 5/28 was 8.5%, down from 9.2 in Feb, but up from the 8.3 in Oct 2023.  Last DXA scan [facility]) was Aug 2022: Spine T-score -1.9; LFN T-score -1.7.  Lab's on 08/18/22: 25-OH vit D: 60 ng/mL.   Assessment:  Elizabeth May did not track progress on her Goals Sheet, but she did document FBG on the Fasting Blood Glucose form provided at last appt: High was 159 on 7/31; low was 118; most were 120s to 130s.  Dietary changes she has made include limiting carb foods to less than 2 carb portions/meal, and she has more consistently been eating 3 real meals.  She has continued taking 50 mg calcium 2 X day, including immediately following yogurt at breakfast.  I reiterated the recommendation to switch to a low-dose Ca supplement, and again encouraged her to read the handout on diet and bone health provided.    Learning Readiness: Ready  FBG:    Mostly 120s to 130s.   Weight:   113.0 lb; 108.6 lb on 10/05/22; ht is 63.5".  Usual eating pattern: 3 meals and 0-1 snack per day. Now eating yogurt ~5 X wk.   Usual physical activity: walks (usually TM at gym) 30-45 min 4(+) X wk. Sleep: Estimates 7-8 hours/night.  Has been going to bed 10 or 11 PM, and up at 6-7:30.    24-hr recall:  (Up at 6:30 AM) B (7:30 AM)-  1 c fruit yogurt, 1/2 c blueberries, 1/3 c granola, 1 c coffee, 2 pkts Splenda, 1 tbsp cream  Snk ( AM)-  --- L (12 PM)-  1 slc toast, 2 oz deli [place], 1 oz cheese, mustard, diet iced tea Snk (  PM)-  Diet iced tea D (7 PM)-  1 c store-bought chx salad, 1 side salad, 1-2 tbsp dressing, tangerine, diet soda Snk (9 PM)-  1/2 c blueberries, 1 glass wine Typical day? Yes.     Nutritional Diagnosis: Progress noted on NB-1.1 Food and nutrition-related knowledge deficit As related to BG management and nutrition needs.  As evidenced by diet history suggestive of increased intake of protein and total energy.    Handouts given during visit include: After-Visit Summary (AVS) Goals Sheet  Barriers to learning/adherence to lifestyle change: Longstanding eating behaviors as well as poor appetite.   For behavioral goals and recommendations, see Patient Instructions.    Monitoring/Evaluation:  Dietary intake, exercise, FBG, and body weight prn.

## 2022-11-24 NOTE — Patient Instructions (Signed)
Your diet has not been providing adequate calcium.  The recommended amount of calcium for adults 71 years and older is 1200 mg/day.  I strongly encourage you to take a low-dose calcium supplement (<250 mg) at least once daily.  Studies have shown increased risk of cardiac events and mortality, probably related to the high blood calcium concentration that may lead to calcification of the blood vessels.  I recommend that you NOT take your calcium supplement at the same meal with a calcium-rich food like yogurt or milk or even a non-dairy milk (which are all calcium-fortified).  Refer to the handout provided again today for information on calcium supplements and food sources.  If you can't find a low-dose calcium supplement, consider cutting a 500-mg tablet in half.  You can get a 250-mg calcium citrate at [facility] on [address]  Unfortunately, they will be closing in mid-October.    To help you meet your protein needs, it may be helpful to sometimes use a protein drink.  I recommend using one of these drinks (or powders) mixed with milk, which will allow you to use only half a dose of protein drink/powder, while still getting a good amount of protein partly from the milk (8 grams/cup).  (In my opinion, the protein drinks usually taste better when mixed in milk at less than full-strength.)    Behavioral Goals Remain the Same:  1. Eat at least 3 REAL meals and 1-2 snacks per day.  Eat breakfast within one hour of getting up.  Have something to eat at least every 5 hours while awake.  - A REAL breakfast needs to include both starch and protein foods.   - A REAL meal for lunch or dinner includes at least some protein, some starch, and vegetables and/or fruit.  What can you include at lunchtime for veg or fruit?  2. Limit starchy foods to TWO portions per meal and ONE per snack. ONE portion of a starchy food is equal to the following:  - ONE slice of bread (or its equivalent, such as half of a  hamburger bun).  - 1/2 cup of a "scoopable" starchy food such as potatoes or rice.  - 15 grams of Total Carbohydrate as shown on food label.  - 4 ounces of a sweet drink (including fruit juice).   Follow-up appt on Thursday, December 5 at 1:30 PM.

## 2022-12-01 ENCOUNTER — Other Ambulatory Visit: Payer: Self-pay

## 2022-12-01 ENCOUNTER — Encounter: Payer: Self-pay | Admitting: Internal Medicine

## 2022-12-01 ENCOUNTER — Ambulatory Visit (INDEPENDENT_AMBULATORY_CARE_PROVIDER_SITE_OTHER): Payer: Medicare PPO | Admitting: Internal Medicine

## 2022-12-01 VITALS — BP 138/72 | HR 65 | Temp 97.9°F | Resp 16 | Ht 63.5 in | Wt 111.6 lb

## 2022-12-01 DIAGNOSIS — E1169 Type 2 diabetes mellitus with other specified complication: Secondary | ICD-10-CM | POA: Diagnosis not present

## 2022-12-01 DIAGNOSIS — E039 Hypothyroidism, unspecified: Secondary | ICD-10-CM

## 2022-12-01 DIAGNOSIS — E785 Hyperlipidemia, unspecified: Secondary | ICD-10-CM

## 2022-12-01 DIAGNOSIS — Z79899 Other long term (current) drug therapy: Secondary | ICD-10-CM | POA: Diagnosis not present

## 2022-12-01 DIAGNOSIS — E559 Vitamin D deficiency, unspecified: Secondary | ICD-10-CM

## 2022-12-01 DIAGNOSIS — N1831 Chronic kidney disease, stage 3a: Secondary | ICD-10-CM

## 2022-12-01 DIAGNOSIS — I10 Essential (primary) hypertension: Secondary | ICD-10-CM

## 2022-12-01 DIAGNOSIS — E1122 Type 2 diabetes mellitus with diabetic chronic kidney disease: Secondary | ICD-10-CM | POA: Diagnosis not present

## 2022-12-01 NOTE — Progress Notes (Signed)
Future Appointments  Date Time Provider Department  12/01/2022 11:00 AM [name], MD [facility]  05/17/2023  2:00 PM Adela Glimpse, NP [facility]  08/24/2023 10:30 AM Adela Glimpse, NP [facility]    History of Present Illness:       This very nice 77 y.o.  MWF  presents for 3 month follow up with HTN, HLD, T2_NIDDM/CKD3, Hypothyroidism and Vitamin D Deficiency.         Patient is treated for HTN (2007) & BP has been controlled at home. Today's BP is at goal - 138/72 . Patient has had no complaints of any cardiac type chest pain, palpitations, dyspnea [facility] /PND, dizziness, claudication or dependent edema.       Hyperlipidemia is controlled with diet & meds. Patient denies myalgias or other med SE's. Last Lipids were at goal:   Lab Results  Component Value Date   CHOL 156 12/01/2022   HDL 64 12/01/2022   LDLCALC 75 12/01/2022   TRIG 87 12/01/2022   CHOLHDL 2.4 12/01/2022     Also, the patient has history of T2_NIDDM (2006) w/CKD3 a and has had no symptoms of reactive hypoglycemia, diabetic polys, paresthesias or visual blurring.   Patient is on Glipizide, Jardiance & Metformin sporadically and last A1c was not at goal:  Lab Results  Component Value Date   HGBA1C 9.2 (H) 12/01/2022    Wt Readings from Last 3 Encounters:  12/24/21 115 lb 3.2 oz (52.3 kg)  09/16/21 115 lb (52.2 kg)  05/14/21 117 lb 12.8 oz (53.4 kg)             Patient was dx'd Hypothyroid circa 2008 & has been on replacement since.                                                       Further, the patient also has history of Vitamin D Deficiency and supplements vitamin D without any suspected side-effects. Last vitamin D was still low:  Lab Results  Component Value Date   VD25OH 52 12/01/2022         Current Outpatient Medications on File Prior to Visit  Medication Sig   albuterol HFA inhaler Inhale 2 puffs  every 6  hours as needed   amLODipine  5 MG tablet Take 1  tablet Daily for BP   atorvastatin 40 MG tablet Take 1 tablet Daily for Cholesterol   azelastine (ASTELIN) 0.1 % nasal spray Place 2 sprays into nostrils 2 times daily.    Biotin 10 MG TABS Take daily.   CALCIUM-VIT D  500-200 MG-UNIT    Take 1 tablet  2  times daily with a meal.   CINNAMON  Take 1,000 Units  daily. 2 capsules daily   FLONASE nasal spray Place 2 sprays into both nostrils daily.   glipiZIDE 10 MG tablet TAKE 1 TABLET  3  TIMES DAILY   JARDIANCE 25 MG TABS tablet 1 TAB DAILY.   levothyroxine 50 MCG tablet TAKE 1 TABLET DAILY    lisinopril-hctz 20-25 MG tablet Take 1/2-1 tablet Daily for BP   Magnesium 250 MG TABS Take  daily.   metFORMIN -XR 500 MG 24 hr tablet Take 2 tablets 2 x /day with Meals    promethazine-DM 6.25-15 MG/5ML syrup Take 5 mLs  4 \  times daily as needed    traMADol (ULTRAM) 50 MG tablet Take 1 tab as needed every 6 hours    traZODone (DESYREL) 150 MG tablet Take 1/2 to 1 tablet at bedtime as neede    No Known Allergies  PMHx:   Past Medical History:  Diagnosis Date   Diabetes mellitus    Headache(784.0)    hx migraines   Hyperlipidemia    Hypertension    Hypothyroidism     Immunization History  Administered Date(s) Administered   Influenza Split 02/13/2013   Influenza, High Dose  12/07/2014, 12/09/2015, 12/15/2016, 02/08/2018, 12/16/2018   Influenza 03/26/2014   PFIZER SARS-COV-2 Vacc 04/20/2019, 05/18/2019   Pneumococcal -13 12/09/2015   Pneumococcal-23 03/23/2008   Tdap 07/17/2010   Zoster Recombinat (Shingrix) 12/16/2018, 04/06/2019   Zoster, Live 01/14/2012     Past Surgical History:  Procedure Laterality Date   ANTERIOR CERVICAL DECOMP/DISCECTOMY FUSION  10/22/2011   Procedure: ANTERIOR CERVICAL DECOMPRESSION/DISCECTOMY FUSION 1 LEVEL;  Surgeon: Emilee Hero, MD;  Location: [facility] OR;  Service: Orthopedics;  Laterality: Right;  Anterior cervical decompression fusion, cervical 5-6 with instrumentation and allograft.   BREAST  EXCISIONAL BIOPSY Left    30 years ago - benign   BREAST SURGERY  80's   lft cyst    FHx:    Reviewed / unchanged  SHx:    Reviewed / unchanged   Systems Review:  Constitutional: Denies fever, chills, wt changes, headaches, insomnia, fatigue, night sweats, change in appetite. Eyes: Denies redness, blurred vision, diplopia, discharge, itchy, watery eyes.  ENT: Denies discharge, congestion, post nasal drip, epistaxis, sore throat, earache, hearing loss, dental pain, tinnitus, vertigo, sinus pain, snoring.  CV: Denies chest pain, palpitations, irregular heartbeat, syncope, dyspnea, diaphoresis, orthopnea, PND, claudication or edema. Respiratory: denies cough, dyspnea, DOE, pleurisy, hoarseness, laryngitis, wheezing.  Gastrointestinal: Denies dysphagia, odynophagia, heartburn, reflux, water brash, abdominal pain or cramps, nausea, vomiting, bloating, diarrhea, constipation, hematemesis, melena, hematochezia  or hemorrhoids. Genitourinary: Denies dysuria, frequency, urgency, nocturia, hesitancy, discharge, hematuria or flank pain. Musculoskeletal: Denies arthralgias, myalgias, stiffness, jt. swelling, pain, limping or strain/sprain.  Skin: Denies pruritus, rash, hives, warts, acne, eczema or change in skin lesion(s). Neuro: No weakness, tremor, incoordination, spasms, paresthesia or pain. Psychiatric: Denies confusion, memory loss or sensory loss. Endo: Denies change in weight, skin or hair change.  Heme/Lymph: No excessive bleeding, bruising or enlarged lymph nodes.  Physical Exam  BP 138/72   Pulse 65   Temp 97.9 F (36.6 C)   Resp 16   Ht 5' 3.5" (1.613 m)   Wt 111 lb 9.6 oz (50.6 kg)   SpO2 97%   BMI 19.46 kg/m   Appears  well nourished, well groomed  and in no distress.  Eyes: PERRLA, EOMs, conjunctiva no swelling or erythema. Sinuses: No frontal/maxillary tenderness ENT/Mouth: EAC's clear, TM's nl w/o erythema, bulging. Nares clear w/o erythema, swelling, exudates.  Oropharynx clear without erythema or exudates. Oral hygiene is good. Tongue normal, non obstructing. Hearing intact.  Neck: Supple. Thyroid not palpable. Car 2+/2+ without bruits, nodes or JVD. Chest: Respirations nl with BS clear & equal w/o rales, rhonchi, wheezing or stridor.  Cor: Heart sounds normal w/ regular rate and rhythm without sig. murmurs, gallops, clicks or rubs. Peripheral pulses normal and equal  without edema.  Abdomen: Soft & bowel sounds normal. Non-tender w/o guarding, rebound, hernias, masses or organomegaly.  Lymphatics: Unremarkable.  Musculoskeletal: Full ROM all peripheral extremities, joint stability, 5/5 strength and normal gait.  Skin: Warm, dry  without exposed rashes, lesions or ecchymosis apparent.  Neuro: Cranial nerves intact, reflexes equal bilaterally. Sensory-motor testing grossly intact. Tendon reflexes grossly intact.  Pysch: Alert & oriented x 3.  Insight and judgement nl & appropriate. No ideations.  Assessment and Plan:  1. Essential hypertension  - CBC with Differential/Platelet - COMPLETE METABOLIC PANEL WITH GFR - Magnesium - TSH  2. Hyperlipidemia associated with type 2 diabetes mellitus (HCC)  - Lipid panel - TSH  3. Type 2 diabetes mellitus with stage 3a chronic kidney  disease, without long-term current use of insulin (HCC)  - Hemoglobin A1c - Insulin, random  4. Vitamin D deficiency  - VITAMIN D 25 Hydroxy   5. Hypothyroidism, unspecified type  - TSH  6. Medication management  - CBC with Differential/Platelet - COMPLETE METABOLIC PANEL WITH GFR - Magnesium - Lipid panel - TSH - Hemoglobin A1c - Insulin, random - VITAMIN D 25 Hydroxy         Discussed  regular exercise, BP monitoring, weight control to achieve/maintain BMI less than 25 and discussed med and SE's. Recommended labs to assess and monitor clinical status with further disposition pending results of labs.  I discussed the assessment and treatment plan with  the patient. The patient was provided an opportunity to ask questions and all were answered. The patient agreed with the plan and demonstrated an understanding of the instructions.  I provided over 30 minutes of exam, counseling, chart review and  complex critical decision making.        The patient was advised to call back or seek an in-person evaluation if the symptoms worsen or if the condition fails to improve as anticipated.   Marinus Maw, MD

## 2022-12-01 NOTE — Patient Instructions (Signed)

## 2022-12-02 LAB — COMPLETE METABOLIC PANEL WITH GFR
AG Ratio: 1.8 (calc) (ref 1.0–2.5)
ALT: 25 U/L (ref 6–29)
AST: 19 U/L (ref 10–35)
Albumin: 4.4 g/dL (ref 3.6–5.1)
Alkaline phosphatase (APISO): 93 U/L (ref 37–153)
BUN/Creatinine Ratio: 21 (calc) (ref 6–22)
BUN: 22 mg/dL (ref 7–25)
CO2: 28 mmol/L (ref 20–32)
Calcium: 9.7 mg/dL (ref 8.6–10.4)
Chloride: 104 mmol/L (ref 98–110)
Creat: 1.04 mg/dL — ABNORMAL HIGH (ref 0.60–1.00)
Globulin: 2.4 g/dL (ref 1.9–3.7)
Glucose, Bld: 134 mg/dL — ABNORMAL HIGH (ref 65–99)
Potassium: 4.4 mmol/L (ref 3.5–5.3)
Sodium: 140 mmol/L (ref 135–146)
Total Bilirubin: 0.5 mg/dL (ref 0.2–1.2)
Total Protein: 6.8 g/dL (ref 6.1–8.1)
eGFR: 56 mL/min/{1.73_m2} — ABNORMAL LOW (ref >=60)

## 2022-12-02 LAB — CBC WITH DIFFERENTIAL/PLATELET
Absolute Monocytes: 580 {cells}/uL (ref 200–950)
Basophils Absolute: 46 {cells}/uL (ref 0–200)
Basophils Relative: 0.5 %
Eosinophils Absolute: 83 {cells}/uL (ref 15–500)
Eosinophils Relative: 0.9 %
HCT: 44.6 % (ref 35.0–45.0)
Hemoglobin: 15.2 g/dL (ref 11.7–15.5)
Lymphs Abs: 1316 {cells}/uL (ref 850–3900)
MCH: 29.3 pg (ref 27.0–33.0)
MCHC: 34.1 g/dL (ref 32.0–36.0)
MCV: 86.1 fL (ref 80.0–100.0)
MPV: 10.9 fL (ref 7.5–12.5)
Monocytes Relative: 6.3 %
Neutro Abs: 7176 {cells}/uL (ref 1500–7800)
Neutrophils Relative %: 78 %
Platelets: 305 10*3/uL (ref 140–400)
RBC: 5.18 10*6/uL — ABNORMAL HIGH (ref 3.80–5.10)
RDW: 12.8 % (ref 11.0–15.0)
Total Lymphocyte: 14.3 %
WBC: 9.2 10*3/uL (ref 3.8–10.8)

## 2022-12-02 LAB — LIPID PANEL
Cholesterol: 156 mg/dL (ref <200)
HDL: 64 mg/dL (ref >=50)
LDL Cholesterol (Calc): 75 mg/dL
Non-HDL Cholesterol (Calc): 92 mg/dL (ref <130)
Total CHOL/HDL Ratio: 2.4 (calc) (ref <5.0)
Triglycerides: 87 mg/dL (ref <150)

## 2022-12-02 LAB — HEMOGLOBIN A1C
Hgb A1c MFr Bld: 9.2 %{Hb} — ABNORMAL HIGH (ref <5.7)
Mean Plasma Glucose: 217 mg/dL
eAG (mmol/L): 12 mmol/L

## 2022-12-02 LAB — TSH: TSH: 3.38 m[IU]/L (ref 0.40–4.50)

## 2022-12-02 LAB — VITAMIN D 25 HYDROXY (VIT D DEFICIENCY, FRACTURES): Vit D, 25-Hydroxy: 52 ng/mL (ref 30–100)

## 2022-12-02 LAB — MAGNESIUM: Magnesium: 2.3 mg/dL (ref 1.5–2.5)

## 2022-12-02 LAB — INSULIN, RANDOM: Insulin: 2.8 u[IU]/mL

## 2022-12-02 NOTE — Progress Notes (Signed)
<>*<>*<>*<>*<>*<>*<>*<>*<>*<>*<>*<>*<>*<>*<>*<>*<>*<>*<>*<>*<>*<>*<>*<>*<> <>*<>*<>*<>*<>*<>*<>*<>*<>*<>*<>*<>*<>*<>*<>*<>*<>*<>*<>*<>*<>*<>*<>*<>*<>  -Test results slightly outside the reference range are not unusual. If there is anything important, I will review this with you,  otherwise it is considered normal test values.  If you have further questions,  please do not hesitate to contact me at the office or via My Chart.   <>*<>*<>*<>*<>*<>*<>*<>*<>*<>*<>*<>*<>*<>*<>*<>*<>*<>*<>*<>*<>*<>*<>*<>*<> <>*<>*<>*<>*<>*<>*<>*<>*<>*<>*<>*<>*<>*<>*<>*<>*<>*<>*<>*<>*<>*<>*<>*<>*<>  -  Kidney function Still Stage 3a & Stable     - Very important to drink adequate amounts of fluids to prevent permanent damage    - Recommend drink at least 6 bottles (16 ounces) of fluids /water /day = 96 Oz ~100 oz  - 100 oz = 3,000 cc or 3 liters / day  - >> That's 1 &1/2 bottles of a 2 liter soda bottle /day !   <>*<>*<>*<>*<>*<>*<>*<>*<>*<>*<>*<>*<>*<>*<>*<>*<>*<>*<>*<>*<>*<>*<>*<>*<> <>*<>*<>*<>*<>*<>*<>*<>*<>*<>*<>*<>*<>*<>*<>*<>*<>*<>*<>*<>*<>*<>*<>*<>*<>  -  A1c = 9.2% is Worse = average Glucose 217 mg% -      Being diabetic has a  300% increased risk for heart attack,                                              stroke, cancer, and alzheimer- type vascular dementia.   It is very important that you work harder with diet by                            avoiding all foods that are white except chicken,  fish & calliflower.  - Avoid white rice  (brown & wild rice is OK),   - Avoid white potatoes  (sweet potatoes in moderation is OK),   White bread or wheat bread or anything made out of   white flour like bagels, donuts, rolls, buns, biscuits, cakes,  - pastries, cookies, pizza crust, and pasta (made from  white flour & egg whites)   - vegetarian pasta or spinach or wheat pasta is OK.  - Multigrain breads like Arnold's, Pepperidge [employer] or                                 multigrain sandwich  thins or high fiber breads like                                                           [place] bread or "Dave's Killer" breads that are  4 to 5 grams fiber per slice !  are best.    Diet, exercise and weight loss can reverse and cure diabetes in the early stages.    - Diet, exercise and weight loss is very important in the   control and prevention of complications of diabetes which  affects every system in your body, ie.   -Brain - dementia/stroke,  - eyes - glaucoma/blindness,  - heart - heart attack/heart failure,  - kidneys - dialysis,  - stomach - gastric paralysis,  - intestines - malabsorption,  - nerves - severe painful neuritis,  - circulation - gangrene & loss of a leg(s)  - and finally  . . . . . . . . . . . . . . . . . Marland Kitchen    -  cancer and Alzheimers.  <>*<>*<>*<>*<>*<>*<>*<>*<>*<>*<>*<>*<>*<>*<>*<>*<>*<>*<>*<>*<>*<>*<>*<>*<> <>*<>*<>*<>*<>*<>*<>*<>*<>*<>*<>*<>*<>*<>*<>*<>*<>*<>*<>*<>*<>*<>*<>*<>*<>  -  Vitamin D = 52 is a little low   - Vitamin D goal is between 70-100.   - Please ADD another 2,000 units  Vitamin D as/ day   - It is very important as a natural anti-inflammatory and helping the                           immune system protect against viral infections, like the Covid-19    helping hair, skin, and nails, as well as reducing stroke and heart attack risk.   - It helps your bones and helps with mood.  - It also decreases numerous cancer risks so please                                                                                           take it as directed.   - Low Vit D is associated with a 200-300% higher risk for CANCER   and 200-300% higher risk for HEART   ATTACK  &  STROKE.    - It is also associated with higher death rate at younger ages,   autoimmune diseases like Rheumatoid arthritis, Lupus, Multiple Sclerosis.     - Also many other serious conditions                                       like depression, Alzheimer's Dementia,                                                                              muscle aches, fatigue, fibromyalgia   ^>^>^>^>^>^>^>^>^>^>^>^>^>^>^>^>^>^>^>^>^>^>^>^>^>^>^>^>^>^>^>^>^>^>^>^>^  - All Else - CBC -  Electrolytes - Liver - Magnesium & Thyroid    - all  Normal / OK  <>*<>*<>*<>*<>*<>*<>*<>*<>*<>*<>*<>*<>*<>*<>*<>*<>*<>*<>*<>*<>*<>*<>*<>*<> <>*<>*<>*<>*<>*<>*<>*<>*<>*<>*<>*<>*<>*<>*<>*<>*<>*<>*<>*<>*<>*<>*<>*<>*<>

## 2022-12-03 ENCOUNTER — Other Ambulatory Visit: Payer: Self-pay

## 2022-12-03 ENCOUNTER — Ambulatory Visit (INDEPENDENT_AMBULATORY_CARE_PROVIDER_SITE_OTHER): Payer: Medicare PPO | Admitting: Nurse Practitioner

## 2022-12-03 ENCOUNTER — Encounter: Payer: Self-pay | Admitting: Nurse Practitioner

## 2022-12-03 VITALS — HR 83 | Temp 98.6°F

## 2022-12-03 DIAGNOSIS — U071 COVID-19: Secondary | ICD-10-CM

## 2022-12-03 MED ORDER — PROMETHAZINE-DM 6.25-15 MG/5ML PO SYRP
5.0000 mL | ORAL_SOLUTION | Freq: Four times a day (QID) | ORAL | 0 refills | Status: AC | PRN
Start: 2022-12-03 — End: ?

## 2022-12-03 NOTE — Progress Notes (Signed)
THIS ENCOUNTER IS A VIRTUAL VISIT DUE TO COVID-19 - PATIENT WAS NOT SEEN IN THE OFFICE.  PATIENT HAS CONSENTED TO VIRTUAL VISIT / TELEMEDICINE VISIT   Virtual Visit via telephone Note  I connected with  Elizabeth May on 12/03/2022 by telephone.  I verified that I am speaking with the correct person using two identifiers.    I discussed the limitations of evaluation and management by telemedicine and the availability of in person appointments. The patient expressed understanding and agreed to proceed.  History of Present Illness:  Pulse 83   Temp 98.6 F (37 C)   SpO2 99%  77 y.o. patient contacted office reporting URI sx nasal congestion, sore throat, body aches. she tested positive by in home test. OV was conducted by telephone to minimize exposure. This patient has been vaccinated for covid 19, last 12/2020  Sx began 2 days ago with sore throat  Treatments tried so far: ASA, Promethazine cough syrup, now out of medication - reports effective.  Exposures: Unknown   Medications  Current Outpatient Medications (Endocrine & Metabolic):    empagliflozin (JARDIANCE) 25 MG TABS tablet, START 1/2 TAB DAILY FOR 1 MONTH THEN INCREASE TO 1 TAB DAILY. (Patient taking differently: Take one tablet daily)   glipiZIDE (GLUCOTROL) 10 MG tablet, TAKE 1/2-1 TABLET BY MOUTH 2  TIMES DAILY WITH MEALS. Skip this medication if low sugars, throwing up or not eating.   levothyroxine (SYNTHROID) 50 MCG tablet, TAKE 1 TABLET DAILY ON AN EMPTY STOMACH WITH ONLY WATER FOR 30 MINUTES   metFORMIN (GLUCOPHAGE-XR) 500 MG 24 hr tablet, Take 2 tablets 2 x /day with Meals for Diabetes  Current Outpatient Medications (Cardiovascular):    amLODipine (NORVASC) 5 MG tablet, TAKE 1 TABLET EVERY DAY FOR BLOOD PRESSURE   atorvastatin (LIPITOR) 40 MG tablet, Take 1 tablet Daily for Cholesterol   lisinopril-hydrochlorothiazide (ZESTORETIC) 10-12.5 MG tablet, Take 1 tablet by mouth daily.     Current Outpatient  Medications (Other):    Biotin 10 MG TABS, Take by mouth daily.   Calcium Carbonate-Vitamin D (CALCIUM-VITAMIN D) 500-200 MG-UNIT per tablet, Take 1 tablet by mouth 2 (two) times daily with a meal.   CINNAMON PO, Take 1,000 Units by mouth daily. 2 capsules daily   mirabegron ER (MYRBETRIQ) 50 MG TB24 tablet, Take 1 tablet (50 mg total) by mouth daily.   traZODone (DESYREL) 150 MG tablet, Take 1 tablet (150 mg total) by mouth at bedtime. Take 1/2 to 1 tablet at bedtime as needed for sleep   triamcinolone cream (KENALOG) 0.5 %, Apply 1 Application topically 2 (two) times daily.  Allergies: No Known Allergies  Problem list She has Essential hypertension; Cervical radiculopathy at C6; Type 2 diabetes mellitus (HCC); Hypothyroidism; Hyperlipidemia associated with type 2 diabetes mellitus (HCC); Vitamin D deficiency; Medication management; CKD stage 3 due to type 2 diabetes mellitus (HCC); BMI 20.0-20.9, adult; Insomnia; RBBB; Arthritis of right hand; Arthritis of left hand; B12 deficiency; and Osteopenia on their problem list.   Social History:   reports that she has never smoked. She has never used smokeless tobacco. She reports current alcohol use of about 7.0 standard drinks of alcohol per week. She reports that she does not use drugs.  Observations/Objective:  General : Well sounding patient in no apparent distress HEENT: no hoarseness, no cough for duration of visit Lungs: speaks in complete sentences, no audible wheezing, no apparent distress Neurological: alert, oriented x 3 Psychiatric: pleasant, judgement appropriate   Assessment and Plan:  Covid 19 Covid 19 positive per rapid screening test in home Risk factors include: She has Essential hypertension; Cervical radiculopathy at C6; Type 2 diabetes mellitus (HCC); Hypothyroidism; Hyperlipidemia associated with type 2 diabetes mellitus (HCC); Vitamin D deficiency; Medication management; CKD stage 3 due to type 2 diabetes mellitus (HCC);  BMI 20.0-20.9, adult; Insomnia; RBBB; Arthritis of right hand; Arthritis of left hand; B12 deficiency; and Osteopenia on their problem list. Symptoms are: mild Due to co morbid conditions and risk factors, discussed antivirals  Immue support reviewed Vitamin C, Vitamin D, Zinc Take tylenol PRN temp 101+ Push hydration Regular ambulation or calf exercises exercises for clot prevention and 81 mg ASA unless contraindicated Sx supportive therapy suggested Follow up via mychart or telephone if needed Advised patient obtain O2 monitor; present to ED if persistently <90% or with severe dyspnea, CP, fever uncontrolled by tylenol, confusion, sudden decline       Should remain in isolation 5 days from testing positive and then wear a mask when around other people for the following 5 days  COVID-19  - promethazine-dextromethorphan (PROMETHAZINE-DM) 6.25-15 MG/5ML syrup; Take 5 mLs by mouth 4 (four) times daily as needed for cough.  Dispense: 240 mL; Refill: 0    Follow Up Instructions:  I discussed the assessment and treatment plan with the patient. The patient was provided an opportunity to ask questions and all were answered. The patient agreed with the plan and demonstrated an understanding of the instructions.   The patient was advised to call back or seek an in-person evaluation if the symptoms worsen or if the condition fails to improve as anticipated.  I provided 15 minutes of non-face-to-face time during this encounter.   Adela Glimpse, NP

## 2022-12-03 NOTE — Patient Instructions (Signed)

## 2022-12-05 ENCOUNTER — Encounter: Payer: Self-pay | Admitting: Internal Medicine

## 2022-12-21 ENCOUNTER — Ambulatory Visit (INDEPENDENT_AMBULATORY_CARE_PROVIDER_SITE_OTHER): Payer: Medicare PPO | Admitting: Nurse Practitioner

## 2022-12-21 ENCOUNTER — Encounter: Payer: Self-pay | Admitting: Nurse Practitioner

## 2022-12-21 VITALS — BP 110/74 | HR 81 | Temp 97.7°F | Ht 63.5 in | Wt 104.8 lb

## 2022-12-21 DIAGNOSIS — J0100 Acute maxillary sinusitis, unspecified: Secondary | ICD-10-CM

## 2022-12-21 DIAGNOSIS — N1831 Chronic kidney disease, stage 3a: Secondary | ICD-10-CM

## 2022-12-21 DIAGNOSIS — E1122 Type 2 diabetes mellitus with diabetic chronic kidney disease: Secondary | ICD-10-CM | POA: Diagnosis not present

## 2022-12-21 DIAGNOSIS — I10 Essential (primary) hypertension: Secondary | ICD-10-CM

## 2022-12-21 MED ORDER — AZITHROMYCIN 250 MG PO TABS
ORAL_TABLET | ORAL | 1 refills | Status: DC
Start: 2022-12-21 — End: 2023-03-23

## 2022-12-21 MED ORDER — PROMETHAZINE-DM 6.25-15 MG/5ML PO SYRP
5.0000 mL | ORAL_SOLUTION | Freq: Four times a day (QID) | ORAL | 0 refills | Status: DC | PRN
Start: 2022-12-21 — End: 2023-08-18

## 2022-12-21 NOTE — Progress Notes (Signed)
Assessment and Plan:  [name] was seen today for otalgia.  Diagnoses and all orders for this visit:  Essential hypertension - continue medications: Lisinopril/hydrochlorothiazide 10/12.5 mg 1 tab PO every day and amlodipine 5 mg every day   - continue DASH diet, exercise and monitor at home. Call if greater than 130/80.   Type 2 diabetes mellitus with stage 3a chronic kidney disease, without long-term current use of insulin (HCC) Continue medications:Metformin 500 mg 2 tabs BID, Jardiance 25 mg every day Glipizide 1/2 -1 tab once a day with biggest meal.  Continue diet and exercise.  Perform daily foot/skin check, notify office of any concerning changes.  Check A1C    Acute maxillary sinusitis, recurrence not specified Use zithromax 250 mg 2 tabs the first day and then 1 tab a day for 4 more days Mucinex to thin secretions, Push fluids Promethazine DM cough syrup every 8 hours as needed If symptoms are not improved by next week notify the office -     promethazine-dextromethorphan (PROMETHAZINE-DM) 6.25-15 MG/5ML syrup; Take 5 mLs by mouth 4 (four) times daily as needed for cough. -     azithromycin (ZITHROMAX) 250 MG tablet; Take 2 tablets (500 mg) on  Day 1,  followed by 1 tablet (250 mg) once daily on Days 2 through 5.       Further disposition pending results of labs. Discussed med's effects and SE's.   Over 30 minutes of exam, counseling, chart review, and critical decision making was performed.   Future Appointments  Date Time Provider Department Center  02/25/2023  1:30 PM Linna Darner, RD [facility]  03/02/2023  2:30 PM Adela Glimpse, NP [facility] None  03/25/2023  3:30 PM [facility] DX DEXA 1 [facility]  05/17/2023  2:00 PM Cranford, Archie Patten, NP [facility] None  08/24/2023 10:30 AM Cranford, Archie Patten, NP [facility] None    ------------------------------------------------------------------------------------------------------------------   HPI BP  110/74   Pulse 81   Temp 97.7 F (36.5 C)   Ht 5' 3.5" (1.613 m)   Wt 104 lb 12.8 oz (47.5 kg)   SpO2 99%   BMI 18.27 kg/m   77 y.o.female presents for Cough and congestion that started 2 days ago, not productive. She is experiencing right ear pain and right face pain. She had Covid 12/03/22 and has had very little appetite.  Denies fever, sore throat, body aches. She has not tried any over the counter medication.   BP well controlled with Amlodipine 5 mg every day, Lisinopril/hydrochlorothiazide 10/12.5 mg every day  BP Readings from Last 3 Encounters:  12/21/22 110/74  12/01/22 138/72  08/18/22 (!) 140/74  Denies headaches, chest pain, shortness of breath and dizziness   Continue Glipizide 10 mg QD with biggest meal, Metformin 500 mg 2 tabs BID, Jardiance 25 mg 1 tab every day. Blood sugars are not controlled. She is trying to drink more water  Lab Results  Component Value Date   HGBA1C 9.2 (H) 12/01/2022   Lab Results  Component Value Date   EGFR 56 (L) 12/01/2022    BMI is Body mass index is 18.27 kg/m., Her appetite is coming back minimally Wt Readings from Last 3 Encounters:  12/21/22 104 lb 12.8 oz (47.5 kg)  12/01/22 111 lb 9.6 oz (50.6 kg)  11/24/22 113 lb (51.3 kg)     Past Medical History:  Diagnosis Date   Diabetes mellitus    Headache(784.0)    hx migraines   Hyperlipidemia    Hypertension  Hypothyroidism      No Known Allergies  Current Outpatient Medications on File Prior to Visit  Medication Sig   amLODipine (NORVASC) 5 MG tablet TAKE 1 TABLET EVERY DAY FOR BLOOD PRESSURE   atorvastatin (LIPITOR) 40 MG tablet Take 1 tablet Daily for Cholesterol   Biotin 10 MG TABS Take by mouth daily. PRN   Calcium Carbonate-Vitamin D (CALCIUM-VITAMIN D) 500-200 MG-UNIT per tablet Take 1 tablet by mouth 2 (two) times daily with a meal.   CINNAMON PO Take 1,000 Units by mouth daily. 2 capsules daily   empagliflozin (JARDIANCE) 25 MG TABS tablet START 1/2 TAB  DAILY FOR 1 MONTH THEN INCREASE TO 1 TAB DAILY. (Patient taking differently: Take one tablet daily)   glipiZIDE (GLUCOTROL) 10 MG tablet TAKE 1/2-1 TABLET BY MOUTH 2  TIMES DAILY WITH MEALS. Skip this medication if low sugars, throwing up or not eating.   levothyroxine (SYNTHROID) 50 MCG tablet TAKE 1 TABLET DAILY ON AN EMPTY STOMACH WITH ONLY WATER FOR 30 MINUTES   lisinopril-hydrochlorothiazide (ZESTORETIC) 10-12.5 MG tablet Take 1 tablet by mouth daily.   metFORMIN (GLUCOPHAGE-XR) 500 MG 24 hr tablet Take 2 tablets 2 x /day with Meals for Diabetes   mirabegron ER (MYRBETRIQ) 50 MG TB24 tablet Take 1 tablet (50 mg total) by mouth daily.   traZODone (DESYREL) 150 MG tablet Take 1 tablet (150 mg total) by mouth at bedtime. Take 1/2 to 1 tablet at bedtime as needed for sleep   triamcinolone cream (KENALOG) 0.5 % Apply 1 Application topically 2 (two) times daily.   No current facility-administered medications on file prior to visit.    ROS: all negative except above.   Physical Exam:  BP 110/74   Pulse 81   Temp 97.7 F (36.5 C)   Ht 5' 3.5" (1.613 m)   Wt 104 lb 12.8 oz (47.5 kg)   SpO2 99%   BMI 18.27 kg/m   General Appearance: Thin frail female, in no apparent distress. Eyes: PERRLA, EOMs, conjunctiva no swelling or erythema Sinuses: Positive maxillary tenderness ENT/Mouth: Ext aud canals clear, TMs without erythema, bulging. No erythema, swelling, or exudate on post pharynx.  Hearing normal.  Neck: Supple, thyroid normal.  Respiratory: Respiratory effort normal, BS equal bilaterally without rales, rhonchi, wheezing or stridor.  Cardio: RRR with no MRGs. Brisk peripheral pulses without edema.  Abdomen: Soft, + BS.  Non tender, no guarding, rebound, hernias, masses. Lymphatics: Positive right submandibular adenopathy Musculoskeletal: Full ROM, 5/5 strength, normal gait.  Skin: Warm, dry. Ecchymoses of forearms Neuro: Cranial nerves intact. Normal muscle tone, no cerebellar  symptoms. Sensation intact.  Psych: Awake and oriented X 3, normal affect, Insight and Judgment appropriate.     Elizabeth Dick, NP 2:18 PM [facility]

## 2022-12-21 NOTE — Patient Instructions (Signed)
Use zithromax 250 mg 2 tabs the first day and then 1 tab a day for 4 more days  Mucinex to thin secretions  Promethazine DM cough syrup every 8 hours as needed  Sinus Infection, Adult A sinus infection is soreness and swelling (inflammation) of your sinuses. Sinuses are hollow spaces in the bones around your face. They are located: Around your eyes. In the middle of your forehead. Behind your nose. In your cheekbones. Your sinuses and nasal passages are lined with a fluid called mucus. Mucus drains out of your sinuses. Swelling can trap mucus in your sinuses. This lets germs (bacteria, virus, or fungus) grow, which leads to infection. Most of the time, this condition is caused by a virus. What are the causes? Allergies. Asthma. Germs. Things that block your nose or sinuses. Growths in the nose (nasal polyps). Chemicals or irritants in the air. A fungus. This is rare. What increases the risk? Having a weak body defense system (immune system). Doing a lot of swimming or diving. Using nasal sprays too much. Smoking. What are the signs or symptoms? The main symptoms of this condition are pain and a feeling of pressure around the sinuses. Other symptoms include: Stuffy nose (congestion). This may make it hard to breathe through your nose. Runny nose (drainage). Soreness, swelling, and warmth in the sinuses. A cough that may get worse at night. Being unable to smell and taste. Mucus that collects in the throat or the back of the nose (postnasal drip). This may cause a sore throat or bad breath. Being very tired (fatigued). A fever. How is this diagnosed? Your symptoms. Your medical history. A physical exam. Tests to find out if your condition is short-term (acute) or long-term (chronic). Your doctor may: Check your nose for growths (polyps). Check your sinuses using a tool that has a light on one end (endoscope). Check for allergies or germs. Do imaging tests, such as an MRI or  CT scan. How is this treated? Treatment for this condition depends on the cause and whether it is short-term or long-term. If caused by a virus, your symptoms should go away on their own within 10 days. You may be given medicines to relieve symptoms. They include: Medicines that shrink swollen tissue in the nose. A spray that treats swelling of the nostrils. Rinses that help get rid of thick mucus in your nose (nasal saline washes). Medicines that treat allergies (antihistamines). Over-the-counter pain relievers. If caused by bacteria, your doctor may wait to see if you will get better without treatment. You may be given antibiotic medicine if you have: A very bad infection. A weak body defense system. If caused by growths in the nose, surgery may be needed. Follow these instructions at home: Medicines Take, use, or apply over-the-counter and prescription medicines only as told by your doctor. These may include nasal sprays. If you were prescribed an antibiotic medicine, take it as told by your doctor. Do not stop taking it even if you start to feel better. Hydrate and humidify  Drink enough water to keep your pee (urine) pale yellow. Use a cool mist humidifier to keep the humidity level in your home above 50%. Breathe in steam for 10-15 minutes, 3-4 times a day, or as told by your doctor. You can do this in the bathroom while a hot shower is running. Try not to spend time in cool or dry air. Rest Rest as much as you can. Sleep with your head raised (elevated). Make sure you  get enough sleep each night. General instructions  Put a warm, moist washcloth on your face 3-4 times a day, or as often as told by your doctor. Use nasal saline washes as often as told by your doctor. Wash your hands often with soap and water. If you cannot use soap and water, use hand sanitizer. Do not smoke. Avoid being around people who are smoking (secondhand smoke). Keep all follow-up visits. Contact a  doctor if: You have a fever. Your symptoms get worse. Your symptoms do not get better within 10 days. Get help right away if: You have a very bad headache. You cannot stop vomiting. You have very bad pain or swelling around your face or eyes. You have trouble seeing. You feel confused. Your neck is stiff. You have trouble breathing. These symptoms may be an emergency. Get help right away. Call 911. Do not wait to see if the symptoms will go away. Do not drive yourself to the hospital. Summary A sinus infection is swelling of your sinuses. Sinuses are hollow spaces in the bones around your face. This condition is caused by tissues in your nose that become inflamed or swollen. This traps germs. These can lead to infection. If you were prescribed an antibiotic medicine, take it as told by your doctor. Do not stop taking it even if you start to feel better. Keep all follow-up visits. This information is not intended to replace advice given to you by your health care provider. Make sure you discuss any questions you have with your health care provider. Document Revised: 02/11/2021 Document Reviewed: 02/11/2021 Elsevier Patient Education  2024 [employer].

## 2023-01-31 ENCOUNTER — Other Ambulatory Visit: Payer: Self-pay | Admitting: Nurse Practitioner

## 2023-01-31 DIAGNOSIS — E039 Hypothyroidism, unspecified: Secondary | ICD-10-CM

## 2023-02-05 ENCOUNTER — Other Ambulatory Visit: Payer: Self-pay | Admitting: Nurse Practitioner

## 2023-02-24 NOTE — Progress Notes (Unsigned)
Medical Nutrition Therapy Appt start time: 1330 end time: 1430 (1 hour) Primary concerns today: Blood sugar control and CKD .  PCP Elizabeth Cowboy, MD / Elizabeth Glimpse, NP Husband Elizabeth May  Relevant history/background: Elizabeth May was referred by Elizabeth Glimpse, NP for MNT related to DM.  Other diagnoses include CKD stage 3, osteopenia, hypothyroidism, HTN, HLD, Vit D defic, and insomnia.  (Vit B12 defic remains on problem list, but measures going back to 2016 indicate normal range.)  Despite efforts at a healthy diet, A1c on 5/28 was 8.5%, down from 9.2 in Feb, but up from the 8.3 in Oct 2023.  Last DXA scan [facility]) was Aug 2022: Spine T-score -1.9; LFN T-score -1.7.  Lab's on 08/18/22: 25-OH vit D: 60 ng/mL.   Assessment:  Elizabeth May's A1c on 12/01/22 was 9.2, and lipid profile was all within normal range.  Other 12/01/22 lab results: 25OH vit D 52 ng/mL; eGFR 56 (=stage 3a).  We talked today about the inadequacy of her diet (especially Ca, protein, and energy), and need for tighter glycemc control.  She also agreed to make an appt with Elizabeth May to discuss enrolling in the Liberate study in which she will start using CGM to monitor BG.  Elizabeth May brought up a "cure" for diabetes she heard about on [employer] ("a woman from [place]" whose name she could not remember.  I did my best to explain that there are fundamental behaviors with an abundance of supportive evidence that will also keep her BG levels in a desirable range.   She has made no change to Ca supplement despite repeated discussion and written instructions (provided again today).  Elizabeth May has managed to limit starchy foods, as recommended, although glycemic control would likely benefit from mor balanced meals including adequate protein and fiber (as well as daily physical activity vs. 3-4 X wk).    Learning Readiness: Ready  FBG:    120s to 140s; checking 3-4 X wk.   Weight:   107.0 lb; 113.0 lb on 11/24/22; ht is 63.5".  Calcium suppl: Does not know  name or dose, but previous report suggests dose is 500 mg.  Takes 1 tab 1-2 X day.   Recent eat pattern:  2-3 meals and 0-1 snack per day.  Bkfst is frequently 1 pc toast with butter & low-sugar jelly and 1 c coffee.   Recent phys activ:  Walks at gym 45-60 min 3-4 X wk. Sleep:    Estimates ~7 hrs/night.  To bed ~10 or 11 PM, and up at 6-7:30.    24-hr recall suggests intake of ~1410 kcal:  (Up at 7 AM; drank 2-4 oz water with meds)  B (8:15 AM)-  1 slc high-fiber toast, 1/2 tsp butter, 1/2 tsp slow-sugar jelly, 1 c coffee, 1 tbsp cream, 1 tsp Splenda  140 Snk ( AM)-  Coffee?                 40 L (1 PM)-  2 slc livermush, 3-4 tbsp cranberry sauce, 1 slc high-fiber bread, 1/2 tsp butter, 1 c diet soda  440  - 2 PM: walked ~50 min on TM at gym; had 8-12 oz water -  Snk ( PM)-  ---              560 D (7 PM)-  2 slc livermush, 3-4 tbsp cranberry sauce, 1/2 c butter beans, 1/2 slc high-fiber bread, 1/2 tsp butter, 3 pickle slices, 16 oz diet soda Snk (7:30)-  12 oz diet  soda, bourbon           100 Snk (10 PM)-  2 Ritz crackers,  1 oz Cheddar cheese         130 Typical day? Yes.   Eating yogurt 2-3 X wk.  Typical lunch is ham/turkey/burger or chx salad w/ 1 slc toast and/or veg.    Nutritional Diagnosis: No progress noted on NB-1.1 Food and nutrition-related knowledge deficit As related to BG management and nutrition needs.  As evidenced by weight loss of 6 pounds and increased A1c of 9.2 on 12/01/22.    Handouts given during visit include: After-Visit Summary (AVS) FBG record form  Barriers to learning/adherence to lifestyle change: Longstanding eating behaviors as well as poor appetite.   For behavioral goals and recommendations, see Patient Instructions.    Follow-up:  [facility] .

## 2023-02-25 ENCOUNTER — Ambulatory Visit: Payer: Medicare PPO | Admitting: Family Medicine

## 2023-02-25 VITALS — Ht 63.5 in | Wt 107.0 lb

## 2023-02-25 DIAGNOSIS — E119 Type 2 diabetes mellitus without complications: Secondary | ICD-10-CM

## 2023-02-25 NOTE — Patient Instructions (Addendum)
Continue to check fasting blood sugar (first thing in morning, before eating or drinking anything) at least 3 X wk, e.g., MWF.    My biggest nutritional concerns for you include:  - Better blood sugar control - Getting enough calcium, protein, and energy (calories)  Your weight is down 6 lb from September.  This means you have probably lost some muscle, which is not helpful to your bone health.    Your diet has not been providing adequate calcium.  The recommended amount of calcium for adults 71 years and older is 1200 mg/day.  I strongly encourage you to take a low-dose calcium supplement (<250 mg) at least once daily.  Studies have shown increased risk of cardiac events and mortality, probably related to the high blood calcium concentration that may lead to calcification of the blood vessels.  I recommend that you NOT take your calcium supplement at the same meal with a calcium-rich food like yogurt or milk or even a non-dairy milk (which are all calcium-fortified).  Refer to the handout provided again today for information on calcium supplements and food sources.  Unfortunately, they will be closing in mid-October.    To help you meet your protein needs, it may be helpful to sometimes use a protein drink.  I recommend using a protein powder mixed with milk (350 mg calcium per cup), which will allow you to use only half a dose of protein powder, while still getting a good amount of protein partly from the milk (8 grams/cup).  Using a protein drink (with milk as a base) provides a good amount of both calcium and protein, whereas most protein bars do not have a lot of calcium.    Specific Recommendations:  1. Obtain at least 20 grams of protein per meal.  This will be good for your bones, but also helps to provide nutritional balance, which will help keep your blood sugars in a desirable range.   1 egg or 1 ounce of meat, fish, poultry, or cheese provides about 7 grams of protein.  8 oz of milk or 2  tbsp peanut butter provides 8 grams of protein. 1 cup (8 oz) of most beans provides 15 grams of protein.  (Despite providing carbohydrate in addition to protein, beans have been shown to be beneficial to blood glucose management, so take advantage of these as a protein source.  I recommend getting the no salt added.) Yogurts vary greatly, but [place] yogurt is highest in protein.)  NOTE: The size of a deck of cards is equal to about 3-4 ounces of meat, fish, or poultry.  2. Include a serving of vegetables with lunch and dinner as often as you can (and continue to eat fresh fruit as desired).  This is good for your bones (as a source of minerals, calcium, magnesium, and potassium) and also helpful to blood sugar control b/c of fiber content.    3. Make sure you eat a FULL (real) meal 3 X day.   A REAL breakfast includes at least some protein and some starch (fruit optional).   A REAL lunch or dinner includes at least some protein, some starch, and vegetables and/or fruit.    As previously recommended, use of continuous glucose monitoring will really help you understand what affects your blood sugar levels.  Please schedule an appt with our PharmD for the Liberate Study in January.    I recommend that you follow up with [place] at [facility], [address] (4th floor) following my retirement.  They will call you to schedule an appt after receiving a physician referral.

## 2023-03-02 ENCOUNTER — Ambulatory Visit: Payer: Medicare PPO | Admitting: Nurse Practitioner

## 2023-03-22 NOTE — Progress Notes (Signed)
 FOLLOW UP   Assessment and Plan    Essential hypertension Discussed DASH (Dietary Approaches to Stop Hypertension) DASH diet is lower in sodium than a typical American diet. Cut back on foods that are high in saturated fat, cholesterol, and trans fats. Eat more whole-grain foods, fish, poultry, and nuts Remain active and exercise as tolerated daily.  Monitor BP at home-Call if greater than 130/80.  Check CMP/CBC  RBBB  Stable on recent EKG, no concerning sx, monitor   Type 2 diabetes mellitus with stage 3 chronic kidney disease, without long-term current use of insulin  (HCC) Continue Metformin , Jardiance  Education: Reviewed 'ABCs' of diabetes management  Discussed goals to be met and/or maintained include A1C (<7) Blood pressure (<130/80) Cholesterol (LDL <70) Continue Eye Exam yearly  Continue Dental Exam Q6 mo Discussed dietary recommendations Discussed Physical Activity recommendations Check A1C  Hyperlipidemia associated with T2DM (HCC) Discussed lifestyle modifications. Recommended diet heavy in fruits and veggies, omega 3's. Decrease consumption of animal meats, cheeses, and dairy products. Remain active and exercise as tolerated. Continue to monitor. Check lipids/TSH   CKD IIIa associated with T2DM (HCC) Discussed how what you eat and drink can aide in kidney protection. Stay well hydrated. Avoid high salt foods. Avoid NSAIDS. Keep BP and BG well controlled.   Take medications as prescribed. Remain active and exercise as tolerated daily. Maintain weight.  Continue to monitor. Check CMP/GFR/Microablumin  Hypothyroidism, unspecified type Controlled. Continue Levothyroxine . Reminded to take on an empty stomach 30-76mins before food.  Stop any Biotin Supplement 48-72 hours before next TSH level to reduce the risk of falsely low TSH levels. Continue to monitor.    Cervical radiculopathy at C6 s/p fusion, takes tramadol  PRN, mainly for travel 20 tabs  lasted over 12 months per PDMP review  Vitamin D  deficiency Continue supplementation Check vitamin D  level  Insomnia Continue Trazadone  Discussed good sleep hygiene. Establish bed and wake times. Sleep restriction-only sleep estimated hrs sleep. Bed only for sex and sleep, only sleep when sleepy, out of bed if anxious (stimulus control). Reviewed relaxation techniques, mindful meditations. Expected sleep duration. Addressed worries about not sleeping.    Medication management All medications discussed and reviewed in full. All questions and concerns regarding medications addressed.    Osteopenia DEXA Due - ordered Pursue a combination of weight-bearing exercises and strength training. Advised on fall prevention measures including proper lighting in all rooms, removal of area rugs and floor clutter, use of walking devices as deemed appropriate, avoidance of uneven walking surfaces. Smoking cessation and moderate alcohol consumption if applicable Consume 800 to 1000 IU of vitamin D  daily with a goal vitamin D  serum value of 30 ng/mL or higher. Aim for 1000 to 1200 mg of elemental calcium  daily through supplements and/or dietary sources.  Right hand arthritis  Continue tylenol , voltaren or aspercreme 3-4 times daily Plan hand specialist referral if worsening   B12 def Continue B12 supplement Defer recheck to next visit   BMi 20 Discussed appropriate BMI Diet modification. Physical activity. Encouraged/praised to build confidence.   Orders Placed This Encounter  Procedures   CBC with Differential/Platelet   COMPLETE METABOLIC PANEL WITH GFR   Lipid panel   Hemoglobin A1c   TSH   Notify office for further evaluation and treatment, questions or concerns if any reported s/s fail to improve.   The patient was advised to call back or seek an in-person evaluation if any symptoms worsen or if the condition fails to improve as anticipated.  Further disposition pending  results of labs. Discussed med's effects and SE's.    I discussed the assessment and treatment plan with the patient. The patient was provided an opportunity to ask questions and all were answered. The patient agreed with the plan and demonstrated an understanding of the instructions.  Discussed med's effects and SE's. Screening labs and tests as requested with regular follow-up as recommended.  I provided 30 minutes of face-to-face time during this encounter including counseling, chart review, and critical decision making was preformed.  Today's Plan of Care is based on a patient-centered health care approach known as shared decision making - the decisions, tests and treatments allow for patient preferences and values to be balanced with clinical evidence.    Future Appointments  Date Time Provider Department Center  03/25/2023  3:30 PM [facility] DX DEXA 1 [facility]  04/22/2023  1:30 PM Amalia Maude MATSU, RPH-CPP [facility]  05/17/2023  2:00 PM Laurice President, NP [facility] None  08/24/2023 10:30 AM Laurice President, NP [facility] None    Subjective:  Elizabeth May is a 77 y.o. female who presents for a 3 month follow up. She has Essential hypertension; Cervical radiculopathy at C6; Type 2 diabetes mellitus (HCC); Hypothyroidism; Hyperlipidemia associated with type 2 diabetes mellitus (HCC); Vitamin D  deficiency; Medication management; CKD stage 3 due to type 2 diabetes mellitus (HCC); BMI 20.0-20.9, adult; Insomnia; RBBB; Arthritis of right hand; Arthritis of left hand; B12 deficiency; and Osteopenia on their problem list.   Overall she reports doing well today.  She has no new or additional concerns at this time.  She is married, has 1 adopted daughter, first grandchild is now 26 months old, [facility].  She is taking trazodone  for insomnia occasionally if needed, takes when traveling.   She has hx of cervical fusion for radiculopathy at C6; she takes tramadol  PRN rarely for pain  while on long trips. Last filled 20 tabs 05/14/2020.   She is following with urology Dr. Rosina Riis for OAB, currently on myrbetriq  50 mg daily with benefit.    BMI is Body mass index is 18.83 kg/m., she has been working on diet and exercise Wt Readings from Last 3 Encounters:  03/23/23 108 lb (49 kg)  02/25/23 107 lb (48.5 kg)  12/21/22 104 lb 12.8 oz (47.5 kg)   She has had elevated blood pressure for years. Her blood pressure has been controlled at home, today their BP is BP: 118/62 She does workout. She denies chest pain, shortness of breath, dizziness.    She has diabetes type 2  with CKD IIIa - jardiance , ACEi With hyperlipidemia on lipitor 40 mg not at goal less than 70  with metformin  2000 mg daily, glipizide  10 mg TID and denies foot ulcerations, hyperglycemia, hypoglycemia , increased appetite, nausea, paresthesia of the feet, polydipsia, polyuria, visual disturbances, vomiting and weight loss.  She reports fasting ranges 120-130 or so but admits not checking daily  Last A1C in the office was:  Lab Results  Component Value Date   HGBA1C 9.2 (H) 12/01/2022   Lab Results  Component Value Date   CHOL 156 12/01/2022   HDL 64 12/01/2022   LDLCALC 75 12/01/2022   TRIG 87 12/01/2022   CHOLHDL 2.4 12/01/2022   She has CKD III associated with T2DM monitored at this office:  Lab Results  Component Value Date   EGFR 56 (L) 12/01/2022   Patient is on Vitamin D  supplement, takes 200 mg BID with calcium .  Lab Results  Component Value Date   VD25OH 50 12/01/2022     She is on thyroid medication. Her medication was not changed last visit. Taking 50 mcg daily.  Lab Results  Component Value Date   TSH 3.38 12/01/2022   She is not currenlty on B12 supplement Lab Results  Component Value Date   VITAMINB12 368 05/14/2020      Medication Review:  Current Outpatient Medications (Endocrine & Metabolic):    empagliflozin  (JARDIANCE ) 25 MG TABS tablet, START 1/2 TAB  DAILY FOR 1 MONTH THEN INCREASE TO 1 TAB DAILY. (Patient taking differently: Take one tablet daily)   glipiZIDE  (GLUCOTROL ) 10 MG tablet, TAKE 1/2-1 TABLET BY MOUTH 2  TIMES DAILY WITH MEALS. Skip this medication if low sugars, throwing up or not eating.   levothyroxine  (SYNTHROID ) 50 MCG tablet, TAKE 1 TABLET DAILY ON AN EMPTY STOMACH WITH ONLY WATER FOR 30 MINUTES   metFORMIN  (GLUCOPHAGE -XR) 500 MG 24 hr tablet, Take 2 tablets 2 x /day with Meals for Diabetes  Current Outpatient Medications (Cardiovascular):    amLODipine  (NORVASC ) 5 MG tablet, TAKE 1 TABLET EVERY DAY FOR BLOOD PRESSURE   atorvastatin  (LIPITOR) 40 MG tablet, Take 1 tablet Daily for Cholesterol   lisinopril -hydrochlorothiazide  (ZESTORETIC ) 10-12.5 MG tablet, Take 1 tablet by mouth daily.  Current Outpatient Medications (Respiratory):    promethazine -dextromethorphan (PROMETHAZINE -DM) 6.25-15 MG/5ML syrup, Take 5 mLs by mouth 4 (four) times daily as needed for cough.    Current Outpatient Medications (Other):    Biotin 10 MG TABS, Take by mouth daily. PRN   Calcium  Carbonate-Vitamin D  (CALCIUM -VITAMIN D ) 500-200 MG-UNIT per tablet, Take 1 tablet by mouth 2 (two) times daily with a meal.   CINNAMON PO, Take 1,000 Units by mouth daily. 2 capsules daily   mirabegron  ER (MYRBETRIQ ) 50 MG TB24 tablet, Take 1 tablet (50 mg total) by mouth daily.   traZODone  (DESYREL ) 150 MG tablet, TAKE 1/2 TO 1 TABLET AT BEDTIME AS NEEDED FOR SLEEP   triamcinolone  cream (KENALOG ) 0.5 %, Apply 1 Application topically 2 (two) times daily.   azithromycin  (ZITHROMAX ) 250 MG tablet, Take 2 tablets (500 mg) on  Day 1,  followed by 1 tablet (250 mg) once daily on Days 2 through 5.  Current Problems (verified) Patient Active Problem List   Diagnosis Date Noted   Osteopenia 11/04/2020   B12 deficiency 09/16/2020   Arthritis of left hand 05/16/2020   Arthritis of right hand 05/14/2020   RBBB 02/08/2018   Insomnia 10/27/2017   CKD stage 3 due to  type 2 diabetes mellitus (HCC) 06/27/2017   BMI 20.0-20.9, adult 06/27/2017   Medication management 03/08/2014   Hyperlipidemia associated with type 2 diabetes mellitus (HCC) 03/21/2013   Vitamin D  deficiency 03/21/2013   Type 2 diabetes mellitus (HCC) 03/17/2013   Hypothyroidism    Cervical radiculopathy at C6 10/22/2011   Essential hypertension 07/01/2009    Screening Tests Immunization History  Administered Date(s) Administered   Influenza Split 02/13/2013   Influenza, High Dose Seasonal PF 12/07/2014, 12/09/2015, 12/15/2016, 02/08/2018, 12/16/2018, 02/20/2021, 12/03/2021   Influenza-Unspecified 03/26/2014, 01/07/2023   PFIZER(Purple Top)SARS-COV-2 Vaccination 04/20/2019, 05/18/2019, 12/25/2019, 09/21/2020   Pneumococcal Conjugate-13 12/09/2015   Pneumococcal Polysaccharide-23 05/14/2021   Pneumococcal-Unspecified 03/23/2008   Tdap 07/17/2010, 12/03/2021   Zoster Recombinant(Shingrix) 12/16/2018, 04/06/2019   Zoster, Live 01/14/2012   Health Maintenance  Topic Date Due   FOOT EXAM  05/14/2022   OPHTHALMOLOGY EXAM  05/30/2022   DEXA SCAN  11/01/2022   COVID-19 Vaccine (5 -  2024-25 season) 11/22/2022   Diabetic kidney evaluation - Urine ACR  05/15/2023   HEMOGLOBIN A1C  05/31/2023   Medicare Annual Wellness (AWV)  08/18/2023   Diabetic kidney evaluation - eGFR measurement  12/01/2023   Fecal DNA (Cologuard)  10/18/2025   DTaP/Tdap/Td (3 - Td or Tdap) 12/04/2031   Pneumonia Vaccine 68+ Years old  Completed   INFLUENZA VACCINE  Completed   Hepatitis C Screening  Completed   Zoster Vaccines- Shingrix  Completed   HPV VACCINES  Aged Out   Patient Care Team: Tonita Fallow, MD as PCP - General (Internal Medicine) Verlin Lonni BIRCH, MD as Consulting Physician (Cardiology) Kristie Lamprey, MD as Consulting Physician (Gastroenterology) Beuford Anes, MD as Consulting Physician (Orthopedic Surgery) Rosalynn LELON Ingle, MD (Inactive) as Consulting Physician (Obstetrics and  Gynecology)  Allergies No Known Allergies  SURGICAL HISTORY She  has a past surgical history that includes Breast surgery (80's); Anterior cervical decomp/discectomy fusion (10/22/2011); and Breast excisional biopsy (Left). FAMILY HISTORY Her family history includes Alzheimer's disease in her father, paternal aunt, and paternal uncle; Diabetes in her maternal aunt, maternal grandmother, and maternal uncle; Drug abuse in her maternal grandmother; Heart disease in her mother; Hyperlipidemia in her mother; Hypertension in her mother. SOCIAL HISTORY She  reports that she has never smoked. She has never used smokeless tobacco. She reports current alcohol use of about 7.0 standard drinks of alcohol per week. She reports that she does not use drugs.  Review of Systems  Constitutional:  Negative for chills, fever, malaise/fatigue and weight loss.  HENT:  Negative for congestion, ear pain, hearing loss, nosebleeds, sore throat and tinnitus.   Eyes:  Negative for blurred vision and double vision.  Respiratory:  Negative for cough, shortness of breath and wheezing.   Cardiovascular:  Negative for chest pain, palpitations, orthopnea, claudication and leg swelling.  Gastrointestinal:  Negative for abdominal pain, blood in stool, constipation, diarrhea, heartburn, melena, nausea and vomiting.  Genitourinary: Negative.        Mild stress incontinence  Musculoskeletal:  Positive for neck pain (intermittent). Negative for joint pain and myalgias.  Skin:  Positive for itching (itchy lesions to legs). Negative for rash.  Neurological:  Negative for dizziness, tingling, sensory change, loss of consciousness, weakness and headaches.  Endo/Heme/Allergies:  Negative for polydipsia.  Psychiatric/Behavioral:  Negative for depression, memory loss and substance abuse. The patient is not nervous/anxious and does not have insomnia.   All other systems reviewed and are negative.    Objective:     Today's Vitals    03/23/23 1423  BP: 118/62  Pulse: (!) 52  Temp: 97.9 F (36.6 C)  SpO2: 98%  Weight: 108 lb (49 kg)  Height: 5' 3.5 (1.613 m)     Body mass index is 18.83 kg/m.  General appearance: alert, no distress, WD/WN, female HEENT: normocephalic, sclerae anicteric, TMs pearly, nares patent, no discharge or erythema, pharynx normal Oral cavity: Poor dentition; left lower jaw with teeth absent, erythematous gingiva with ? Scant purulent discharge; front teeth cracked/chipped; otherwise MM intact, uvula midline, sublingual glands non-inflamed Neck: supple, no lymphadenopathy, no thyromegaly, no masses Heart: RRR, normal S1, subtle spit S2, no murmurs Lungs: CTA bilaterally, no wheezes, rhonchi, or rales Abdomen: +bs, soft, non tender, non distended, no masses, no hepatomegaly, no splenomegaly Musculoskeletal: nontender, no swelling, no obvious deformity, symmetrical strength Extremities: no edema, no cyanosis, no clubbing Pulses: 2+ symmetric, upper and lower extremities, normal cap refill Neurological: alert, oriented x 3, CN2-12 intact, strength  normal upper extremities and lower extremities, sensation normal throughout, DTRs 2+ throughout, no cerebellar signs, gait normal. Sensation intact to monofilament bil feet.  Psychiatric: normal affect, behavior normal, pleasant  Skin: Warm/dry, intact, without, ecchymosis, rash; she has scattered lesions with central excoriation/scabbing, very faint surrounding erythema to bil lower legs.   BASCOM NECESSARY, NP   03/23/2023

## 2023-03-23 ENCOUNTER — Encounter: Payer: Self-pay | Admitting: Nurse Practitioner

## 2023-03-23 ENCOUNTER — Ambulatory Visit (INDEPENDENT_AMBULATORY_CARE_PROVIDER_SITE_OTHER): Payer: Medicare PPO | Admitting: Nurse Practitioner

## 2023-03-23 VITALS — BP 118/62 | HR 52 | Temp 97.9°F | Ht 63.5 in | Wt 108.0 lb

## 2023-03-23 DIAGNOSIS — I4510 Unspecified right bundle-branch block: Secondary | ICD-10-CM | POA: Diagnosis not present

## 2023-03-23 DIAGNOSIS — E1122 Type 2 diabetes mellitus with diabetic chronic kidney disease: Secondary | ICD-10-CM | POA: Diagnosis not present

## 2023-03-23 DIAGNOSIS — G4700 Insomnia, unspecified: Secondary | ICD-10-CM

## 2023-03-23 DIAGNOSIS — Z6820 Body mass index (BMI) 20.0-20.9, adult: Secondary | ICD-10-CM

## 2023-03-23 DIAGNOSIS — M5412 Radiculopathy, cervical region: Secondary | ICD-10-CM

## 2023-03-23 DIAGNOSIS — E039 Hypothyroidism, unspecified: Secondary | ICD-10-CM | POA: Diagnosis not present

## 2023-03-23 DIAGNOSIS — Z79899 Other long term (current) drug therapy: Secondary | ICD-10-CM

## 2023-03-23 DIAGNOSIS — N1831 Chronic kidney disease, stage 3a: Secondary | ICD-10-CM | POA: Diagnosis not present

## 2023-03-23 DIAGNOSIS — I10 Essential (primary) hypertension: Secondary | ICD-10-CM

## 2023-03-23 DIAGNOSIS — E559 Vitamin D deficiency, unspecified: Secondary | ICD-10-CM | POA: Diagnosis not present

## 2023-03-23 DIAGNOSIS — E1169 Type 2 diabetes mellitus with other specified complication: Secondary | ICD-10-CM | POA: Diagnosis not present

## 2023-03-23 DIAGNOSIS — N183 Chronic kidney disease, stage 3 unspecified: Secondary | ICD-10-CM

## 2023-03-23 DIAGNOSIS — M8580 Other specified disorders of bone density and structure, unspecified site: Secondary | ICD-10-CM

## 2023-03-23 DIAGNOSIS — E538 Deficiency of other specified B group vitamins: Secondary | ICD-10-CM

## 2023-03-23 DIAGNOSIS — E785 Hyperlipidemia, unspecified: Secondary | ICD-10-CM | POA: Diagnosis not present

## 2023-03-23 NOTE — Patient Instructions (Signed)

## 2023-03-24 LAB — COMPLETE METABOLIC PANEL WITH GFR
AG Ratio: 1.9 (calc) (ref 1.0–2.5)
ALT: 27 U/L (ref 6–29)
AST: 14 U/L (ref 10–35)
Albumin: 4.3 g/dL (ref 3.6–5.1)
Alkaline phosphatase (APISO): 82 U/L (ref 37–153)
BUN/Creatinine Ratio: 24 (calc) — ABNORMAL HIGH (ref 6–22)
BUN: 28 mg/dL — ABNORMAL HIGH (ref 7–25)
CO2: 24 mmol/L (ref 20–32)
Calcium: 9.5 mg/dL (ref 8.6–10.4)
Chloride: 102 mmol/L (ref 98–110)
Creat: 1.19 mg/dL — ABNORMAL HIGH (ref 0.60–1.00)
Globulin: 2.3 g/dL (ref 1.9–3.7)
Glucose, Bld: 330 mg/dL — ABNORMAL HIGH (ref 65–99)
Potassium: 4.3 mmol/L (ref 3.5–5.3)
Sodium: 137 mmol/L (ref 135–146)
Total Bilirubin: 0.4 mg/dL (ref 0.2–1.2)
Total Protein: 6.6 g/dL (ref 6.1–8.1)
eGFR: 48 mL/min/{1.73_m2} — ABNORMAL LOW (ref >=60)

## 2023-03-24 LAB — CBC WITH DIFFERENTIAL/PLATELET
Absolute Lymphocytes: 1811 {cells}/uL (ref 850–3900)
Absolute Monocytes: 362 {cells}/uL (ref 200–950)
Basophils Absolute: 50 {cells}/uL (ref 0–200)
Basophils Relative: 0.7 %
Eosinophils Absolute: 71 {cells}/uL (ref 15–500)
Eosinophils Relative: 1 %
HCT: 44.4 % (ref 35.0–45.0)
Hemoglobin: 14.4 g/dL (ref 11.7–15.5)
MCH: 29.1 pg (ref 27.0–33.0)
MCHC: 32.4 g/dL (ref 32.0–36.0)
MCV: 89.9 fL (ref 80.0–100.0)
MPV: 11 fL (ref 7.5–12.5)
Monocytes Relative: 5.1 %
Neutro Abs: 4807 {cells}/uL (ref 1500–7800)
Neutrophils Relative %: 67.7 %
Platelets: 336 10*3/uL (ref 140–400)
RBC: 4.94 10*6/uL (ref 3.80–5.10)
RDW: 13.1 % (ref 11.0–15.0)
Total Lymphocyte: 25.5 %
WBC: 7.1 10*3/uL (ref 3.8–10.8)

## 2023-03-24 LAB — TSH: TSH: 2.5 m[IU]/L (ref 0.40–4.50)

## 2023-03-24 LAB — LIPID PANEL
Cholesterol: 141 mg/dL (ref <200)
HDL: 59 mg/dL (ref >=50)
LDL Cholesterol (Calc): 64 mg/dL
Non-HDL Cholesterol (Calc): 82 mg/dL (ref <130)
Total CHOL/HDL Ratio: 2.4 (calc) (ref <5.0)
Triglycerides: 96 mg/dL (ref <150)

## 2023-03-24 LAB — HEMOGLOBIN A1C
Hgb A1c MFr Bld: 10.2 %{Hb} — ABNORMAL HIGH (ref <5.7)
Mean Plasma Glucose: 246 mg/dL
eAG (mmol/L): 13.6 mmol/L

## 2023-03-25 ENCOUNTER — Other Ambulatory Visit: Payer: Self-pay | Admitting: Nurse Practitioner

## 2023-03-25 ENCOUNTER — Inpatient Hospital Stay: Admission: RE | Admit: 2023-03-25 | Payer: Medicare PPO | Source: Ambulatory Visit

## 2023-03-25 DIAGNOSIS — M8580 Other specified disorders of bone density and structure, unspecified site: Secondary | ICD-10-CM

## 2023-04-01 ENCOUNTER — Encounter: Payer: Self-pay | Admitting: Pharmacist

## 2023-04-01 ENCOUNTER — Ambulatory Visit (INDEPENDENT_AMBULATORY_CARE_PROVIDER_SITE_OTHER): Payer: Medicare PPO | Admitting: Pharmacist

## 2023-04-01 VITALS — BP 126/59 | HR 57 | Wt 113.0 lb

## 2023-04-01 DIAGNOSIS — N1831 Chronic kidney disease, stage 3a: Secondary | ICD-10-CM

## 2023-04-01 DIAGNOSIS — E1122 Type 2 diabetes mellitus with diabetic chronic kidney disease: Secondary | ICD-10-CM | POA: Diagnosis not present

## 2023-04-01 NOTE — Progress Notes (Signed)
    S:     Chief Complaint  Patient presents with   Medication Management    Diabetes - Liberate CGM Study   78 y.o. female who presents for diabetes evaluation, education, and management. Patient arrives in good spirits and presents without any assistance. Patient is accompanied by her husband, [name]. Referred for interest in Liberate CGM Study.   Majority of this visit was spent with discussing and initiating CGM -  Dexcom G7 with receiver.   Patient was referred and last seen by Primary Care Provider, Dr. Wonda, RD on 02/25/2023 for possible enrollment in CGM study.   Last saw Lonell Breslow, NP.  PMH is significant for Type 2 diabetes.    Current diabetes medications include: Jardiance  (empagliflozin ) 25 mg daily, Glucotrol  (glipizide ) 10 mg daily, and Glucophage  XR (metformin ) 1000 mg (500 mg tablets) twice daily  Patient reports adherence to taking all medications as prescribed.    O:   Review of Systems  All other systems reviewed and are negative.   Physical Exam Vitals reviewed.  Constitutional:      Appearance: Normal appearance.  Pulmonary:     Effort: Pulmonary effort is normal.  Neurological:     Mental Status: She is alert. Mental status is at baseline.  Psychiatric:        Mood and Affect: Mood normal.        Thought Content: Thought content normal.     Lab Results  Component Value Date   HGBA1C 10.2 (H) 03/23/2023   Vitals:   04/01/23 1007  BP: (!) 126/59  Pulse: (!) 57  SpO2: 100%    Lipid Panel     Component Value Date/Time   CHOL 141 03/23/2023 1527   TRIG 96 03/23/2023 1527   HDL 59 03/23/2023 1527   CHOLHDL 2.4 03/23/2023 1527   VLDL 28 07/30/2016 1606   LDLCALC 64 03/23/2023 1527     A/P: Diabetes with A1C control > 8 for > 12 months.  Referred for Liberate CGM  study for possible enrollment. Unfortunately, phone was unable to [employer] APP.  Currently on Jardiance  (empagliflozin ), Glipizide  and metformin  XR. Medication  adherence appears good. Control is suboptimal due to traveling. Apprehensive about starting CGM, but with education on benefits patient came around. Not a candidate for Liberate study due to iphone incompatibility.  -Start DEXCOM G7 CGM -Continued SGLT2-I Jardiance  (empagliflozin ) 25 mg daily.  -Continue metformin  SR 1000 mg twice daily.  -Continue glipizide  10 mg twice daily -Patient educated on purpose and proper use of DEXCOM G7 CGM.   Written patient instructions provided. Patient verbalized understanding of treatment plan.  Total time in face to face counseling 43 minutes.    Follow-up:  Pharmacist 05/05/2023 Patient seen with Owens Cowing, PharmD Candidate and Madelaine Darnel, PharmD Candidate.

## 2023-04-01 NOTE — Progress Notes (Signed)
 Reviewed and agree with Dr Macky Lower plan.

## 2023-04-01 NOTE — Patient Instructions (Signed)
 It was a pleasure seeing you today! Enjoy your new DEXCOM G7!   Please remember the following about your [facility] G7 Sensor 1. Sensor will last 10 days 2. Transmitter must be within 20 feet of receiver/cell phone. 3. If using Dexcom G7 app on cell phone, please remember to keep app open (do not close out of app). 4. Do a fingerstick blood glucose test if the sensor readings do not match how you feel 5. Remove sensor prior to magnetic resonance imaging (MRI), computed tomography (CT) scan, or high-frequency electrical heat  diathermy) treatment. 6. Do not allow sun screen or insect repellant to come into contact with Dexcom G7. These skin care products may lead for the plastic used in the Dexcom G7 to crack. 7. Dexcom G7 may be worn through a industrial/product designer. It may not be exposed to an advanced Imaging Technology (AIT) body scanner (also called a millimeter wave scanner) or the baggage x-ray machine. Instead, ask for hand-wanding or full-body pat-down and visual inspection.  8. Doses of acetaminophen  (Tylenol ) >1 gram every 6 hours may cause false high readings. 9. Store sensor kit between 36 and 86 degrees Farenheit. Can be refrigerated within this temperature range.   Hypoglycemia or low blood sugar:   Low blood sugar can happen quickly and may become an emergency if not treated right away.   While this shouldn't happen often, it can be brought upon if you skip a meal or do not eat enough. Also, if your insulin  or other diabetes medications are dosed too high, this can cause your blood sugar to go to low.   Warning signs of low blood sugar include: Feeling shaky or dizzy Feeling weak or tired  Excessive hunger Feeling anxious or upset  Sweating even when you aren't exercising  What to do if I experience low blood sugar? Follow the Rule of 15 Check your blood sugar. If lower than 70, proceed to step 2.  Treat with 15 grams of fast acting carbs which is found in 3-4 glucose  tablets. If none are available you can try hard candy, 1 tablespoon of sugar or honey,4 ounces of fruit juice, or 6 ounces of REGULAR soda.  Re-check your sugar in 15 minutes. If it is still below 70, do what you did in step 2 again. If your blood sugar has come back up, go ahead and eat a snack or small meal made up of complex carbs (ex. Whole grains) and protein at this time to avoid recurrence of low blood sugar.  Ordering Overlay Patches 1. Go to the following website every 30 days to order new overlay patches:  [email or web address].[email or web address].co.uk  Problems with Dexcom sticking? 1. Order Skin Tac from amaz[employer]. Alcohol swab area you plan to administer Dexcom then let dry. Once dry, apply Skin Tac in a circular motion (with a spot in the middle for sensor without skin tac) and let dry. Once dry you can apply Dexcom!  Dexcom Customer Service Information Customer Sales Support (dexcom orders and general customer questions) Phone number: [phone] Monday - Friday  6 AM - 5 PM PST Saturday 8 AM - 4 PM PST  *Contact if you do not receive overlay patches  2. Global Technical Support (product troubleshooting or replacement inquiries) Phone number: [phone] Available 24 hours a day; 7 days a week  *Contact if you have a bad sensor. Remember to tell them you are wearing Dexcom on your stomach!  3. Dexcom Care (provides dexcom CGM training, software downloads, and  tutorials) Phone number: [phone] Monday - Friday 6 AM - 5 PM PST Saturday 7 AM - 1:30 PM PST (All hours subject to change)  4. Website: https://www.dexcom.com/  Please do the following:  If you have any questions or if you believe something has occurred because of this change, call me or your doctor to let one of us  know.  Continue making the lifestyle changes we've discussed together during our visit. Diet and exercise play a significant role in improving your blood sugars.

## 2023-04-01 NOTE — Assessment & Plan Note (Signed)
 Diabetes with A1C control > 8 for > 12 months.  Referred for Liberate CGM  study for possible enrollment. Unfortunately, phone was unable to [employer] APP.  Currently on Jardiance  (empagliflozin ), Glipizide  and metformin  XR. Medication adherence appears good. Control is suboptimal due to traveling. Apprehensive about starting CGM, but with education on benefits patient came around. Not a candidate for Liberate study due to iphone incompatibility.  -Start DEXCOM G7 CGM -Continued SGLT2-I Jardiance  (empagliflozin ) 25 mg daily.  -Continue metformin  SR 1000 mg twice daily.  -Continue glipizide  10 mg twice daily -Patient educated on purpose and proper use of DEXCOM G7 CGM.

## 2023-04-12 ENCOUNTER — Other Ambulatory Visit: Payer: Self-pay | Admitting: Nurse Practitioner

## 2023-04-12 DIAGNOSIS — N183 Chronic kidney disease, stage 3 unspecified: Secondary | ICD-10-CM

## 2023-04-12 DIAGNOSIS — E782 Mixed hyperlipidemia: Secondary | ICD-10-CM

## 2023-04-12 DIAGNOSIS — E1122 Type 2 diabetes mellitus with diabetic chronic kidney disease: Secondary | ICD-10-CM

## 2023-04-12 DIAGNOSIS — I10 Essential (primary) hypertension: Secondary | ICD-10-CM

## 2023-04-16 ENCOUNTER — Telehealth: Payer: Self-pay

## 2023-04-16 MED ORDER — DEXCOM G7 RECEIVER DEVI: 1.0000 | 0 refills | Status: DC

## 2023-04-16 NOTE — Telephone Encounter (Signed)
I sent a new receiver in their pharmacy. Please let patient know.  Terisa Starr, MD  Family Medicine Teaching Service

## 2023-04-16 NOTE — Telephone Encounter (Signed)
Patient's husband calls nurse line regarding Armed forces logistics/support/administrative officer. He reports that wife has misplaced this and has been unable to find. He is asking if our office may have a sample replacement that she could have or if he will need to purchase one out of pocket.   He reports that they are leaving for vacation tomorrow morning.   Advised that I would send message to Dr. Raymondo Band regarding this matter.   Requesting returned call at [phone].  Veronda Prude, RN

## 2023-04-16 NOTE — Telephone Encounter (Signed)
Patients husband calls nurse line in regards to [facility] G7 Receiver.   I advised him Dr. Raymondo Band was out of the office.   Advised we could attempt to send one to patients pharmacy.   Will forward to PCP.   [facility].

## 2023-04-19 NOTE — Telephone Encounter (Signed)
Pateint informed. Penni Bombard CMA

## 2023-04-22 ENCOUNTER — Ambulatory Visit: Payer: Medicare PPO | Admitting: Pharmacist

## 2023-05-05 ENCOUNTER — Encounter: Payer: Self-pay | Admitting: Pharmacist

## 2023-05-05 ENCOUNTER — Ambulatory Visit: Payer: Medicare PPO | Admitting: Pharmacist

## 2023-05-05 VITALS — Wt 108.0 lb

## 2023-05-05 DIAGNOSIS — N1831 Chronic kidney disease, stage 3a: Secondary | ICD-10-CM | POA: Diagnosis not present

## 2023-05-05 DIAGNOSIS — E1122 Type 2 diabetes mellitus with diabetic chronic kidney disease: Secondary | ICD-10-CM

## 2023-05-05 NOTE — Patient Instructions (Signed)
It was nice to see you today!  Your Dexcom Report is doing much better over the last few days.  Keep up the great work on your dietary adjustments as they have improved your glucose readings significantly.   I was great working with you.  Return at any time.  I hope you can find a new provider in the near future.   Continue all other medication the same.   Keep up the good work with diet and exercise. Aim for a diet full of vegetables, fruit and lean meats (chicken, [place], fish). Try to limit salt intake by eating fresh or frozen vegetables (instead of canned), rinse canned vegetables prior to cooking and do not add any additional salt to meals.

## 2023-05-05 NOTE — Progress Notes (Signed)
    S:     Chief Complaint  Patient presents with   Medication Management    Diabetes - CGM Review   78 y.o. female who presents for diabetes evaluation, education, and management. Patient arrives in good spirits and presents without any assistance. Patient is accompanied by her husband, [name].   Majority of this visit was spent with discussing and reviewing results of CGM -  Dexcom G7 with receiver.    Patient was referred and last seen by Primary Care Provider, Dr. Gerilyn Pilgrim, RD on 02/25/2023.   PMH is significant for Type 2 Diabetes.  At last visit, Dexcom CGM - G7 with receiver was initiated.   Current diabetes medications include: Jardiance (empagliflozin) 25 mg daily, Glucotrol (glipizide) 10 mg daily, and Glucophage XR (metformin) 1000 mg (500 mg tablets) twice daily   Patient reports adherence to taking all medications as prescribed.   Patient denies hypoglycemic events.   O:   Review of Systems  All other systems reviewed and are negative.   Physical Exam Constitutional:      Appearance: Normal appearance.  Pulmonary:     Effort: Pulmonary effort is normal.  Neurological:     Mental Status: She is alert. Mental status is at baseline.  Psychiatric:        Mood and Affect: Mood normal.        Behavior: Behavior normal.        Thought Content: Thought content normal.        Judgment: Judgment normal.     Libre3 CGM Download today 05/05/2023 % Time CGM is active: 98% Average Glucose: 188 mg/dL Glucose Management Indicator: 7.8  Glucose Variability: 32% (goal <36%) Time in Goal:  - Time in range 70-180: 51% (higher - week #2) - Time above range: 49% (37% 180-250) - Time below range: 0% Observed patterns:   Lab Results  Component Value Date   HGBA1C 10.2 (H) 03/23/2023   A/P: Diabetes with A1C control > 8 for > 12 months who utilized Dexcom CGM for 2 weeks.  During week #2 glucose values improved significantly.  Currently on Jardiance (empagliflozin),  Glipizide and metformin XR. Medication adherence appears good. Control is improved with focus on limiting portion sizes of carbohydrate. Appeared happy with results.    -Continue DEXCOM G7 CGM with additional 1 month supply. -Continued SGLT2-I Jardiance (empagliflozin) 25 mg daily.  -Continue metformin SR 1000 mg twice daily.  -Continue glipizide 10 mg twice daily. -Patient educated on purpose and proper use of DEXCOM G7 CGM and dietary adjustments.   Written patient instructions provided. Patient verbalized understanding of treatment plan.  Total time in face to face counseling 27 minutes.    Follow-up:  Pharmacist None planned - continue to learn from use and consider use to help with maintaining glucose control.  PCP clinic visit PRN - recently her PCP, Dr. Oneta Rack, passed away and she was encouraged to consider a provider here at [facility].

## 2023-05-05 NOTE — Assessment & Plan Note (Signed)
Diabetes with A1C control > 8 for > 12 months who utilized Dexcom CGM for 2 weeks.  During week #2 glucose values improved significantly.  Currently on Jardiance (empagliflozin), Glipizide and metformin XR. Medication adherence appears good. Control is improved with focus on limiting portion sizes of carbohydrate. Appeared happy with results.    -Continue DEXCOM G7 CGM with additional 1 month supply. -Continued SGLT2-I Jardiance (empagliflozin) 25 mg daily.  -Continue metformin SR 1000 mg twice daily.  -Continue glipizide 10 mg twice daily. -Patient educated on purpose and proper use of DEXCOM G7 CGM and dietary adjustments.

## 2023-05-05 NOTE — Progress Notes (Signed)
Reviewed and agree with Dr Macky Lower plan.

## 2023-05-17 ENCOUNTER — Encounter: Payer: Medicare PPO | Admitting: Nurse Practitioner

## 2023-05-19 ENCOUNTER — Other Ambulatory Visit: Payer: Self-pay | Admitting: Pharmacist

## 2023-05-19 DIAGNOSIS — M8580 Other specified disorders of bone density and structure, unspecified site: Secondary | ICD-10-CM

## 2023-06-23 ENCOUNTER — Encounter: Payer: Medicare PPO | Admitting: Nurse Practitioner

## 2023-08-18 ENCOUNTER — Ambulatory Visit: Admitting: Family Medicine

## 2023-08-18 ENCOUNTER — Encounter: Payer: Self-pay | Admitting: Family Medicine

## 2023-08-18 ENCOUNTER — Ambulatory Visit: Payer: Self-pay | Admitting: Family Medicine

## 2023-08-18 VITALS — BP 124/76 | HR 62 | Temp 97.8°F | Ht 63.5 in | Wt 105.0 lb

## 2023-08-18 DIAGNOSIS — E559 Vitamin D deficiency, unspecified: Secondary | ICD-10-CM

## 2023-08-18 DIAGNOSIS — E538 Deficiency of other specified B group vitamins: Secondary | ICD-10-CM

## 2023-08-18 DIAGNOSIS — R9431 Abnormal electrocardiogram [ECG] [EKG]: Secondary | ICD-10-CM

## 2023-08-18 DIAGNOSIS — G4700 Insomnia, unspecified: Secondary | ICD-10-CM | POA: Diagnosis not present

## 2023-08-18 DIAGNOSIS — E1165 Type 2 diabetes mellitus with hyperglycemia: Secondary | ICD-10-CM

## 2023-08-18 DIAGNOSIS — I10 Essential (primary) hypertension: Secondary | ICD-10-CM | POA: Diagnosis not present

## 2023-08-18 DIAGNOSIS — E1122 Type 2 diabetes mellitus with diabetic chronic kidney disease: Secondary | ICD-10-CM | POA: Diagnosis not present

## 2023-08-18 DIAGNOSIS — N183 Chronic kidney disease, stage 3 unspecified: Secondary | ICD-10-CM

## 2023-08-18 DIAGNOSIS — E785 Hyperlipidemia, unspecified: Secondary | ICD-10-CM

## 2023-08-18 DIAGNOSIS — E039 Hypothyroidism, unspecified: Secondary | ICD-10-CM | POA: Diagnosis not present

## 2023-08-18 DIAGNOSIS — E1169 Type 2 diabetes mellitus with other specified complication: Secondary | ICD-10-CM

## 2023-08-18 LAB — VITAMIN D 25 HYDROXY (VIT D DEFICIENCY, FRACTURES): VITD: 47.8 ng/mL (ref 30.00–100.00)

## 2023-08-18 LAB — COMPREHENSIVE METABOLIC PANEL WITH GFR
ALT: 27 U/L (ref 0–35)
AST: 27 U/L (ref 0–37)
Albumin: 4.4 g/dL (ref 3.5–5.2)
Alkaline Phosphatase: 63 U/L (ref 39–117)
BUN: 32 mg/dL — ABNORMAL HIGH (ref 6–23)
CO2: 27 meq/L (ref 19–32)
Calcium: 9.3 mg/dL (ref 8.4–10.5)
Chloride: 102 meq/L (ref 96–112)
Creatinine, Ser: 1.07 mg/dL (ref 0.40–1.20)
GFR: 50.54 mL/min — ABNORMAL LOW (ref >60.00)
Glucose, Bld: 130 mg/dL — ABNORMAL HIGH (ref 70–99)
Potassium: 4.1 meq/L (ref 3.5–5.1)
Sodium: 138 meq/L (ref 135–145)
Total Bilirubin: 0.5 mg/dL (ref 0.2–1.2)
Total Protein: 6.8 g/dL (ref 6.0–8.3)

## 2023-08-18 LAB — LIPID PANEL
Cholesterol: 109 mg/dL (ref 0–200)
HDL: 51.5 mg/dL (ref >39.00)
LDL Cholesterol: 39 mg/dL (ref 0–99)
NonHDL: 57.88
Total CHOL/HDL Ratio: 2
Triglycerides: 93 mg/dL (ref 0.0–149.0)
VLDL: 18.6 mg/dL (ref 0.0–40.0)

## 2023-08-18 LAB — HEMOGLOBIN A1C: Hgb A1c MFr Bld: 9.1 % — ABNORMAL HIGH (ref 4.6–6.5)

## 2023-08-18 LAB — CBC WITH DIFFERENTIAL/PLATELET
Basophils Absolute: 0.1 10*3/uL (ref 0.0–0.1)
Basophils Relative: 0.9 % (ref 0.0–3.0)
Eosinophils Absolute: 0.1 10*3/uL (ref 0.0–0.7)
Eosinophils Relative: 0.8 % (ref 0.0–5.0)
HCT: 44 % (ref 36.0–46.0)
Hemoglobin: 14.5 g/dL (ref 12.0–15.0)
Lymphocytes Relative: 21 % (ref 12.0–46.0)
Lymphs Abs: 1.7 10*3/uL (ref 0.7–4.0)
MCHC: 32.8 g/dL (ref 30.0–36.0)
MCV: 87.6 fl (ref 78.0–100.0)
Monocytes Absolute: 0.4 10*3/uL (ref 0.1–1.0)
Monocytes Relative: 4.5 % (ref 3.0–12.0)
Neutro Abs: 6 10*3/uL (ref 1.4–7.7)
Neutrophils Relative %: 72.8 % (ref 43.0–77.0)
Platelets: 314 10*3/uL (ref 150.0–400.0)
RBC: 5.03 Mil/uL (ref 3.87–5.11)
RDW: 13.6 % (ref 11.5–15.5)
WBC: 8.2 10*3/uL (ref 4.0–10.5)

## 2023-08-18 LAB — VITAMIN B12: Vitamin B-12: 341 pg/mL (ref 211–911)

## 2023-08-18 LAB — MICROALBUMIN / CREATININE URINE RATIO
Creatinine,U: 56.3 mg/dL
Microalb Creat Ratio: UNDETERMINED mg/g (ref 0.0–30.0)
Microalb, Ur: <0.7 mg/dL

## 2023-08-18 LAB — TSH: TSH: 2.89 u[IU]/mL (ref 0.35–5.50)

## 2023-08-18 NOTE — Progress Notes (Signed)
 Her A1c is still elevated at 9.1%.  Her diabetes is not well-controlled.  Please reach out to her. She is supposed to provide a list of the medications she has been taking consistently. She should restart Jardiance .  Okay to refill medications for 90 days except Myrbetriq  which was stopped. She is also supposed to let us  know which pharmacy she would like her medication refills sent to.  I am placing an referral to our pharmacist, Kathaleen Pale, so please let her know that she will be getting a call to schedule with her.

## 2023-08-18 NOTE — Patient Instructions (Signed)
 Thank you for trusting us  with your health care.  Please go downstairs for labs and a urine test before you leave.  Let us  know which medications you are taking and need refills on.  Also, let us  know which pharmacy you would like for us  to send them to.  You should stop taking Myrbetriq  due to your history of a prolonged QT on EKG.  This could lead to a heart arrhythmia that could be life-threatening.  Follow-up fasting (nothing to eat or drink except water for at least 8 hours) in 4-6 weeks.

## 2023-08-18 NOTE — Progress Notes (Signed)
 New Patient Office Visit  Subjective    Patient ID: Elizabeth May, female    DOB: 1945/07/07  Age: 78 y.o. MRN: [id number]  CC:  Chief Complaint  Patient presents with   Establish Care    Prescription refills    HPI [name] presents to establish care Previous PCP: Dr. Cassondra Cliff   DM- last A1c >10% and uncontrolled diabetes for the past 1-2 years. She is not sure which medications she is taking and her Jardiance  has not been refilled since 2024.   She has a Dexcom but she does not know how to check it. States her husband does it for her but she does not know what the readings have been.   Prescribed medications are Jardiance  (empagliflozin ) 25 mg daily, Glucotrol  (glipizide ) 10 mg daily, and Glucophage  XR (metformin ) 1000 mg (500 mg tablets) twice daily   States she does not want to take insulin  and she is opposed to injections   Reports taking statin daily.   States she takes trazodone  some nights.    Married   Research officer, political party daily  Retired Runner, broadcasting/film/video     Outpatient Encounter Medications as of 08/18/2023  Medication Sig   amLODipine  (NORVASC ) 5 MG tablet TAKE 1 TABLET EVERY DAY FOR BLOOD PRESSURE   atorvastatin  (LIPITOR) 40 MG tablet TAKE 1 TABLET EVERY DAY FOR CHOLESTEROL   Biotin 10 MG TABS Take by mouth daily. PRN   Calcium  Carbonate-Vitamin D  (CALCIUM -VITAMIN D ) 500-200 MG-UNIT per tablet Take 1 tablet by mouth 2 (two) times daily with a meal.   CINNAMON PO Take 1 capsule by mouth 2 (two) times daily. 1000 units   Continuous Glucose Receiver (DEXCOM G7 RECEIVER) DEVI 1 each by Does not apply route continuous.   empagliflozin  (JARDIANCE ) 25 MG TABS tablet START 1/2 TAB DAILY FOR 1 MONTH THEN INCREASE TO 1 TAB DAILY. (Patient taking differently: Take one tablet daily)   glipiZIDE  (GLUCOTROL ) 10 MG tablet TAKE 1/2 TO 1 TABLET TWICE DAILY WITH MEALS. SKIP THIS MEDICATION IF LOW SUGARS, THROWING UP OR NOT EATING.   levothyroxine  (SYNTHROID ) 50 MCG tablet TAKE 1 TABLET  DAILY ON AN EMPTY STOMACH WITH ONLY WATER FOR 30 MINUTES   lisinopril -hydrochlorothiazide  (ZESTORETIC ) 10-12.5 MG tablet TAKE 1 TABLET EVERY DAY   metFORMIN  (GLUCOPHAGE -XR) 500 MG 24 hr tablet TAKE 2 TABLETS TWICE DAILY WITH MEALS FOR DIABETES   traZODone  (DESYREL ) 150 MG tablet TAKE 1/2 TO 1 TABLET AT BEDTIME AS NEEDED FOR SLEEP   triamcinolone  cream (KENALOG ) 0.5 % Apply 1 Application topically 2 (two) times daily.   [DISCONTINUED] mirabegron  ER (MYRBETRIQ ) 50 MG TB24 tablet TAKE 1 TABLET EVERY DAY   [DISCONTINUED] promethazine -dextromethorphan (PROMETHAZINE -DM) 6.25-15 MG/5ML syrup Take 5 mLs by mouth 4 (four) times daily as needed for cough. (Patient not taking: Reported on 04/01/2023)   No facility-administered encounter medications on file as of 08/18/2023.    Past Medical History:  Diagnosis Date   Diabetes mellitus    Headache(784.0)    hx migraines   Hyperlipidemia    Hypertension    Hypothyroidism     Past Surgical History:  Procedure Laterality Date   ANTERIOR CERVICAL DECOMP/DISCECTOMY FUSION  10/22/2011   Procedure: ANTERIOR CERVICAL DECOMPRESSION/DISCECTOMY FUSION 1 LEVEL;  Surgeon: Estevan Helper, MD;  Location: [facility] OR;  Service: Orthopedics;  Laterality: Right;  Anterior cervical decompression fusion, cervical 5-6 with instrumentation and allograft.   BREAST EXCISIONAL BIOPSY Left    30 years ago - benign   BREAST SURGERY  80's  lft cyst    Family History  Problem Relation Age of Onset   Hypertension Mother    Hyperlipidemia Mother    Heart disease Mother    Alzheimer's disease Father    Drug abuse Maternal Grandmother    Diabetes Maternal Grandmother    Diabetes Maternal Aunt    Diabetes Maternal Uncle    Alzheimer's disease Paternal Uncle    Alzheimer's disease Paternal Aunt     Social History   Socioeconomic History   Marital status: Married    Spouse name: Not on file   Number of children: 1   Years of education: Not on file   Highest  education level: Not on file  Occupational History   Not on file  Tobacco Use   Smoking status: Never   Smokeless tobacco: Never  Vaping Use   Vaping status: Never Used  Substance and Sexual Activity   Alcohol use: Yes    Alcohol/week: 7.0 standard drinks of alcohol    Types: 7 Glasses of wine per week   Drug use: No   Sexual activity: Yes    Partners: Male    Birth control/protection: Post-menopausal  Other Topics Concern   Not on file  Social History Narrative   Not on file   Social Drivers of Health   Financial Resource Strain: Not on file  Food Insecurity: Not on file  Transportation Needs: Not on file  Physical Activity: Sufficiently Active (02/08/2018)   Exercise Vital Sign    Days of Exercise per Week: 5 days    Minutes of Exercise per Session: 50 min  Stress: No Stress Concern Present (02/08/2018)   [employer] of Occupational Health - Occupational Stress Questionnaire    Feeling of Stress : Only a little  Social Connections: Not on file  Intimate Partner Violence: Not on file    Review of Systems  Constitutional:  Negative for chills, fever, malaise/fatigue and weight loss.  Eyes:  Negative for blurred vision, double vision and photophobia.  Respiratory:  Negative for shortness of breath.   Cardiovascular:  Negative for chest pain, palpitations and leg swelling.  Gastrointestinal:  Negative for abdominal pain, constipation, diarrhea, nausea and vomiting.  Genitourinary:  Negative for dysuria, frequency and urgency.  Musculoskeletal:  Negative for falls, joint pain and myalgias.  Neurological:  Negative for dizziness, focal weakness and headaches.  Psychiatric/Behavioral:  Negative for depression. The patient is not nervous/anxious.         Objective    BP 124/76 (BP Location: Left Arm, Patient Position: Sitting)   Pulse 62   Temp 97.8 F (36.6 C) (Temporal)   Ht 5' 3.5" (1.613 m)   Wt 105 lb (47.6 kg)   SpO2 99%   BMI 18.31 kg/m    Physical Exam Constitutional:      General: She is not in acute distress.    Appearance: She is not ill-appearing.  Eyes:     Extraocular Movements: Extraocular movements intact.     Conjunctiva/sclera: Conjunctivae normal.  Cardiovascular:     Rate and Rhythm: Normal rate.  Pulmonary:     Effort: Pulmonary effort is normal.  Musculoskeletal:     Cervical back: Normal range of motion and neck supple.     Right lower leg: No edema.     Left lower leg: No edema.  Skin:    General: Skin is warm and dry.  Neurological:     General: No focal deficit present.     Mental Status: She is  alert and oriented to person, place, and time.     Motor: No weakness.     Coordination: Coordination normal.     Gait: Gait normal.  Psychiatric:        Mood and Affect: Mood normal.        Behavior: Behavior normal.        Thought Content: Thought content normal.         Assessment & Plan:   Problem List Items Addressed This Visit     B12 deficiency   Relevant Orders   Vitamin B12 (Completed)   CKD stage 3 due to type 2 diabetes mellitus (HCC)   Relevant Orders   Comprehensive metabolic panel with GFR (Completed)   Amb Referral to Clinical Pharmacist   Essential hypertension   Relevant Orders   CBC with Differential/Platelet (Completed)   Comprehensive metabolic panel with GFR (Completed)   Amb Referral to Clinical Pharmacist   Hyperlipidemia associated with type 2 diabetes mellitus (HCC)   Relevant Orders   Lipid panel (Completed)   Hypothyroidism   Relevant Orders   TSH (Completed)   Insomnia   Vitamin D  deficiency   Relevant Orders   VITAMIN D  25 Hydroxy (Vit-D Deficiency, Fractures) (Completed)   Other Visit Diagnoses       Uncontrolled diabetes mellitus with hyperglycemia, without long-term current use of insulin  (HCC)    -  Primary   Relevant Orders   CBC with Differential/Platelet (Completed)   Comprehensive metabolic panel with GFR (Completed)   Hemoglobin A1c  (Completed)   Microalbumin / creatinine urine ratio (Completed)   Amb Referral to Clinical Pharmacist     QT prolongation          He is here to establish care.  Previous PCP passed away. Encouraged her to let me know which medications she is taking daily.  She was also let me know which pharmacy she would like refill sent to. Diabetes has not been well-controlled for at least 2 years.  She has a Dexcom but does not know how to use it.  States her husband checks her blood sugar with it but she does not recall any of her readings recently. Jardiance  has not been refilled since 2024.  Plan to restart Jardiance  pending renal function and A1c. Continue statin therapy. History of QT prolongation.  Myrbetriq  was on her medication list and contraindicated in the presence of QT prolongation.  She is not sure if she really needs it or not.  Medication will be stopped. Discussed better control of diabetes to prevent worsening renal function. History of vitamin deficiencies.  Follow-up pending lab results. She is aware that I will refer her to our pharmacist, Rainelle Bur, if her A1c is still elevated.  Return in about 4 weeks (around 09/15/2023) for Fasting follow up.   Alyson Back, NP-C

## 2023-08-20 ENCOUNTER — Telehealth: Payer: Self-pay | Admitting: Family Medicine

## 2023-08-20 ENCOUNTER — Other Ambulatory Visit: Payer: Self-pay | Admitting: Family Medicine

## 2023-08-20 DIAGNOSIS — E1122 Type 2 diabetes mellitus with diabetic chronic kidney disease: Secondary | ICD-10-CM

## 2023-08-20 DIAGNOSIS — E782 Mixed hyperlipidemia: Secondary | ICD-10-CM

## 2023-08-20 DIAGNOSIS — E039 Hypothyroidism, unspecified: Secondary | ICD-10-CM

## 2023-08-20 DIAGNOSIS — I10 Essential (primary) hypertension: Secondary | ICD-10-CM

## 2023-08-20 NOTE — Telephone Encounter (Signed)
 LM Vickie wants a FASTING OFFICE VISIT, not lab visit. Advised to call and schedule 4 week f/u fasting appt

## 2023-08-20 NOTE — Telephone Encounter (Signed)
 Copied from CRM [id number]. Topic: Clinical - Medication Refill >> Aug 20, 2023  8:34 AM Dewanda Foots wrote: Medication:   amLODipine  (NORVASC ) 5 MG tablet atorvastatin  (LIPITOR) 40 MG tablet glipiZIDE  (GLUCOTROL ) 10 MG tablet levothyroxine  (SYNTHROID ) 50 MCG tablet lisinopril -hydrochlorothiazide  (ZESTORETIC ) 10-12.5 MG tablet metFORMIN  (GLUCOPHAGE -XR) 500 MG 24 hr tablet    Has the patient contacted their pharmacy? No, does not wish to contact pharmacy (Agent: If no, request that the patient contact the pharmacy for the refill. If patient does not wish to contact the pharmacy document the reason why and proceed with request.) (Agent: If yes, when and what did the pharmacy advise?)  This is the patient's preferred pharmacy:  [facility] - [place], [place] - [address] [place] [address] Phone: [phone] Fax: [phone]  Is this the correct pharmacy for this prescription? Yes If no, delete pharmacy and type the correct one.   Has the prescription been filled recently? No  Is the patient out of the medication? No  Has the patient been seen for an appointment in the last year OR does the patient have an upcoming appointment? Yes  Can we respond through MyChart? Yes  Agent: Please be advised that Rx refills may take up to 3 business days. We ask that you follow-up with your pharmacy.

## 2023-08-20 NOTE — Telephone Encounter (Signed)
 Last Fill: Amlodipine : 04/13/23     Atorvastatin : 04/13/23     Glipizide : 04/13/23     Levothyroxine : 02/01/23     Zestoretic : 04/13/23     Metformin : 04/13/23  Last OV: 08/18/23 Next OV: None Scheduled  Routing to provider for review/authorization.

## 2023-08-20 NOTE — Telephone Encounter (Signed)
 Copied from CRM [id number]. Topic: Clinical - Request for Lab/Test Order >> Aug 20, 2023  8:42 AM Dewanda Foots wrote: Reason for CRM: Husband Portia Brittle states that Vickie requested lab orders be put in so Debria Fang can come in, in 30 days to do bloodwork. Requesting lab orders so she can do that.

## 2023-08-20 NOTE — Telephone Encounter (Signed)
 Copied from CRM [id number]. Topic: Clinical - Medication Question >> Aug 20, 2023  8:40 AM Dewanda Foots wrote: Reason for CRM: Husband Portia Brittle called and requests that the following medications be removed from the chart-states he tried to do it through MyChart but it did not work:  Biotin 10 MG TABS Calcium  Carbonate-Vitamin D  (CALCIUM -VITAMIN D ) 500-200 MG-UNIT per tablet CINNAMON PO Continuous Glucose Receiver (DEXCOM G7 RECEIVER) DEVI empagliflozin  (JARDIANCE ) 25 MG TABS tablet traZODone  (DESYREL ) 150 MG tablet triamcinolone  cream (KENALOG ) 0.5 %

## 2023-08-23 ENCOUNTER — Other Ambulatory Visit: Payer: Self-pay

## 2023-08-23 MED ORDER — LISINOPRIL-HYDROCHLOROTHIAZIDE 10-12.5 MG PO TABS: 1.0000 | ORAL_TABLET | Freq: Every day | ORAL | 1 refills | Status: DC

## 2023-08-23 MED ORDER — ATORVASTATIN CALCIUM 40 MG PO TABS: ORAL_TABLET | ORAL | 1 refills | Status: DC

## 2023-08-23 MED ORDER — AMLODIPINE BESYLATE 5 MG PO TABS: ORAL_TABLET | ORAL | 1 refills | Status: DC

## 2023-08-23 MED ORDER — METFORMIN HCL ER 500 MG PO TB24: ORAL_TABLET | ORAL | 1 refills | Status: DC

## 2023-08-23 MED ORDER — EMPAGLIFLOZIN 25 MG PO TABS: ORAL_TABLET | ORAL | 1 refills | Status: DC

## 2023-08-23 MED ORDER — GLIPIZIDE 10 MG PO TABS: ORAL_TABLET | ORAL | 1 refills | Status: DC

## 2023-08-23 MED ORDER — LEVOTHYROXINE SODIUM 50 MCG PO TABS: ORAL_TABLET | ORAL | 1 refills | Status: DC

## 2023-08-23 NOTE — Telephone Encounter (Signed)
 LM for pt to let her know we d/c all the below meds besides Jardiance  as Vickie would like pt to restart due to uncontrolled diabetes

## 2023-08-24 ENCOUNTER — Ambulatory Visit: Payer: Medicare PPO | Admitting: Nurse Practitioner

## 2023-09-16 ENCOUNTER — Ambulatory Visit: Admitting: Pharmacist

## 2023-09-16 ENCOUNTER — Ambulatory Visit: Admitting: Family Medicine

## 2023-09-16 ENCOUNTER — Encounter: Payer: Self-pay | Admitting: Family Medicine

## 2023-09-16 VITALS — BP 120/68 | HR 55 | Temp 97.8°F | Ht 63.5 in | Wt 101.0 lb

## 2023-09-16 DIAGNOSIS — Z711 Person with feared health complaint in whom no diagnosis is made: Secondary | ICD-10-CM

## 2023-09-16 DIAGNOSIS — I10 Essential (primary) hypertension: Secondary | ICD-10-CM

## 2023-09-16 DIAGNOSIS — E538 Deficiency of other specified B group vitamins: Secondary | ICD-10-CM

## 2023-09-16 DIAGNOSIS — E1169 Type 2 diabetes mellitus with other specified complication: Secondary | ICD-10-CM

## 2023-09-16 DIAGNOSIS — E1122 Type 2 diabetes mellitus with diabetic chronic kidney disease: Secondary | ICD-10-CM | POA: Diagnosis not present

## 2023-09-16 DIAGNOSIS — R413 Other amnesia: Secondary | ICD-10-CM | POA: Insufficient documentation

## 2023-09-16 DIAGNOSIS — R4189 Other symptoms and signs involving cognitive functions and awareness: Secondary | ICD-10-CM

## 2023-09-16 DIAGNOSIS — E162 Hypoglycemia, unspecified: Secondary | ICD-10-CM

## 2023-09-16 DIAGNOSIS — E1165 Type 2 diabetes mellitus with hyperglycemia: Secondary | ICD-10-CM

## 2023-09-16 DIAGNOSIS — E2839 Other primary ovarian failure: Secondary | ICD-10-CM

## 2023-09-16 DIAGNOSIS — N183 Chronic kidney disease, stage 3 unspecified: Secondary | ICD-10-CM

## 2023-09-16 DIAGNOSIS — E039 Hypothyroidism, unspecified: Secondary | ICD-10-CM

## 2023-09-16 DIAGNOSIS — M8580 Other specified disorders of bone density and structure, unspecified site: Secondary | ICD-10-CM

## 2023-09-16 DIAGNOSIS — R9431 Abnormal electrocardiogram [ECG] [EKG]: Secondary | ICD-10-CM

## 2023-09-16 DIAGNOSIS — E785 Hyperlipidemia, unspecified: Secondary | ICD-10-CM

## 2023-09-16 DIAGNOSIS — E559 Vitamin D deficiency, unspecified: Secondary | ICD-10-CM | POA: Diagnosis not present

## 2023-09-16 MED ORDER — GLIPIZIDE 10 MG PO TABS: ORAL_TABLET | ORAL | Status: DC

## 2023-09-16 NOTE — Assessment & Plan Note (Signed)
Controlled. Continue current medications and low sodium diet.

## 2023-09-16 NOTE — Progress Notes (Signed)
 Subjective:     Patient ID: Elizabeth May, female    DOB: 1945-07-29, 78 y.o.   MRN: [id number]  Chief Complaint  Patient presents with   Medical Management of Chronic Issues    Fasting  Has some questions regarding her diabetes and wants to discuss findings    HPI  History of Present Illness         She is here for follow-up visit.  Her husband is with her today. At her previous visit she was alone and unable to provide me with the name of a pharmacy that she wanted to use or to provide an accurate list of medications she was taking. Her husband reports a concern regarding her memory.  States she repeats herself a lot.  Patient is also slightly concerned.  She drives and denies getting lost.  Diabetes uncontrolled- A1c 9.1%  She has a CGM -no longer using DEXA, now using Stelo OTC  FBS 102 today  Readings show 22% below target range   She reports reducing her carbohydrate intake significantly since her last visit. States she exercises.  We started Jardiance  at her last visit.  Reports good compliance with all of her medications.  B12 deficiency.  Her last B12 was low normal.  HLD-he is taking atorvastatin  40 mg daily.  Her last lipid panel was in goal range.  Hypothyroidism-taking levothyroxine  50 mcg daily.  Her last TSH was in therapeutic range.  Hypertension-taking lisinopril  HCTZ 10-12.5 mg daily and amlodipine  5 mg daily.   Denies smoking or recreational drugs. Drinks alcohol -1 glass of wine 3 nights per week. 1 bourbon some nights.   History of long QT.  They are both aware of this.  Asymptomatic.  No history of syncope.  States she saw cardiology for this in the past.  She is overdue for bone density.  Her previous DEXA in August 2022 showed osteopenia.  [place] travel a lot to [facility].  Upcoming trip.      09/16/2023    9:26 AM 08/18/2023   11:37 AM 08/18/2022   10:39 AM 09/16/2021   10:55 AM 12/17/2020   10:38 PM  Depression screen PHQ 2/9   Decreased Interest 0 0 0 0 0  Down, Depressed, Hopeless 0 0 0 0 0  PHQ - 2 Score 0 0 0 0 0  Altered sleeping 0      Tired, decreased energy 0      Change in appetite 0      Feeling bad or failure about yourself  0      Trouble concentrating 0      Moving slowly or fidgety/restless 0      Suicidal thoughts 0      PHQ-9 Score 0      Difficult doing work/chores Not difficult at all           09/16/2023    9:27 AM  GAD 7 : Generalized Anxiety Score  Nervous, Anxious, on Edge 0  Control/stop worrying 0  Worry too much - different things 0  Trouble relaxing 0  Restless 0  Easily annoyed or irritable 0  Afraid - awful might happen 0  Total GAD 7 Score 0  Anxiety Difficulty Not difficult at all       Health Maintenance Due  Topic Date Due   FOOT EXAM  05/14/2022   OPHTHALMOLOGY EXAM  05/30/2022   DEXA SCAN  11/01/2022   COVID-19 Vaccine (5 - 2024-25 season) 11/22/2022   Medicare  Annual Wellness (AWV)  08/18/2023    Past Medical History:  Diagnosis Date   Diabetes mellitus    Headache(784.0)    hx migraines   Hyperlipidemia    Hypertension    Hypothyroidism     Past Surgical History:  Procedure Laterality Date   ANTERIOR CERVICAL DECOMP/DISCECTOMY FUSION  10/22/2011   Procedure: ANTERIOR CERVICAL DECOMPRESSION/DISCECTOMY FUSION 1 LEVEL;  Surgeon: Oneil Rodgers Priestly, MD;  Location: [facility] OR;  Service: Orthopedics;  Laterality: Right;  Anterior cervical decompression fusion, cervical 5-6 with instrumentation and allograft.   BREAST EXCISIONAL BIOPSY Left    30 years ago - benign   BREAST SURGERY  80's   lft cyst    Family History  Problem Relation Age of Onset   Hypertension Mother    Hyperlipidemia Mother    Heart disease Mother    Alzheimer's disease Father    Drug abuse Maternal Grandmother    Diabetes Maternal Grandmother    Diabetes Maternal Aunt    Diabetes Maternal Uncle    Alzheimer's disease Paternal Uncle    Alzheimer's disease Paternal Aunt      Social History   Socioeconomic History   Marital status: Married    Spouse name: Not on file   Number of children: 1   Years of education: Not on file   Highest education level: Not on file  Occupational History   Not on file  Tobacco Use   Smoking status: Never   Smokeless tobacco: Never  Vaping Use   Vaping status: Never Used  Substance and Sexual Activity   Alcohol use: Yes    Alcohol/week: 7.0 standard drinks of alcohol    Types: 7 Glasses of wine per week   Drug use: No   Sexual activity: Yes    Partners: Male    Birth control/protection: Post-menopausal  Other Topics Concern   Not on file  Social History Narrative   Not on file   Social Drivers of Health   Financial Resource Strain: Not on file  Food Insecurity: Not on file  Transportation Needs: Not on file  Physical Activity: Sufficiently Active (02/08/2018)   Exercise Vital Sign    Days of Exercise per Week: 5 days    Minutes of Exercise per Session: 50 min  Stress: No Stress Concern Present (02/08/2018)   [employer] of Occupational Health - Occupational Stress Questionnaire    Feeling of Stress : Only a little  Social Connections: Not on file  Intimate Partner Violence: Not on file    Outpatient Medications Prior to Visit  Medication Sig Dispense Refill   amLODipine  (NORVASC ) 5 MG tablet TAKE 1 TABLET EVERY DAY FOR BLOOD PRESSURE 90 tablet 1   atorvastatin  (LIPITOR) 40 MG tablet TAKE 1 TABLET EVERY DAY FOR CHOLESTEROL 90 tablet 1   empagliflozin  (JARDIANCE ) 25 MG TABS tablet TAKE 1 TABLET DAILY. 90 tablet 1   levothyroxine  (SYNTHROID ) 50 MCG tablet TAKE 1 TABLET DAILY ON AN EMPTY STOMACH WITH ONLY WATER FOR 30 MINUTES 90 tablet 1   lisinopril -hydrochlorothiazide  (ZESTORETIC ) 10-12.5 MG tablet Take 1 tablet by mouth daily. 90 tablet 1   metFORMIN  (GLUCOPHAGE -XR) 500 MG 24 hr tablet TAKE 2 TABLETS TWICE DAILY WITH MEALS FOR DIABETES 360 tablet 1   glipiZIDE  (GLUCOTROL ) 10 MG tablet TAKE 1/2 TO  1 TABLET TWICE DAILY WITH MEALS. SKIP THIS MEDICATION IF LOW SUGARS, THROWING UP OR NOT EATING. 180 tablet 1   No facility-administered medications prior to visit.    No Known Allergies  Review of Systems  Constitutional:  Negative for chills, fever and malaise/fatigue.  Eyes:  Negative for blurred vision and double vision.  Respiratory:  Negative for shortness of breath.   Cardiovascular:  Negative for chest pain, palpitations and leg swelling.  Gastrointestinal:  Negative for abdominal pain, constipation, diarrhea, nausea and vomiting.  Genitourinary:  Negative for dysuria, frequency and urgency.  Musculoskeletal:  Negative for falls.  Neurological:  Negative for dizziness, speech change, focal weakness and headaches.  Psychiatric/Behavioral:  Negative for depression. The patient is not nervous/anxious.        Objective:    Physical Exam Constitutional:      General: She is not in acute distress.    Appearance: She is not ill-appearing.  HENT:     Mouth/Throat:     Mouth: Mucous membranes are moist.     Pharynx: Oropharynx is clear.   Eyes:     Extraocular Movements: Extraocular movements intact.     Conjunctiva/sclera: Conjunctivae normal.    Cardiovascular:     Rate and Rhythm: Normal rate.  Pulmonary:     Effort: Pulmonary effort is normal.   Musculoskeletal:     Cervical back: Normal range of motion and neck supple.     Right lower leg: No edema.     Left lower leg: No edema.   Skin:    General: Skin is warm and dry.   Neurological:     General: No focal deficit present.     Mental Status: She is alert.     Cranial Nerves: No facial asymmetry.     Motor: No weakness.     Coordination: Coordination normal.     Gait: Gait normal.     Comments: Oriented to person, place and month. Incorrect year. Unable to perform recall testing.  Decreased scoring on cognitive testing   Psychiatric:        Mood and Affect: Mood normal.        Behavior: Behavior normal.         Thought Content: Thought content normal.      BP 120/68 (BP Location: Left Arm, Patient Position: Sitting)   Pulse (!) 55   Temp 97.8 F (36.6 C) (Temporal)   Ht 5' 3.5 (1.613 m)   Wt 101 lb (45.8 kg)   SpO2 98%   BMI 17.61 kg/m  Wt Readings from Last 3 Encounters:  09/16/23 101 lb (45.8 kg)  08/18/23 105 lb (47.6 kg)  05/05/23 108 lb (49 kg)       Assessment & Plan:   Problem List Items Addressed This Visit     B12 deficiency   Previous B12 low normal.  Encouraged B12 supplement      CKD stage 3 due to type 2 diabetes mellitus (HCC)   Counseling on chronic kidney disease and explained that this is due to uncontrolled diabetes.  Currently taking Jardiance .  Continue to monitor renal function      Cognitive changes   Cognitive testing abnormal.  Referral to neurology for further workup.      Relevant Orders   Ambulatory referral to Neurology   Concern about memory   Relevant Orders   Ambulatory referral to Neurology   Essential hypertension   Controlled.  Continue current medications and low-sodium diet.      Relevant Orders   AMB Referral [facility] Care Management   Hyperlipidemia associated with type 2 diabetes mellitus (HCC)   Continue statin therapy and  low-fat diet.  Hypoglycemia   This is most likely due to significant reduction in sugar and carbohydrates.  She met with pharmacist Bari Drum today.  Counseling done on a healthy diet with protein and healthy carbs.  She is using a CGM.  Will continue monitoring.  Glipizide  dose reduced by half.      Hypothyroidism   Continue current dose of levothyroxine .  TSH in normal range.      Osteopenia   Relevant Orders   DG Bone Density   QT prolongation   This is not new.  She and her husband have been aware of this for years and states they saw cardiology in the past.  She will continue avoiding medications that could worsen condition.      Uncontrolled diabetes mellitus with hyperglycemia,  without long-term current use of insulin  (HCC) - Primary   He is working on a healthier diet.  Exercising.  Using CGM to monitor blood sugars.  Reduce glipizide  to 5 mg daily due to recent hypoglycemic episodes.  Continue other medications as prescribed.  Follow-up in approximately 2 months      Relevant Orders   AMB Referral VBCI Care Management   Vitamin D  deficiency   Continue vitamin D  supplement      Other Visit Diagnoses       Estrogen deficiency       Relevant Orders   DG Bone Density      Visit time 35 minutes in face to face communication with patient and coordination of care, additional 10 minutes spent in record review, coordination or care, ordering tests, communicating/referring to other healthcare professionals, documenting in medical records all on the same day of the visit for total time 45  minutes spent on the visit.    I am having Dominik W. Kobs CONNIE maintain her amLODipine , atorvastatin , levothyroxine , lisinopril -hydrochlorothiazide , metFORMIN , and empagliflozin .  No orders of the defined types were placed in this encounter.

## 2023-09-16 NOTE — Assessment & Plan Note (Signed)
 Continue current dose of levothyroxine .  TSH in normal range.

## 2023-09-16 NOTE — Patient Instructions (Signed)
 It was a pleasure speaking with you today!  Decrease your glipizide  to 1/2 tablet twice daily WITH FOOD. Continue taking metformin  and jardiance .   Focus on eating low glycemic index carbohydrates and pairing them with fiber and protein sources to help decrease glucose spikes.  I will follow up with you in 2 weeks.  Feel free to call with any questions or concerns!  Darrelyn Drum, PharmD, BCPS, CPP Clinical Pharmacist Practitioner [facility] at [facility] [phone]

## 2023-09-16 NOTE — Assessment & Plan Note (Signed)
Continue statin therapy and low fat diet

## 2023-09-16 NOTE — Assessment & Plan Note (Signed)
 Counseling on chronic kidney disease and explained that this is due to uncontrolled diabetes.  Currently taking Jardiance .  Continue to monitor renal function

## 2023-09-16 NOTE — Progress Notes (Signed)
 09/16/2023 Name: Elizabeth May MRN: [id number] DOB: Jan 01, 1946  Chief Complaint  Patient presents with   Diabetes   Medication Management   Hypoglycemia    Elizabeth May is a 78 y.o. year old female who was referred for medication management by their primary care provider, Elizabeth May, Elizabeth May. They presented for a face to face visit today with her husband, Elizabeth May.   They were referred to the pharmacist by their PCP for assistance in managing diabetes    Subjective:  Care Team: Primary Care Provider: Lendia Boby CROME, May ; Next Scheduled Visit: none scheduled   Medication Access/Adherence  Current Pharmacy:  [facility] - [place], [place] - [address] [place] [address] Phone: [phone] Fax: [phone]   Patient reports affordability concerns with their medications: No  Patient reports access/transportation concerns to their pharmacy: No  Patient reports adherence concerns with their medications:  No    *Likely some medication nonadherence based on refill history  Diabetes:  Current medications: metformin  XR 500 mg 2 tablets twice daily, Jardiance  25 mg daily, glipizide  10 mg 1 tablet twice daily  She has recently started taking 2 new supplements for diabetes. One contains mulberry extract and cinnamon and the other contains garcinia cambogia  Using Dexcom Stelo Current glucose readings:    Date of Download: 09/16/23 % Time CGM is active: 86% Average Glucose: 205 mg/dL Glucose Management Indicator: 8.2  Glucose Variability: 41.6 (goal <36%) Time in Goal:  - Time in range 70-180: 32% - Time above range: 63% - Time below range: 5% Observed patterns: Pt has been having significant lows that are a new occurrence over the last several days  Patient reports hypoglycemic s/sx including dizziness, shakiness, sweating. Patient denies hyperglycemic symptoms including polyuria, polydipsia, polyphagia,  nocturia, neuropathy, blurred vision.  Current meal patterns:  She has recently cut back significantly on carbs    Objective:  Lab Results  Component Value Date   HGBA1C 9.1 (H) 08/18/2023    Lab Results  Component Value Date   CREATININE 1.07 08/18/2023   BUN 32 (H) 08/18/2023   NA 138 08/18/2023   K 4.1 08/18/2023   CL 102 08/18/2023   CO2 27 08/18/2023    Lab Results  Component Value Date   CHOL 109 08/18/2023   HDL 51.50 08/18/2023   LDLCALC 39 08/18/2023   TRIG 93.0 08/18/2023   CHOLHDL 2 08/18/2023    Medications Reviewed Today     Reviewed by Elizabeth May, RPH (Pharmacist) on 09/16/23 at [phone]  Med List Status: <None>   Medication Order Taking? Sig Documenting Provider Last Dose Status Informant  amLODipine  (NORVASC ) 5 MG tablet [id number] Yes TAKE 1 TABLET EVERY DAY FOR BLOOD PRESSURE Elizabeth May  Active   atorvastatin  (LIPITOR) 40 MG tablet [id number] Yes TAKE 1 TABLET EVERY DAY FOR CHOLESTEROL Elizabeth May  Active   empagliflozin  (JARDIANCE ) 25 MG TABS tablet [id number] Yes TAKE 1 TABLET DAILY. Elizabeth May  Active   glipiZIDE  (GLUCOTROL ) 10 MG tablet [id number] Yes TAKE 1/2 TO 1 TABLET TWICE DAILY WITH MEALS. SKIP THIS MEDICATION IF LOW SUGARS, THROWING UP OR NOT EATING. Elizabeth May  Active   levothyroxine  (SYNTHROID ) 50 MCG tablet [id number] Yes TAKE 1 TABLET DAILY ON AN EMPTY STOMACH WITH ONLY WATER FOR 30 MINUTES Elizabeth May  Active   lisinopril -hydrochlorothiazide  (ZESTORETIC ) 10-12.5 MG tablet [id number] Yes Take 1 tablet by mouth  daily. Elizabeth May, Elizabeth May  Active   metFORMIN  (GLUCOPHAGE -XR) 500 MG 24 hr tablet [id number] Yes TAKE 2 TABLETS TWICE DAILY WITH MEALS FOR DIABETES Elizabeth May  Active               Assessment/Plan:   Diabetes: - Currently uncontrolled, A1c goal <8% - Reviewed dietary modifications including balanced meals/snacks, increased protein and fiber with  carbohydrates - Recommend to discontinue the supplement containing garcinia cambogia due to potential weight loss and safety concerns with reports of liver injury in the past with this supplement - Recommend to decrease glipizide  to 1/2 tablet twice daily with food, continue metformin  and Jardiance  - Connected Dexcom Stelo to [facility] - Will follow up in 2 weeks to see if lows have continued occurring. May need to discontinue the sulfonylurea   Follow Up Plan: 7/10  Elizabeth May, PharmD, BCPS, CPP Clinical Pharmacist Practitioner [facility] [phone]

## 2023-09-16 NOTE — Assessment & Plan Note (Signed)
 Previous B12 low normal.  Encouraged B12 supplement

## 2023-09-16 NOTE — Assessment & Plan Note (Signed)
 Cognitive testing abnormal.  Referral to neurology for further workup.

## 2023-09-16 NOTE — Assessment & Plan Note (Signed)
 He is working on a [employer].  Exercising.  Using CGM to monitor blood sugars.  Reduce glipizide  to 5 mg daily due to recent hypoglycemic episodes.  Continue other medications as prescribed.  Follow-up in approximately 2 months

## 2023-09-16 NOTE — Assessment & Plan Note (Signed)
 This is not new.  She and her husband have been aware of this for years and states they saw cardiology in the past.  She will continue avoiding medications that could worsen condition.

## 2023-09-16 NOTE — Assessment & Plan Note (Signed)
 This is most likely due to significant reduction in sugar and carbohydrates.  She met with pharmacist Bari Drum today.  Counseling done on a healthy diet with protein and healthy carbs.  She is using a CGM.  Will continue monitoring.  Glipizide  dose reduced by half.

## 2023-09-16 NOTE — Patient Instructions (Addendum)
   Please take a vitamin B12 supplement over the counter. 1,000 mcg daily.   I referred you to [facility] and they will call you to schedule a visit.   Please be sure we have the correct phone number for them to call.    Follow up with me in 3 months or sooner if needed.

## 2023-09-16 NOTE — Assessment & Plan Note (Signed)
 Continue vitamin D supplement

## 2023-09-22 ENCOUNTER — Encounter: Payer: Self-pay | Admitting: Family Medicine

## 2023-09-29 ENCOUNTER — Telehealth: Payer: Self-pay | Admitting: Family Medicine

## 2023-09-29 NOTE — Telephone Encounter (Unsigned)
 Copied from CRM [id number]. Topic: Appointments - Scheduling Inquiry for Clinic >> Sep 29, 2023  1:46 PM [place]ique E wrote: Reason for CRM: Patient's spouse, [name], called in wanting to reschedule patient's pharmacist appointment that is tomorrow morning. Callback number for spouse is [phone].

## 2023-09-30 ENCOUNTER — Other Ambulatory Visit

## 2023-09-30 DIAGNOSIS — E1165 Type 2 diabetes mellitus with hyperglycemia: Secondary | ICD-10-CM

## 2023-09-30 NOTE — Progress Notes (Signed)
 09/30/2023 Name: Elizabeth May MRN: [id number] DOB: Jan 01, 1946  Chief Complaint  Patient presents with   Medication Management   Diabetes    Elizabeth May is a 78 y.o. year old female who was referred for medication management by their primary care provider, Lendia, Vickie L, NP-C. They presented for a face to face visit today with her husband, [name].   They were referred to the pharmacist by their PCP for assistance in managing diabetes    Subjective:  Care Team: Primary Care Provider: Lendia Boby CROME, NP-C ; Next Scheduled Visit: none scheduled   Medication Access/Adherence  Current Pharmacy:  [facility] - [place], [place] - [address] [place] [address] Phone: [phone] Fax: [phone]   Patient reports affordability concerns with their medications: No  Patient reports access/transportation concerns to their pharmacy: No  Patient reports adherence concerns with their medications:  No    *Likely some medication nonadherence based on refill history  Diabetes:  Current medications: metformin  XR 500 mg 2 tablets twice daily, Jardiance  25 mg daily, glipizide  10 mg 1/2 tablet twice daily  Also taking a supplement for diabetes that contains mulberry extract and cinnamon  Using Dexcom Stelo Current glucose readings:     Date of Download: 09/30/23 % Time CGM is active: 88% Average Glucose: 181 mg/dL Glucose Management Indicator: 7.7 Glucose Variability: 34 (goal <36%) Time in Goal:  - Time in range 70-180: 57% - Time above range: 43% - Time below range: <1% Observed patterns: Her BG has been significantly elevated the last 2 days due to diet choices however prior to that BG were regularly in range  Patient denies hypoglycemic s/sx including dizziness, shakiness, sweating. Patient denies hyperglycemic symptoms including polyuria, polydipsia, polyphagia, nocturia, neuropathy, blurred vision.  Current  meal patterns:  She had recently cut back significantly on carbs but was on vacation the last couple of weeks therefore was eating more fried foods/fast food.     Objective:  Lab Results  Component Value Date   HGBA1C 9.1 (H) 08/18/2023    Lab Results  Component Value Date   CREATININE 1.07 08/18/2023   BUN 32 (H) 08/18/2023   NA 138 08/18/2023   K 4.1 08/18/2023   CL 102 08/18/2023   CO2 27 08/18/2023    Lab Results  Component Value Date   CHOL 109 08/18/2023   HDL 51.50 08/18/2023   LDLCALC 39 08/18/2023   TRIG 93.0 08/18/2023   CHOLHDL 2 08/18/2023    Medications Reviewed Today     Reviewed by Merceda Lela SAUNDERS, RPH (Pharmacist) on 09/30/23 at 1022  Med List Status: <None>   Medication Order Taking? Sig Documenting Provider Last Dose Status Informant  amLODipine  (NORVASC ) 5 MG tablet [id number] Yes TAKE 1 TABLET EVERY DAY FOR BLOOD PRESSURE Henson, Vickie L, NP-C  Active   atorvastatin  (LIPITOR) 40 MG tablet [id number] Yes TAKE 1 TABLET EVERY DAY FOR CHOLESTEROL Henson, Vickie L, NP-C  Active   empagliflozin  (JARDIANCE ) 25 MG TABS tablet [id number] Yes TAKE 1 TABLET DAILY. Henson, Vickie L, NP-C  Active   glipiZIDE  (GLUCOTROL ) 10 MG tablet [id number] Yes TAKE 1/2 TABLET TWICE DAILY WITH MEALS. SKIP THIS MEDICATION IF LOW SUGARS, THROWING UP OR NOT EATING. Henson, Vickie L, NP-C  Active   levothyroxine  (SYNTHROID ) 50 MCG tablet [id number] Yes TAKE 1 TABLET DAILY ON AN EMPTY STOMACH WITH ONLY WATER FOR 30 MINUTES Henson, Vickie L, NP-C  Active   lisinopril -hydrochlorothiazide  (ZESTORETIC ) 10-12.5  MG tablet [id number] Yes Take 1 tablet by mouth daily. Henson, Vickie L, NP-C  Active   metFORMIN  (GLUCOPHAGE -XR) 500 MG 24 hr tablet [id number] Yes TAKE 2 TABLETS TWICE DAILY WITH MEALS FOR DIABETES Henson, Vickie L, NP-C  Active               Assessment/Plan:   Diabetes: - Currently uncontrolled, A1c goal <8% - Lows have improved/mostly resolved since decreasing  glipizide . Overall average is improved from 205 down to 181 over the last 2 weeks with Time In Range improving from 32% to 57%. - Reviewed dietary modifications including balanced meals/snacks, increased protein and fiber with carbohydrates - Recommend to continue decreased glipizide  10 mg 1/2 tablet twice daily with food, continue metformin  and Jardiance  - Connected Dexcom Stelo to [facility] - Pt's husband prefers to wait until follow up with Vickie in 2 months   Follow Up Plan: PRN  Darrelyn Drum, PharmD, BCPS, CPP Clinical Pharmacist Practitioner [facility] [phone]

## 2023-09-30 NOTE — Patient Instructions (Addendum)
 It was a pleasure speaking with you today!  Continue all medications the same, including glipizide  10 mg 1/2 tablet twice daily with food.   How to treat low blood sugar:  - For blood sugar less than 70: Treat with 4 ounces of juice or regular soda, or with 3 to 4 glucose tablets.  - Re-check blood sugar in 15 minutes. ?  -If blood sugar is still less than 70 on re-check, treat again and re-check in 15 minutes - Once blood sugar is back above 70, eat a balanced meal or snack to avoid blood sugar dropping again   Feel free to call with any questions or concerns!  Darrelyn Drum, PharmD, BCPS, CPP Clinical Pharmacist Practitioner [facility] [phone]

## 2023-10-01 NOTE — Addendum Note (Signed)
 Addended by: Aisha Greenberger E on: 10/01/2023 09:06 AM   Modules accepted: Orders

## 2023-10-05 ENCOUNTER — Ambulatory Visit: Payer: Medicare PPO | Admitting: Nurse Practitioner

## 2023-10-05 ENCOUNTER — Other Ambulatory Visit: Payer: Self-pay

## 2023-10-05 ENCOUNTER — Encounter: Payer: Self-pay | Admitting: Physician Assistant

## 2023-10-05 DIAGNOSIS — R4189 Other symptoms and signs involving cognitive functions and awareness: Secondary | ICD-10-CM

## 2023-10-05 DIAGNOSIS — Z711 Person with feared health complaint in whom no diagnosis is made: Secondary | ICD-10-CM

## 2023-10-05 NOTE — Telephone Encounter (Signed)
 Referral replaced to [facility]

## 2023-10-05 NOTE — Telephone Encounter (Signed)
 Copied from CRM [id number]. Topic: Referral - Question >> Oct 05, 2023  9:13 AM Burnard DEL wrote: Reason for CRM: Patients husband called in stating that [facility] neurology stated that they have not received a referral on behalf oh his wife.

## 2023-10-10 NOTE — Progress Notes (Incomplete)
 Assessment/Plan:     Elizabeth May is a very pleasant 78 y.o. year old RH female with a history of  hyperlipidemia, hypothyroidism, DM2, B12 and vitamin D  deficiency, CKD, QT prolongation, seen today for evaluation of memory loss. MoCA today is .  Etiology is unclear, workup is in progress.  Patient is able to participate on ADLs*** Discussed starting donepezil 5 mg daily with goal of 10 mg daily if tolerated, patient agrees to proceed.   Memory Impairment of unclear etiology, concern for ***  MRI brain without contrast to assess for underlying structural abnormality and assess vascular load  Neurocognitive testing to further evaluate cognitive concerns and determine other underlying cause of memory changes, including potential contribution from sleep, anxiety, attention, or depression among others  Replenish B12 Recommend good control of cardiovascular risk factors.   Continue to control mood as per PCP Fol[place]ow up in ***months   Subjective:    The patient is accompanied by ***  who supplements  the history.    How long did patient have memory difficulties?  For about.  Patient reports some difficulty remembering new information, recent conversations, names. repeats oneself?  Endorsed Disoriented when walking into a room? Denies ***  Leaving objects in unusual places?  Denies.   Wandering behavior? Denies.   Any personality changes, or depression, anxiety? Denies *** Hallucinations or paranoia? Denies.   Seizures? Denies.    Any sleep changes?  Sleeps well *** Does not sleep well. **  frequent nightmares or dream reenactment, other REM behavior or sleepwalking   Sleep apnea? Denies.   Any hygiene concerns?  Denies.   Independent of bathing and dressing? Endorsed  Does the patient need help with medications?  is in charge *** Who is in charge of the finances?  is in charge   *** Any changes in appetite?   Denies. ***   Patient have trouble swallowing?  Denies.   Does the  patient cook? No*** yes, denies forgetting common recipes or kitchen accidents *** Any headaches?  Denies.   Chronic pain? Denies.   Ambulates with difficulty? Denies. ***  Needs a cane*** Needs a walker *** to ambulate for stability.   Recent falls or head injuries? Denies.     Vision changes?  Denies any new issues.  Has a history of*** Any strokelike symptoms? Denies.   Any tremors? Denies. *** Any anosmia? Denies.   Any incontinence of urine? Denies.   Any bowel dysfunction? Denies.      Patient lives with ***  History of heavy alcohol intake? Denies.   History of heavy tobacco use? Denies.   Family history of dementia?   *** with dementia  Does patient drive? No longer drives  *** yes, denies getting lost.***  Recent labs TSH 2.89, vitamin D  47, vitamin B12 341, normal lipid panel, A1c 9.1, normal CBC.  No Known Allergies  Current Outpatient Medications  Medication Instructions   amLODipine  (NORVASC ) 5 MG tablet TAKE 1 TABLET EVERY DAY FOR BLOOD PRESSURE   atorvastatin  (LIPITOR) 40 MG tablet TAKE 1 TABLET EVERY DAY FOR CHOLESTEROL   empagliflozin  (JARDIANCE ) 25 MG TABS tablet TAKE 1 TABLET DAILY.   glipiZIDE  (GLUCOTROL ) 10 MG tablet TAKE 1/2 TABLET TWICE DAILY WITH MEALS. SKIP THIS MEDICATION IF LOW SUGARS, THROWING UP OR NOT EATING.   levothyroxine  (SYNTHROID ) 50 MCG tablet TAKE 1 TABLET DAILY ON AN EMPTY STOMACH WITH ONLY WATER FOR 30 MINUTES   lisinopril -hydrochlorothiazide  (ZESTORETIC ) 10-12.5 MG tablet 1 tablet, Oral, Daily  metFORMIN  (GLUCOPHAGE -XR) 500 MG 24 hr tablet TAKE 2 TABLETS TWICE DAILY WITH MEALS FOR DIABETES     VITALS:  There were no vitals filed for this visit.   Physical Exam  :     No data to display              No data to display             HEENT:  Normocephalic, atraumatic.  The superficial temporal arteries are without ropiness or tenderness. Cardiovascular: Regular rate and rhythm. Lungs: Clear to auscultation bilaterally. Neck:  There are no carotid bruits noted bilaterally. Orientation:  Alert and oriented to person, place and not to time***. No aphasia or dysarthria. Fund of knowledge is appropriate. Recent and remote memory impaired.  Attention and concentration are reduced***.  Able to name objects and repeat phrases. *** Delayed recall  /5 .*** Cranial nerves: There is good facial symmetry. Extraocular muscles are intact and visual fields are full to confrontational testing. Speech is fluent and clear. No tongue deviation. Hearing is intact to conversational tone.*** Tone: Tone is good throughout. Sensation: Sensation is intact to light touch.  Vibration is intact at the bilateral big toe.  Coordination: The patient has no difficulty with RAM's or FNF bilaterally. Normal finger to nose  Motor: Strength is 5/5 in the bilateral upper and lower extremities. There is no pronator drift. There are no fasciculations noted. DTR's: Deep tendon reflexes are 2/4 bilaterally. Gait and Station: The patient is able to ambulate without difficulty. Gait is cautious and narrow. Stride length is normal. ***      Thank you for allowing us  the opportunity to participate in the care of this nice patient. Please do not hesitate to contact us  for any questions or concerns.   Total time spent on today's visit was *** minutes dedicated to this patient today, preparing to see patient, examining the patient, ordering tests and/or medications and counseling the patient, documenting clinical information in the EHR or other health record, independently interpreting results and communicating results to the patient/family, discussing treatment and goals, answering patient's questions and coordinating care.  Cc:  Lendia Boby CROME, NP-C  Camie Sevin 10/10/2023 5:11 PM

## 2023-10-15 ENCOUNTER — Encounter: Payer: Self-pay | Admitting: Physician Assistant

## 2023-10-15 ENCOUNTER — Ambulatory Visit: Admitting: Physician Assistant

## 2023-10-15 ENCOUNTER — Ambulatory Visit

## 2023-10-15 VITALS — BP 104/55 | HR 56 | Resp 20 | Ht 63.5 in

## 2023-10-15 DIAGNOSIS — R413 Other amnesia: Secondary | ICD-10-CM

## 2023-10-15 NOTE — Patient Instructions (Signed)
 It was a pleasure to see you today at our office.   Recommendations:    MRI of the brain, the radiology office will call you to arrange you appointment   Follow up in 6  months  [email or web address]   RECOMMENDATIONS FOR ALL PATIENTS WITH MEMORY PROBLEMS: 1. Continue to exercise (Recommend 30 minutes of walking everyday, or 3 hours every week) 2. Increase social interactions - continue going to [place] and enjoy social gatherings with friends and family 3. Eat healthy, avoid fried foods and eat more fruits and vegetables 4. Maintain adequate blood pressure, blood sugar, and blood cholesterol level. Reducing the risk of stroke and cardiovascular disease also helps promoting better memory. 5. Avoid stressful situations. Live a simple life and avoid aggravations. Organize your time and prepare for the next day in anticipation. 6. Sleep well, avoid any interruptions of sleep and avoid any distractions in the bedroom that may interfere with adequate sleep quality 7. Avoid sugar, avoid sweets as there is a strong link between excessive sugar intake, diabetes, and cognitive impairment We discussed the Mediterranean diet, which has been shown to help patients reduce the risk of progressive memory disorders and reduces cardiovascular risk. This includes eating fish, eat fruits and green leafy vegetables, nuts like almonds and hazelnuts, walnuts, and also use olive oil. Avoid fast foods and fried foods as much as possible. Avoid sweets and sugar as sugar use has been linked to worsening of memory function.  There is always a concern of gradual progression of memory problems. If this is the case, then we may need to adjust level of care according to patient needs. Support, both to the patient and caregiver, should then be put into place.        DRIVING: Regarding driving, in patients with progressive memory problems, driving will be impaired. We  advise to have someone else do the driving if trouble finding directions or if minor accidents are reported. Independent driving assessment is available to determine safety of driving.   If you are interested in the driving assessment, you can contact the following:  [employer] in [place] [phone]  [facility] [phone]  [facility] [phone]  [facility] [phone] or [phone]   FALL PRECAUTIONS: Be cautious when walking. Scan the area for obstacles that may increase the risk of trips and falls. When getting up in the mornings, sit up at the edge of the bed for a few minutes before getting out of bed. Consider elevating the bed at the head end to avoid drop of blood pressure when getting up. Walk always in a well-lit room (use night lights in the walls). Avoid area rugs or power cords from appliances in the middle of the walkways. Use a walker or a cane if necessary and consider physical therapy for balance exercise. Get your eyesight checked regularly.  FINANCIAL OVERSIGHT: Supervision, especially oversight when making financial decisions or transactions is also recommended.  HOME SAFETY: Consider the safety of the kitchen when operating appliances like stoves, microwave oven, and blender. Consider having supervision and share cooking responsibilities until no longer able to participate in those. Accidents with firearms and other hazards in the house should be identified and addressed as well.   ABILITY TO BE LEFT ALONE: If patient is unable to contact 911 operator, consider using LifeLine, or when the need is there, arrange for someone to stay with patients. Smoking is a fire hazard, consider supervision or cessation. Risk of wandering should be assessed  by caregiver and if detected at any point, supervision and safe proof recommendations should be instituted.  MEDICATION SUPERVISION: Inability to self-administer medication  needs to be constantly addressed. Implement a mechanism to ensure safe administration of the medications.      Mediterranean Diet A Mediterranean diet refers to food and lifestyle choices that are based on the traditions of countries located on the [employer]. This way of eating has been shown to help prevent certain conditions and improve outcomes for people who have chronic diseases, like kidney disease and heart disease. What are tips for following this plan? Lifestyle  Cook and eat meals together with your family, when possible. Drink enough fluid to keep your urine clear or pale yellow. Be physically active every day. This includes: Aerobic exercise like running or swimming. Leisure activities like gardening, walking, or housework. Get 7-8 hours of sleep each night. If recommended by your health care provider, drink red wine in moderation. This means 1 glass a day for nonpregnant women and 2 glasses a day for men. A glass of wine equals 5 oz (150 mL). Reading food labels  Check the serving size of packaged foods. For foods such as rice and pasta, the serving size refers to the amount of cooked product, not dry. Check the total fat in packaged foods. Avoid foods that have saturated fat or trans fats. Check the ingredients list for added sugars, such as corn syrup. Shopping  At the grocery store, buy most of your food from the areas near the walls of the store. This includes: Fresh fruits and vegetables (produce). Grains, beans, nuts, and seeds. Some of these may be available in unpackaged forms or large amounts (in bulk). Fresh seafood. Poultry and eggs. Low-fat dairy products. Buy whole ingredients instead of prepackaged foods. Buy fresh fruits and vegetables in-season from local farmers markets. Buy frozen fruits and vegetables in resealable bags. If you do not have access to quality fresh seafood, buy precooked frozen shrimp or canned fish, such as tuna, salmon, or  sardines. Buy small amounts of raw or cooked vegetables, salads, or olives from the deli or salad bar at your store. Stock your pantry so you always have certain foods on hand, such as olive oil, canned tuna, canned tomatoes, rice, pasta, and beans. Cooking  Cook foods with extra-virgin olive oil instead of using butter or other vegetable oils. Have meat as a side dish, and have vegetables or grains as your main dish. This means having meat in small portions or adding small amounts of meat to foods like pasta or stew. Use beans or vegetables instead of meat in common dishes like chili or lasagna. Experiment with different cooking methods. Try roasting or broiling vegetables instead of steaming or sauteing them. Add frozen vegetables to soups, stews, pasta, or rice. Add nuts or seeds for added healthy fat at each meal. You can add these to yogurt, salads, or vegetable dishes. Marinate fish or vegetables using olive oil, lemon juice, garlic, and fresh herbs. Meal planning  Plan to eat 1 vegetarian meal one day each week. Try to work up to 2 vegetarian meals, if possible. Eat seafood 2 or more times a week. Have healthy snacks readily available, such as: Vegetable sticks with hummus. Greek yogurt. Fruit and nut trail mix. Eat balanced meals throughout the week. This includes: Fruit: 2-3 servings a day Vegetables: 4-5 servings a day Low-fat dairy: 2 servings a day Fish, poultry, or lean meat: 1 serving a day Beans and legumes:  2 or more servings a week Nuts and seeds: 1-2 servings a day Whole grains: 6-8 servings a day Extra-virgin olive oil: 3-4 servings a day Limit red meat and sweets to only a few servings a month What are my food choices? Mediterranean diet Recommended Grains: Whole-grain pasta. Brown rice. Bulgar wheat. Polenta. Couscous. Whole-wheat bread. [place] Made[place]. Vegetables: Artichokes. Beets. Broccoli. Cabbage. Carrots. Eggplant. Green beans. Chard. Kale. Spinach.  Onions. Leeks. Peas. Squash. Tomatoes. Peppers. Radishes. Fruits: Apples. Apricots. Avocado. Berries. Bananas. Cherries. Dates. Figs. Grapes. Lemons. Melon. Oranges. Peaches. Plums. Pomegranate. Meats and other protein foods: Beans. Almonds. Sunflower seeds. Pine nuts. Peanuts. Cod. Salmon. Scallops. Shrimp. Tuna. Tilapia. Clams. Oysters. Eggs. Dairy: Low-fat milk. Cheese. Greek yogurt. Beverages: Water. Red wine. Herbal tea. Fats and oils: Extra virgin olive oil. Avocado oil. Grape seed oil. Sweets and desserts: [place] yogurt with honey. Baked apples. Poached pears. Trail mix. Seasoning and other foods: Basil. Cilantro. Coriander. Cumin. Mint. Parsley. Sage. Rosemary. Tarragon. Garlic. Oregano. Thyme. Pepper. Balsalmic vinegar. Tahini. Hummus. Tomato sauce. Olives. Mushrooms. Limit these Grains: Prepackaged pasta or rice dishes. Prepackaged cereal with added sugar. Vegetables: Deep fried potatoes (french fries). Fruits: Fruit canned in syrup. Meats and other protein foods: Beef. Pork. Lamb. Poultry with skin. Hot dogs. [place]. Dairy: Ice cream. Sour cream. Whole milk. Beverages: Juice. Sugar-sweetened soft drinks. Beer. Liquor and spirits. Fats and oils: Butter. Canola oil. Vegetable oil. Beef fat (tallow). Lard. Sweets and desserts: Cookies. Cakes. Pies. Candy. Seasoning and other foods: Mayonnaise. Premade sauces and marinades. The items listed may not be a complete list. Talk with your dietitian about what dietary choices are right for you. Summary The Mediterranean diet includes both food and lifestyle choices. Eat a variety of fresh fruits and vegetables, beans, nuts, seeds, and whole grains. Limit the amount of red meat and sweets that you eat. Talk with your health care provider about whether it is safe for you to drink red wine in moderation. This means 1 glass a day for nonpregnant women and 2 glasses a day for men. A glass of wine equals 5 oz (150 mL). This information is not  intended to replace advice given to you by your health care provider. Make sure you discuss any questions you have with your health care provider. Document Released: 10/31/2015 Document Revised: 12/03/2015 Document Reviewed: 10/31/2015 Elsevier Interactive Patient Education  2017 [employer].

## 2023-10-25 ENCOUNTER — Encounter: Payer: Self-pay | Admitting: Physician Assistant

## 2023-11-01 ENCOUNTER — Ambulatory Visit

## 2023-11-11 ENCOUNTER — Ambulatory Visit
Admission: RE | Admit: 2023-11-11 | Discharge: 2023-11-11 | Disposition: A | Discharge status: Home / Self Care | Source: Ambulatory Visit | Attending: Physician Assistant | Admitting: Physician Assistant

## 2023-11-11 DIAGNOSIS — R413 Other amnesia: Secondary | ICD-10-CM | POA: Diagnosis not present

## 2023-11-16 ENCOUNTER — Other Ambulatory Visit: Payer: Medicare PPO

## 2023-11-23 ENCOUNTER — Ambulatory Visit: Payer: Self-pay | Admitting: Neurology

## 2023-12-28 ENCOUNTER — Ambulatory Visit (INDEPENDENT_AMBULATORY_CARE_PROVIDER_SITE_OTHER)
Admission: RE | Admit: 2023-12-28 | Discharge: 2023-12-28 | Disposition: A | Discharge status: Home / Self Care | Source: Ambulatory Visit | Attending: Family Medicine | Admitting: Family Medicine

## 2023-12-28 DIAGNOSIS — E2839 Other primary ovarian failure: Secondary | ICD-10-CM | POA: Diagnosis not present

## 2023-12-28 DIAGNOSIS — M8580 Other specified disorders of bone density and structure, unspecified site: Secondary | ICD-10-CM | POA: Diagnosis not present

## 2023-12-30 ENCOUNTER — Other Ambulatory Visit: Payer: Self-pay | Admitting: Family Medicine

## 2023-12-30 ENCOUNTER — Ambulatory Visit: Payer: Self-pay | Admitting: Family Medicine

## 2023-12-30 DIAGNOSIS — M8580 Other specified disorders of bone density and structure, unspecified site: Secondary | ICD-10-CM

## 2023-12-30 NOTE — Progress Notes (Signed)
 Her bone density test shows that she has low bone density and has a higher risk for hip fracture. This requires medication beyond vitamin D  and calcium . I am referring her to the osteoporosis clinic and they should get her scheduled. Does she have any questions?

## 2023-12-31 ENCOUNTER — Other Ambulatory Visit: Payer: Self-pay | Admitting: Nurse Practitioner

## 2024-01-19 ENCOUNTER — Ambulatory Visit: Admitting: Neurology

## 2024-01-24 ENCOUNTER — Encounter: Payer: Self-pay | Admitting: Radiology

## 2024-01-24 ENCOUNTER — Other Ambulatory Visit: Payer: Self-pay | Admitting: Family Medicine

## 2024-01-24 DIAGNOSIS — E039 Hypothyroidism, unspecified: Secondary | ICD-10-CM

## 2024-01-24 DIAGNOSIS — I10 Essential (primary) hypertension: Secondary | ICD-10-CM

## 2024-01-24 DIAGNOSIS — E1165 Type 2 diabetes mellitus with hyperglycemia: Secondary | ICD-10-CM

## 2024-01-24 DIAGNOSIS — E782 Mixed hyperlipidemia: Secondary | ICD-10-CM

## 2024-01-24 DIAGNOSIS — E1122 Type 2 diabetes mellitus with diabetic chronic kidney disease: Secondary | ICD-10-CM

## 2024-01-27 ENCOUNTER — Encounter: Payer: Self-pay | Admitting: Physician Assistant

## 2024-01-27 ENCOUNTER — Ambulatory Visit: Admitting: Physician Assistant

## 2024-01-27 VITALS — Ht 64.0 in | Wt 108.0 lb

## 2024-01-27 DIAGNOSIS — M810 Age-related osteoporosis without current pathological fracture: Secondary | ICD-10-CM | POA: Diagnosis not present

## 2024-01-27 NOTE — Progress Notes (Signed)
 Office Visit Note   Patient: Elizabeth May           Date of Birth: May 15, 1945           MRN: [id number] Visit Date: 01/27/2024              Requested by: Lendia Boby CROME, NP-C [address] [place],  [place] [address] PCP: Lendia Boby CROME, NP-C   Assessment & Plan: Visit Diagnoses:  1. Age-related osteoporosis without current pathological fracture     Plan: Patient is a pleasant 77 year old woman who is referred by Orie Lendia for evaluation of osteoporosis.  She is not currently taking any medication for osteoporosis.  She has no history of a fragility fracture no coronary artery disease or other cardiac issues.  No history of cancer no history of kidney disease but does have a slightly decreased GFR.  Has had some high A1c's working on her diabetes.  She is no has no history of bypass gastric bypass severe reflux no history of epilepsy.  She went through menopause at age 55 did not do hormone replacement therapy.  She currently takes 1000 mg of calcium  supplement a day her calcium  is adequate by recent labs and vitamin D  she takes 5000 international units a day she take consumes 3 beverages a week she is not a smoker she does go to the gym and walks does not do any specific resistance training.  Her most recent bone density scan demonstrated a femur of -2.4 which puts her in the osteopenic range.  Her FRAX score was calculated at a 13.2% chance of overall fracture in the next 10 years and a 4.5% checked is of hip fracture which is slightly elevated.  I spent 45 minutes reviewing her chart speaking with her and her husband.  Her husband is concerned as she is having to have her medications adjusted for her diabetes.  We talked about lifestyle changes including making sure she is getting calcium  and vitamin D  trying to increase her weight just a little bit to get her to get to a goal of 20 or above on BMI she is currently 18.5.  Also talked about adding some resistance training gave him  ideas about this.  If she went forward with something could do Prolia.  Fosamax or Reclast would be less optimal because of her declining kidney function.  Her husband would like to wait and do another bone density with Orie in a couple years and I think that is appropriate.  She still has a little bit higher risk of fracture but there are some [place] style changes hopefully this could be avoided may follow-up as needed  Follow-Up Instructions: No follow-ups on file.   Orders:  No orders of the defined types were placed in this encounter.  No orders of the defined types were placed in this encounter.     Procedures: No procedures performed   Clinical Data: No additional findings.   Subjective: No chief complaint on file.   HPI pleasant 78 year old woman comes in today for evaluation of osteoporosis referred by Orie Lendia  Review of Systems  All other systems reviewed and are negative.    Objective: Vital Signs: Ht 5' 4 (1.626 m)   Wt 108 lb (49 kg)   BMI 18.54 kg/m   Physical Exam Constitutional:      Appearance: Normal appearance.  Pulmonary:     Effort: Pulmonary effort is normal.     Breath sounds: Normal breath  sounds.  Skin:    General: Skin is warm and dry.  Neurological:     General: No focal deficit present.     Mental Status: She is alert and oriented to person, place, and time.  Psychiatric:        Mood and Affect: Mood normal.        Behavior: Behavior normal.     Ortho Exam  Specialty Comments:  No specialty comments available.  Imaging: No results found.   PMFS History: Patient Active Problem List   Diagnosis Date Noted   Age-related osteoporosis without current pathological fracture 01/27/2024   Uncontrolled diabetes mellitus with hyperglycemia, without long-term current use of insulin  (HCC) 09/16/2023   Hypoglycemia 09/16/2023   QT prolongation 09/16/2023   Concern about memory 09/16/2023   Cognitive changes 09/16/2023    Osteopenia 11/04/2020   B12 deficiency 09/16/2020   Arthritis of left hand 05/16/2020   Arthritis of right hand 05/14/2020   RBBB 02/08/2018   Insomnia 10/27/2017   CKD stage 3 due to type 2 diabetes mellitus (HCC) 06/27/2017   BMI 20.0-20.9, adult 06/27/2017   Medication management 03/08/2014   Hyperlipidemia associated with type 2 diabetes mellitus (HCC) 03/21/2013   Vitamin D  deficiency 03/21/2013   Type 2 diabetes mellitus (HCC) 03/17/2013   Hypothyroidism    Cervical radiculopathy at C6 10/22/2011   Essential hypertension 07/01/2009   Past Medical History:  Diagnosis Date   Diabetes mellitus    Headache(784.0)    hx migraines   Hyperlipidemia    Hypertension    Hypothyroidism     Family History  Problem Relation Age of Onset   Hypertension Mother    Hyperlipidemia Mother    Heart disease Mother    Alzheimer's disease Father    Drug abuse Maternal Grandmother    Diabetes Maternal Grandmother    Diabetes Maternal Aunt    Diabetes Maternal Uncle    Alzheimer's disease Paternal Uncle    Alzheimer's disease Paternal Aunt     Past Surgical History:  Procedure Laterality Date   ANTERIOR CERVICAL DECOMP/DISCECTOMY FUSION  10/22/2011   Procedure: ANTERIOR CERVICAL DECOMPRESSION/DISCECTOMY FUSION 1 LEVEL;  Surgeon: Oneil Rodgers Priestly, MD;  Location: [facility] OR;  Service: Orthopedics;  Laterality: Right;  Anterior cervical decompression fusion, cervical 5-6 with instrumentation and allograft.   BREAST EXCISIONAL BIOPSY Left    30 years ago - benign   BREAST SURGERY  80's   lft cyst   Social History   Occupational History   Not on file  Tobacco Use   Smoking status: Never   Smokeless tobacco: Never  Vaping Use   Vaping status: Never Used  Substance and Sexual Activity   Alcohol use: Yes    Alcohol/week: 7.0 standard drinks of alcohol    Types: 7 Glasses of wine per week   Drug use: No   Sexual activity: Yes    Partners: Male    Birth control/protection:  Post-menopausal

## 2024-02-01 ENCOUNTER — Ambulatory Visit: Payer: Self-pay | Admitting: Family Medicine

## 2024-02-01 ENCOUNTER — Ambulatory Visit: Admitting: Family Medicine

## 2024-02-01 VITALS — BP 100/62 | HR 50 | Temp 97.6°F | Ht 64.0 in | Wt 107.0 lb

## 2024-02-01 DIAGNOSIS — E1122 Type 2 diabetes mellitus with diabetic chronic kidney disease: Secondary | ICD-10-CM | POA: Diagnosis not present

## 2024-02-01 DIAGNOSIS — M810 Age-related osteoporosis without current pathological fracture: Secondary | ICD-10-CM | POA: Diagnosis not present

## 2024-02-01 DIAGNOSIS — E538 Deficiency of other specified B group vitamins: Secondary | ICD-10-CM | POA: Diagnosis not present

## 2024-02-01 DIAGNOSIS — I10 Essential (primary) hypertension: Secondary | ICD-10-CM | POA: Diagnosis not present

## 2024-02-01 DIAGNOSIS — E559 Vitamin D deficiency, unspecified: Secondary | ICD-10-CM

## 2024-02-01 DIAGNOSIS — E039 Hypothyroidism, unspecified: Secondary | ICD-10-CM

## 2024-02-01 DIAGNOSIS — E1169 Type 2 diabetes mellitus with other specified complication: Secondary | ICD-10-CM

## 2024-02-01 DIAGNOSIS — Z794 Long term (current) use of insulin: Secondary | ICD-10-CM | POA: Diagnosis not present

## 2024-02-01 DIAGNOSIS — I452 Bifascicular block: Secondary | ICD-10-CM | POA: Insufficient documentation

## 2024-02-01 DIAGNOSIS — E1165 Type 2 diabetes mellitus with hyperglycemia: Secondary | ICD-10-CM

## 2024-02-01 DIAGNOSIS — N183 Chronic kidney disease, stage 3 unspecified: Secondary | ICD-10-CM | POA: Diagnosis not present

## 2024-02-01 DIAGNOSIS — R413 Other amnesia: Secondary | ICD-10-CM

## 2024-02-01 DIAGNOSIS — R001 Bradycardia, unspecified: Secondary | ICD-10-CM | POA: Insufficient documentation

## 2024-02-01 DIAGNOSIS — E785 Hyperlipidemia, unspecified: Secondary | ICD-10-CM | POA: Diagnosis not present

## 2024-02-01 DIAGNOSIS — R9431 Abnormal electrocardiogram [ECG] [EKG]: Secondary | ICD-10-CM

## 2024-02-01 LAB — LIPID PANEL
Cholesterol: 113 mg/dL (ref 0–200)
HDL: 51.2 mg/dL (ref >39.00)
LDL Cholesterol: 48 mg/dL (ref 0–99)
NonHDL: 61.48
Total CHOL/HDL Ratio: 2
Triglycerides: 67 mg/dL (ref 0.0–149.0)
VLDL: 13.4 mg/dL (ref 0.0–40.0)

## 2024-02-01 LAB — COMPREHENSIVE METABOLIC PANEL WITH GFR
ALT: 45 U/L — ABNORMAL HIGH (ref 0–35)
AST: 30 U/L (ref 0–37)
Albumin: 4.1 g/dL (ref 3.5–5.2)
Alkaline Phosphatase: 73 U/L (ref 39–117)
BUN: 24 mg/dL — ABNORMAL HIGH (ref 6–23)
CO2: 29 meq/L (ref 19–32)
Calcium: 9.3 mg/dL (ref 8.4–10.5)
Chloride: 98 meq/L (ref 96–112)
Creatinine, Ser: 1.13 mg/dL (ref 0.40–1.20)
GFR: 47.19 mL/min — ABNORMAL LOW (ref >60.00)
Glucose, Bld: 376 mg/dL — ABNORMAL HIGH (ref 70–99)
Potassium: 4.3 meq/L (ref 3.5–5.1)
Sodium: 136 meq/L (ref 135–145)
Total Bilirubin: 0.5 mg/dL (ref 0.2–1.2)
Total Protein: 6.6 g/dL (ref 6.0–8.3)

## 2024-02-01 LAB — CBC WITH DIFFERENTIAL/PLATELET
Basophils Absolute: 0.1 K/uL (ref 0.0–0.1)
Basophils Relative: 1.1 % (ref 0.0–3.0)
Eosinophils Absolute: 0.1 K/uL (ref 0.0–0.7)
Eosinophils Relative: 2.3 % (ref 0.0–5.0)
HCT: 42.1 % (ref 36.0–46.0)
Hemoglobin: 14.5 g/dL (ref 12.0–15.0)
Lymphocytes Relative: 24.9 % (ref 12.0–46.0)
Lymphs Abs: 1.4 K/uL (ref 0.7–4.0)
MCHC: 34.3 g/dL (ref 30.0–36.0)
MCV: 87.3 fl (ref 78.0–100.0)
Monocytes Absolute: 0.4 K/uL (ref 0.1–1.0)
Monocytes Relative: 6.3 % (ref 3.0–12.0)
Neutro Abs: 3.7 K/uL (ref 1.4–7.7)
Neutrophils Relative %: 65.4 % (ref 43.0–77.0)
Platelets: 274 K/uL (ref 150.0–400.0)
RBC: 4.83 Mil/uL (ref 3.87–5.11)
RDW: 13.1 % (ref 11.5–15.5)
WBC: 5.7 K/uL (ref 4.0–10.5)

## 2024-02-01 LAB — HEMOGLOBIN A1C: Hgb A1c MFr Bld: 10.2 % — ABNORMAL HIGH (ref 4.6–6.5)

## 2024-02-01 LAB — VITAMIN D 25 HYDROXY (VIT D DEFICIENCY, FRACTURES): VITD: 40.58 ng/mL (ref 30.00–100.00)

## 2024-02-01 LAB — VITAMIN B12: Vitamin B-12: 1207 pg/mL — ABNORMAL HIGH (ref 211–911)

## 2024-02-01 LAB — TSH: TSH: 2.62 u[IU]/mL (ref 0.35–5.50)

## 2024-02-01 LAB — MAGNESIUM: Magnesium: 2.1 mg/dL (ref 1.5–2.5)

## 2024-02-01 MED ORDER — LANTUS SOLOSTAR 100 UNIT/ML ~~LOC~~ SOPN: 10.0000 [IU] | PEN_INJECTOR | Freq: Every day | SUBCUTANEOUS | 0 refills | Status: AC

## 2024-02-01 NOTE — Patient Instructions (Addendum)
 I am stopping your amlodipine  to see if your blood pressure and pulse respond well.   Please go downstairs for labs.   I will be in touch with your results and with recommendations.   You will hear from cardiology to schedule.   If you have any dizziness, passing out, fatigue, chest pain, palpitations, or shortness of breath, you should call 911 or go to the emergency department.

## 2024-02-01 NOTE — Progress Notes (Signed)
 Subjective:     Patient ID: Elizabeth May, female    DOB: 01/25/1946, 78 y.o.   MRN: [id number]  Chief Complaint  Patient presents with   Medical Management of Chronic Issues    2 month f/u    HPI  Discussed the use of AI scribe software for clinical note transcription with the patient, who gave verbal consent to proceed.  History of Present Illness Elizabeth May is a 78 year old female with uncontrolled diabetes, hyperlipidemia, and osteoporosis who presents for follow-up on her chronic health conditions. She is accompanied by her husband.  Hyperglycemia and antihyperglycemic medication adherence - Glucose levels remain elevated, typically around 250 mg/dL, especially in the mornings - Occasionally misses doses, particularly when traveling - Medication adherence estimated at 80-90% by her husband  Cognitive impairment and chronic microvascular changes - Memory impairment present - MRI demonstrates chronic microvascular changes - Neurology follow-up scheduled for January  Cardiac conduction abnormalities and bradycardia - History of right bundle branch block and prolonged QT interval - Pulse typically in the low fifties - No chest pain, syncope, palpitations, shortness of breath, or fatigue - Blood pressure is typically well-controlled  Osteoporosis management - Declined pharmacologic osteoporosis treatment from the clinic - Currently taking calcium  and vitamin D  supplementation - Engages in exercise for bone health     Health Maintenance Due  Topic Date Due   FOOT EXAM  05/14/2022   OPHTHALMOLOGY EXAM  05/30/2022   Medicare Annual Wellness (AWV)  08/18/2023    Past Medical History:  Diagnosis Date   Diabetes mellitus    Headache(784.0)    hx migraines   Hyperlipidemia    Hypertension    Hypothyroidism     Past Surgical History:  Procedure Laterality Date   ANTERIOR CERVICAL DECOMP/DISCECTOMY FUSION  10/22/2011   Procedure: ANTERIOR  CERVICAL DECOMPRESSION/DISCECTOMY FUSION 1 LEVEL;  Surgeon: Oneil Rodgers Priestly, MD;  Location: [facility] OR;  Service: Orthopedics;  Laterality: Right;  Anterior cervical decompression fusion, cervical 5-6 with instrumentation and allograft.   BREAST EXCISIONAL BIOPSY Left    30 years ago - benign   BREAST SURGERY  80's   lft cyst    Family History  Problem Relation Age of Onset   Hypertension Mother    Hyperlipidemia Mother    Heart disease Mother    Alzheimer's disease Father    Drug abuse Maternal Grandmother    Diabetes Maternal Grandmother    Diabetes Maternal Aunt    Diabetes Maternal Uncle    Alzheimer's disease Paternal Uncle    Alzheimer's disease Paternal Aunt     Social History   Socioeconomic History   Marital status: Married    Spouse name: Not on file   Number of children: 1   Years of education: Not on file   Highest education level: Bachelor's degree (e.g., BA, AB, BS)  Occupational History   Not on file  Tobacco Use   Smoking status: Never   Smokeless tobacco: Never  Vaping Use   Vaping status: Never Used  Substance and Sexual Activity   Alcohol use: Yes    Alcohol/week: 7.0 standard drinks of alcohol    Types: 7 Glasses of wine per week   Drug use: No   Sexual activity: Yes    Partners: Male    Birth control/protection: Post-menopausal  Other Topics Concern   Not on file  Social History Narrative   Right handed   Lives with husband   Social Drivers of Health  Financial Resource Strain: Low Risk  (02/01/2024)   Overall Financial Resource Strain (CARDIA)    Difficulty of Paying Living Expenses: Not hard at all  Food Insecurity: No Food Insecurity (02/01/2024)   Hunger Vital Sign    Worried About Running Out of Food in the Last Year: Never true    Ran Out of Food in the Last Year: Never true  Transportation Needs: No Transportation Needs (02/01/2024)   PRAPARE - Administrator, Civil Service (Medical): No    Lack of Transportation  (Non-Medical): No  Physical Activity: Sufficiently Active (02/01/2024)   Exercise Vital Sign    Days of Exercise per Week: 4 days    Minutes of Exercise per Session: 60 min  Stress: No Stress Concern Present (02/01/2024)   [employer] of Occupational Health - Occupational Stress Questionnaire    Feeling of Stress: Only a little  Social Connections: Moderately Isolated (02/01/2024)   Social Connection and Isolation Panel    Frequency of Communication with Friends and Family: Three times a week    Frequency of Social Gatherings with Friends and Family: Once a week    Attends Religious Services: Patient declined    Database Administrator or Organizations: No    Attends Engineer, Structural: Not on file    Marital Status: Married  Catering Manager Violence: Not on file    Outpatient Medications Prior to Visit  Medication Sig Dispense Refill   atorvastatin  (LIPITOR) 40 MG tablet TAKE 1 TABLET EVERY DAY FOR CHOLESTEROL 90 tablet 3   glipiZIDE  (GLUCOTROL ) 10 MG tablet TAKE 1/2 TO 1 TABLET TWICE DAILY WITH MEALS. SKIP THIS MEDICATION IF LOW SUGARS, THROWING UP OR NOT EATING. 180 tablet 3   JARDIANCE  25 MG TABS tablet TAKE 1 TABLET EVERY DAY 90 tablet 3   levothyroxine  (SYNTHROID ) 50 MCG tablet TAKE 1 TABLET DAILY ON AN EMPTY STOMACH WITH ONLY WATER FOR 30 MINUTES 90 tablet 3   lisinopril -hydrochlorothiazide  (ZESTORETIC ) 10-12.5 MG tablet TAKE 1 TABLET EVERY DAY 90 tablet 3   metFORMIN  (GLUCOPHAGE -XR) 500 MG 24 hr tablet TAKE 2 TABLETS TWICE DAILY WITH MEALS FOR DIABETES 360 tablet 3   amLODipine  (NORVASC ) 5 MG tablet TAKE 1 TABLET EVERY DAY FOR BLOOD PRESSURE 90 tablet 3   No facility-administered medications prior to visit.    No Known Allergies  Review of Systems  Constitutional:  Negative for chills, fever and malaise/fatigue.  Eyes:  Negative for blurred vision and double vision.  Respiratory:  Negative for shortness of breath.   Cardiovascular:  Negative for chest  pain, palpitations and leg swelling.  Gastrointestinal:  Negative for abdominal pain, constipation, diarrhea, nausea and vomiting.  Genitourinary:  Negative for dysuria, frequency and urgency.  Musculoskeletal:  Negative for falls, joint pain and myalgias.  Neurological:  Negative for dizziness, focal weakness and headaches.  Psychiatric/Behavioral:  Positive for memory loss.        Objective:    Physical Exam Constitutional:      General: She is not in acute distress.    Appearance: She is not ill-appearing.  Eyes:     Extraocular Movements: Extraocular movements intact.     Conjunctiva/sclera: Conjunctivae normal.  Cardiovascular:     Rate and Rhythm: Bradycardia present.  Pulmonary:     Effort: Pulmonary effort is normal.  Musculoskeletal:     Cervical back: Normal range of motion and neck supple.  Skin:    General: Skin is warm and dry.  Neurological:  General: No focal deficit present.     Mental Status: She is alert and oriented to person, place, and time.  Psychiatric:        Mood and Affect: Mood normal.        Behavior: Behavior normal.        Thought Content: Thought content normal.      BP 100/62   Pulse (!) 50   Temp 97.6 F (36.4 C) (Temporal)   Ht 5' 4 (1.626 m)   Wt 107 lb (48.5 kg)   SpO2 98%   BMI 18.37 kg/m  Wt Readings from Last 3 Encounters:  02/01/24 107 lb (48.5 kg)  01/27/24 108 lb (49 kg)  09/16/23 101 lb (45.8 kg)       Assessment & Plan:   Problem List Items Addressed This Visit     Age-related osteoporosis without current pathological fracture   B12 deficiency   Relevant Orders   Vitamin B12 (Completed)   Bradycardia   Relevant Orders   CBC with Differential/Platelet (Completed)   Comprehensive metabolic panel with GFR (Completed)   TSH (Completed)   Magnesium (Completed)   Ambulatory referral to Cardiology   EKG 12-Lead (Completed)   CKD stage 3 due to type 2 diabetes mellitus (HCC)   Relevant Medications   insulin   glargine (LANTUS SOLOSTAR) 100 UNIT/ML Solostar Pen   Other Relevant Orders   Comprehensive metabolic panel with GFR (Completed)   Essential hypertension   Relevant Orders   CBC with Differential/Platelet (Completed)   Comprehensive metabolic panel with GFR (Completed)   EKG 12-Lead (Completed)   Hyperlipidemia associated with type 2 diabetes mellitus (HCC)   Relevant Medications   insulin  glargine (LANTUS SOLOSTAR) 100 UNIT/ML Solostar Pen   Other Relevant Orders   Lipid panel (Completed)   Hypothyroidism   Relevant Orders   TSH (Completed)   Memory impairment   QT prolongation   RBBB (right bundle branch block with left anterior fascicular block)   Relevant Orders   Ambulatory referral to Cardiology   Uncontrolled diabetes mellitus with hyperglycemia, without long-term current use of insulin  (HCC) - Primary   Relevant Medications   insulin  glargine (LANTUS SOLOSTAR) 100 UNIT/ML Solostar Pen   Other Relevant Orders   CBC with Differential/Platelet (Completed)   Comprehensive metabolic panel with GFR (Completed)   Hemoglobin A1c (Completed)   Ambulatory referral to Endocrinology   Vitamin D  deficiency   Relevant Orders   VITAMIN D  25 Hydroxy (Vit-D Deficiency, Fractures) (Completed)    Assessment and Plan Assessment & Plan Type 2 diabetes mellitus, uncontrolled Uncontrolled type 2 diabetes mellitus with recent glucose levels around 250 mg/dL. Current regimen includes oral medications, but adherence is inconsistent, with medication taken 5-6 days a week. She is hesitant to start insulin , but her husband supports it. A continuous glucose monitor (CGM) is recommended for better glucose monitoring. - Ordered A1c test to assess current diabetes control - Referred to endocrinology for diabetes management - Prescribed continuous glucose monitor (CGM) pending A1c results - Instructed to check blood sugars three to four times daily - A1c today is higher at 10.2% (up from 9.1%)  -  Lantus 10 units daily prescribed.  - Continue metformin  and Jardiance .  - Declines appointment with pharmacist.  - follow up with me in office in 4 -6 weeks to ensure blood sugars are improving.   Bradycardia and bifascicular block (right bundle branch block and left anterior fascicular block) with QT prolongation Bradycardia with a heart rate of 49 bpm,  right bundle branch block, left anterior fascicular block, and QT prolongation. No syncope, chest pain, or palpitations reported. Previous EKGs show similar findings. Amlodipine  is discontinued due to bradycardia and low blood pressure. 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 Recent MRI showed chronic changes consistent with microvascular disease.  No acute findings. Follow-up with neurology is planned for January. - reviewed notes and results from neurology visit  - Continue follow up with neurology in January for memory issues and potential medication consideration  Age-related osteoporosis Declined treatment for osteoporosis. Currently managed with calcium , vitamin D , and exercise. - Continue calcium  and vitamin D  supplementation  Essential hypertension Hypertension management complicated by bradycardia. Blood pressure is currently soft, and amlodipine  is discontinued. - Discontinued amlodipine  due to bradycardia and low blood pressure  Vitamin D  deficiency Managed with supplementation. - Continue vitamin D  supplementation   EKG shows sinus bradycardia, rate 49, RBBB with left anterior fascicular block. Old septal infarct. QT prolongation improved since last EKG    Visit time 35 minutes in face to face communication with patient and  coordination of care, additional 10 minutes spent in record review, coordination or care, ordering tests, communicating/referring to other healthcare professionals, documenting in medical records all on the same day of the visit for total time 45 minutes spent on the visit. Time did not include EKG interpretation.     I have discontinued Shaina W. Kawabata CONNIE's amLODipine . I am also having her start on Lantus SoloStar. Additionally, I am having her maintain her levothyroxine , Jardiance , atorvastatin , lisinopril -hydrochlorothiazide , glipiZIDE , and metFORMIN .  Meds ordered this encounter  Medications   insulin  glargine (LANTUS SOLOSTAR) 100 UNIT/ML Solostar Pen    Sig: Inject 10 Units into the skin daily.    Dispense:  15 mL    Refill:  0    Supervising Provider:   ROLLENE NORRIS A [[id number]]

## 2024-02-01 NOTE — Progress Notes (Signed)
 Please order a CGM for her. I need her to check her BS 4 times daily. She is starting insulin  therapy as well. Needs ov to follow up on uncontrolled diabetes in 4-6 weeks. Does not have to fast.

## 2024-02-07 MED ORDER — FREESTYLE LIBRE 3 SENSOR MISC: 5 refills | Status: AC

## 2024-02-22 ENCOUNTER — Encounter: Payer: Self-pay | Admitting: Family Medicine

## 2024-02-22 DIAGNOSIS — E1165 Type 2 diabetes mellitus with hyperglycemia: Secondary | ICD-10-CM

## 2024-02-22 MED ORDER — FREESTYLE LIBRE 3 READER DEVI: 0 refills | Status: AC

## 2024-03-17 ENCOUNTER — Ambulatory Visit: Payer: Self-pay | Admitting: Family Medicine

## 2024-03-17 ENCOUNTER — Ambulatory Visit: Admitting: Family Medicine

## 2024-03-17 ENCOUNTER — Encounter: Payer: Self-pay | Admitting: Family Medicine

## 2024-03-17 VITALS — BP 112/64 | HR 56 | Temp 97.8°F | Ht 64.0 in | Wt 113.0 lb

## 2024-03-17 DIAGNOSIS — R945 Abnormal results of liver function studies: Secondary | ICD-10-CM

## 2024-03-17 DIAGNOSIS — E1165 Type 2 diabetes mellitus with hyperglycemia: Secondary | ICD-10-CM

## 2024-03-17 DIAGNOSIS — N183 Chronic kidney disease, stage 3 unspecified: Secondary | ICD-10-CM

## 2024-03-17 DIAGNOSIS — Z794 Long term (current) use of insulin: Secondary | ICD-10-CM

## 2024-03-17 DIAGNOSIS — E1122 Type 2 diabetes mellitus with diabetic chronic kidney disease: Secondary | ICD-10-CM | POA: Diagnosis not present

## 2024-03-17 DIAGNOSIS — E785 Hyperlipidemia, unspecified: Secondary | ICD-10-CM | POA: Diagnosis not present

## 2024-03-17 DIAGNOSIS — E1169 Type 2 diabetes mellitus with other specified complication: Secondary | ICD-10-CM

## 2024-03-17 LAB — BASIC METABOLIC PANEL WITH GFR
BUN: 17 mg/dL (ref 6–23)
CO2: 28 meq/L (ref 19–32)
Calcium: 8.4 mg/dL (ref 8.4–10.5)
Chloride: 106 meq/L (ref 96–112)
Creatinine, Ser: 0.98 mg/dL (ref 0.40–1.20)
GFR: 55.93 mL/min — ABNORMAL LOW
Glucose, Bld: 108 mg/dL — ABNORMAL HIGH (ref 70–99)
Potassium: 3.9 meq/L (ref 3.5–5.1)
Sodium: 141 meq/L (ref 135–145)

## 2024-03-17 LAB — HEPATIC FUNCTION PANEL
ALT: 13 U/L (ref 3–35)
AST: 17 U/L (ref 5–37)
Albumin: 3.9 g/dL (ref 3.5–5.2)
Alkaline Phosphatase: 56 U/L (ref 39–117)
Bilirubin, Direct: 0.1 mg/dL (ref 0.1–0.3)
Total Bilirubin: 0.4 mg/dL (ref 0.2–1.2)
Total Protein: 6 g/dL (ref 6.0–8.3)

## 2024-03-17 NOTE — Progress Notes (Signed)
 "  Subjective:     Patient ID: Elizabeth May, female    DOB: 11-19-1945, 78 y.o.   MRN: [id number]  Chief Complaint  Patient presents with   Diabetes    BS ranging 170s    Diabetes Pertinent negatives for hypoglycemia include no dizziness or headaches. Pertinent negatives for diabetes include no blurred vision and no chest pain.    Discussed the use of AI scribe software for clinical note transcription with the patient, who gave verbal consent to proceed.  History of Present Illness Elizabeth May is a 78 year old female with uncontrolled diabetes who presents for follow-up. She is accompanied by her caregiver.  Glycemic variability - Uncontrolled diabetes with 17 days of glucose monitoring showing average readings under 180 mg/dL - Nocturnal hypoglycemia to approximately 50 mg/dL lasting a couple of hours - Postprandial hyperglycemia up to 300 mg/dL, especially after lunch and after consuming natural sugars such as tangerines  Bradycardia and blood pressure - Pulse in the mid-50s - Blood pressure historically on the lower side - Discontinued lisinopril  and hydrochlorothiazide  approximately six weeks ago on her own - BP is controlled off of medication  - Awaiting cardiology evaluation for abnormal EKG and persistent bradycardia  Chronic kidney disease and hydration - Chronic kidney disease with focus on hydration and blood glucose control  Hepatic laboratory abnormality - Recent laboratory evaluation showed mild ALT elevation, possibly related to diabetes or other causes - Minimal alcohol consumption  Cardiopulmonary symptoms - No chest pain, palpitations, or shortness of breath     Health Maintenance Due  Topic Date Due   FOOT EXAM  05/14/2022   OPHTHALMOLOGY EXAM  05/30/2022   Medicare Annual Wellness (AWV)  08/18/2023   COVID-19 Vaccine (5 - 2025-26 season) 11/22/2023    Past Medical History:  Diagnosis Date   Diabetes mellitus     Headache(784.0)    hx migraines   Hyperlipidemia    Hypertension    Hypothyroidism     Past Surgical History:  Procedure Laterality Date   ANTERIOR CERVICAL DECOMP/DISCECTOMY FUSION  10/22/2011   Procedure: ANTERIOR CERVICAL DECOMPRESSION/DISCECTOMY FUSION 1 LEVEL;  Surgeon: Oneil Rodgers Priestly, MD;  Location: [facility] OR;  Service: Orthopedics;  Laterality: Right;  Anterior cervical decompression fusion, cervical 5-6 with instrumentation and allograft.   BREAST EXCISIONAL BIOPSY Left    30 years ago - benign   BREAST SURGERY  80's   lft cyst    Family History  Problem Relation Age of Onset   Hypertension Mother    Hyperlipidemia Mother    Heart disease Mother    Alzheimer's disease Father    Drug abuse Maternal Grandmother    Diabetes Maternal Grandmother    Diabetes Maternal Aunt    Diabetes Maternal Uncle    Alzheimer's disease Paternal Uncle    Alzheimer's disease Paternal Aunt     Social History   Socioeconomic History   Marital status: Married    Spouse name: Not on file   Number of children: 1   Years of education: Not on file   Highest education level: Bachelor's degree (e.g., BA, AB, BS)  Occupational History   Not on file  Tobacco Use   Smoking status: Never   Smokeless tobacco: Never  Vaping Use   Vaping status: Never Used  Substance and Sexual Activity   Alcohol use: Yes    Alcohol/week: 7.0 standard drinks of alcohol    Types: 7 Glasses of wine per week   Drug use:  No   Sexual activity: Yes    Partners: Male    Birth control/protection: Post-menopausal  Other Topics Concern   Not on file  Social History Narrative   Right handed   Lives with husband   Social Drivers of Health   Tobacco Use: Low Risk (03/17/2024)   Patient History    Smoking Tobacco Use: Never    Smokeless Tobacco Use: Never    Passive Exposure: Not on file  Financial Resource Strain: Low Risk (02/01/2024)   Overall Financial Resource Strain (CARDIA)    Difficulty of Paying  Living Expenses: Not hard at all  Food Insecurity: No Food Insecurity (02/01/2024)   Epic    Worried About Programme Researcher, Broadcasting/film/video in the Last Year: Never true    Ran Out of Food in the Last Year: Never true  Transportation Needs: No Transportation Needs (02/01/2024)   Epic    Lack of Transportation (Medical): No    Lack of Transportation (Non-Medical): No  Physical Activity: Sufficiently Active (02/01/2024)   Exercise Vital Sign    Days of Exercise per Week: 4 days    Minutes of Exercise per Session: 60 min  Stress: No Stress Concern Present (02/01/2024)   [employer] of Occupational Health - Occupational Stress Questionnaire    Feeling of Stress: Only a little  Social Connections: Moderately Isolated (02/01/2024)   Social Connection and Isolation Panel    Frequency of Communication with Friends and Family: Three times a week    Frequency of Social Gatherings with Friends and Family: Once a week    Attends Religious Services: Patient declined    Active Member of Clubs or Organizations: No    Attends Engineer, Structural: Not on file    Marital Status: Married  Catering Manager Violence: Not on file  Depression (PHQ2-9): Low Risk (02/01/2024)   Depression (PHQ2-9)    PHQ-2 Score: 0  Alcohol Screen: Low Risk (02/01/2024)   Alcohol Screen    Last Alcohol Screening Score (AUDIT): 3  Housing: Low Risk (02/01/2024)   Epic    Unable to Pay for Housing in the Last Year: No    Number of Times Moved in the Last Year: 0    Homeless in the Last Year: No  Utilities: Not on file  Health Literacy: Not on file    Outpatient Medications Prior to Visit  Medication Sig Dispense Refill   atorvastatin  (LIPITOR) 40 MG tablet TAKE 1 TABLET EVERY DAY FOR CHOLESTEROL 90 tablet 3   Continuous Glucose Receiver (FREESTYLE LIBRE 3 READER) DEVI Use to check glucose continuously, at least 4x daily. 1 each 0   Continuous Glucose Sensor (FREESTYLE LIBRE 3 SENSOR) MISC Place 1 sensor on the  skin every 14 days. Use to check glucose continuously, at least 4x daily. 2 each 5   glipiZIDE  (GLUCOTROL ) 10 MG tablet TAKE 1/2 TO 1 TABLET TWICE DAILY WITH MEALS. SKIP THIS MEDICATION IF LOW SUGARS, THROWING UP OR NOT EATING. 180 tablet 3   insulin  glargine (LANTUS  SOLOSTAR) 100 UNIT/ML Solostar Pen Inject 10 Units into the skin daily. 15 mL 0   JARDIANCE  25 MG TABS tablet TAKE 1 TABLET EVERY DAY 90 tablet 3   levothyroxine  (SYNTHROID ) 50 MCG tablet TAKE 1 TABLET DAILY ON AN EMPTY STOMACH WITH ONLY WATER FOR 30 MINUTES 90 tablet 3   lisinopril -hydrochlorothiazide  (ZESTORETIC ) 10-12.5 MG tablet TAKE 1 TABLET EVERY DAY 90 tablet 3   metFORMIN  (GLUCOPHAGE -XR) 500 MG 24 hr tablet TAKE 2 TABLETS TWICE DAILY  WITH MEALS FOR DIABETES 360 tablet 3   No facility-administered medications prior to visit.    Allergies[1]  Review of Systems  Constitutional:  Negative for chills, fever and malaise/fatigue.  Eyes:  Negative for blurred vision and double vision.  Respiratory:  Negative for shortness of breath.   Cardiovascular:  Negative for chest pain, palpitations and leg swelling.  Gastrointestinal:  Negative for abdominal pain, constipation, diarrhea, nausea and vomiting.  Genitourinary:  Negative for dysuria, frequency and urgency.  Neurological:  Negative for dizziness, focal weakness and headaches.       Objective:    Physical Exam Constitutional:      General: She is not in acute distress.    Appearance: She is not ill-appearing.  HENT:     Mouth/Throat:     Mouth: Mucous membranes are moist.     Pharynx: Oropharynx is clear.  Eyes:     Extraocular Movements: Extraocular movements intact.     Conjunctiva/sclera: Conjunctivae normal.  Cardiovascular:     Rate and Rhythm: Bradycardia present.  Pulmonary:     Effort: Pulmonary effort is normal.  Musculoskeletal:        General: Normal range of motion.     Cervical back: Normal range of motion and neck supple.     Right lower leg: No  edema.     Left lower leg: No edema.  Skin:    General: Skin is warm and dry.  Neurological:     General: No focal deficit present.     Mental Status: She is alert and oriented to person, place, and time.     Motor: No weakness.     Coordination: Coordination normal.     Gait: Gait normal.  Psychiatric:        Mood and Affect: Mood normal.        Behavior: Behavior normal.        Thought Content: Thought content normal.      BP 112/64   Pulse (!) 56   Temp 97.8 F (36.6 C) (Temporal)   Ht 5' 4 (1.626 m)   Wt 113 lb (51.3 kg)   SpO2 98%   BMI 19.40 kg/m  Wt Readings from Last 3 Encounters:  03/17/24 113 lb (51.3 kg)  02/01/24 107 lb (48.5 kg)  01/27/24 108 lb (49 kg)       Assessment & Plan:   Problem List Items Addressed This Visit     CKD stage 3 due to type 2 diabetes mellitus (HCC)   Relevant Orders   Basic metabolic panel with GFR   Hyperlipidemia associated with type 2 diabetes mellitus (HCC)   Uncontrolled diabetes mellitus with hyperglycemia, without long-term current use of insulin  (HCC)   Relevant Orders   Basic metabolic panel with GFR   Hepatic function panel   Other Visit Diagnoses       Abnormal liver function    -  Primary   Relevant Orders   Basic metabolic panel with GFR   Hepatic function panel       Assessment and Plan Assessment & Plan Type 2 diabetes mellitus with hyperglycemia Uncontrolled diabetes with blood glucose levels averaging under 180 mg/dL, with episodes of hypoglycemia (as low as 50 mg/dL at night) and hyperglycemia (up to 300 mg/dL postprandially). Recent dietary changes include increased natural sugars, contributing to glucose spikes. Insulin  therapy was initiated at the last visit. Endocrinology appointment scheduled for February. - Continue basal insulin  therapy 10 units daily, metformin , Jardiance  and glipizide  (with  meals only) - Encouraged consumption of low glycemic index fruits and vegetables. - Advised consuming  a small amount of protein before bedtime to prevent nocturnal hypoglycemia. - Follow up with endocrinology in February.  Hyperlipidemia associated with type 2 diabetes mellitus Cholesterol levels are well-controlled.  Abnormal liver function Mild elevation in ALT noted previously, possibly related to diabetes or other factors. Minimal alcohol consumption reported. - Ordered liver function tests to reassess ALT levels. - If liver function tests remain abnormal, will order liver ultrasound.  Chronic kidney disease Well-managed with no acute issues. - Continue to maintain hydration and manage blood sugar levels.     I am having Elizabeth May maintain her levothyroxine , Jardiance , atorvastatin , lisinopril -hydrochlorothiazide , glipiZIDE , metFORMIN , Lantus  SoloStar, FreeStyle [employer], and [employer] 3 Reader.  No orders of the defined types were placed in this encounter.      [1] No Known Allergies  "

## 2024-03-17 NOTE — Patient Instructions (Addendum)
" °  Please call [phone] to make an appointment with [facility] (cardiology)   Please go downstairs for labs before you leave.  I will be in touch with your results "

## 2024-04-10 NOTE — Progress Notes (Signed)
 "   Memory impairment of unclear etiology, concern for Alzheimer's disease with a vascular component   Elizabeth May is a very pleasant 79 y.o. RH female with a history of hyperlipidemia, hypothyroidism, DM2, B12 and vitamin D  deficiency, CKD, QT prolongation seen today in follow up for memory loss. Patient is not currently on continue dementia medication***.   Patient was last seen on 10/15/2023***. Memory is ***. MMSE today is  /30.  Of note, the patient provides poor effort on testing, therefore no neuropsych evaluation was able to be performed to date.  Patient is able to participate on ADLs and continues to drive without difficulties. Mood is*** . This patient is accompanied in the office by her husband*** who supplements the history.  Previous records as well as any outside records available were reviewed prior to todays visit.  Follow up in  months Start memantine  5 mg twice daily, side effects discussed Replenish B12 Recommend good control of cardiovascular risk factors.   Continue to control mood as per PCP   Discussed the use of AI scribe software for clinical note transcription with the patient, who gave verbal consent to proceed.  History of Present Illness   Initial visit 10/15/2023      How long did patient have memory difficulties? Not at all-she says. Husband disagrees, he reports that a few people noticed that occasionally repeats herself, to which she adamantly denies.  He reports some difficulty remembering new information, recent conversations. She is able to remember names.   Disoriented when walking into a room? Denies    Leaving objects in unusual places?  Denies.   Wandering behavior? Denies.   Any personality changes, or depression, anxiety? Denies   Hallucinations or paranoia? Denies.   Seizures? Denies.    Any sleep changes?  Sleeps well  most of the time. She goes to sleep late. Denies frequent nightmares or dream reenactment, other REM behavior or  sleepwalking   Sleep apnea? Denies.   Any hygiene concerns?  Denies.   Independent of bathing and dressing? Endorsed  Does the patient need help with medications?  Patient is in charge   Who is in charge of the finances? Husband is in charge     Any changes in appetite?  I am watching carbs more.     Patient have trouble swallowing?  Denies.   Does the patient cook?   yes, denies forgetting common recipes, but husband reports that she has missed an ingredient or 2, denies kitchen accidents.   Any headaches?  Once in a while, but it is not an issue Chronic pain? Denies.   Ambulates with difficulty? Denies. Goes to the gym uses the treadmill, walks frequently Recent falls or head injuries? Denies.     Vision changes?  Denies any new issues.   Any strokelike symptoms? Denies.   Any tremors? Denies.   Any anosmia? Denies.   Any incontinence of urine? Urge incontinence Any bowel dysfunction? Denies.      Patient lives with husband    History of heavy alcohol intake? Denies.   History of heavy tobacco use? Denies.   Family history of dementia?   Father dementia but he was really old-she says Does patient drive?  yes, denies getting lost.     Recent labs TSH 2.89, vitamin D  47, vitamin B12 341, normal lipid panel, A1c 9.1, normal CBC.  MRI of the brain 11/11/2023 personally reviewed without acute intracranial abnormalities, chronic small vessel disease   Retired chief executive officer school  teacher            No data to display            10/19/2023    7:00 AM  Montreal Cognitive Assessment   Visuospatial/ Executive (0/5) 4  Naming (0/3) 3  Attention: Read list of digits (0/2) 2  Attention: Read list of letters (0/1) 1  Attention: Serial 7 subtraction starting at 100 (0/3) 1  Language: Repeat phrase (0/2) 2  Language : Fluency (0/1) 0  Abstraction (0/2) 1  Delayed Recall (0/5) 0  Orientation (0/6) 2  Total 16  Adjusted Score (based on education) 16      Objective:     Neurological Exam:    VITALS:   Vitals:   04/11/24 1446  BP: (!) 196/69  Pulse: (!) 49  Resp: 20  SpO2: 98%  Weight: 113 lb (51.3 kg)  Height: 5' 4 (1.626 m)    GEN:  The patient appears stated age and is in NAD. HEENT:  Normocephalic, atraumatic.   Neurological examination:  General: NAD, well-groomed, appears stated age. Orientation: The patient is alert. Oriented to person, place and not to date*** Cranial nerves: There is good facial symmetry.The speech is fluent and clear. No aphasia or dysarthria. Fund of knowledge is appropriate. Recent and remote memory are impaired. Attention and concentration are reduced. Able to name objects and unable to repeat phrases.  Hearing is intact to conversational tone. *** Sensation: Sensation is intact to light touch throughout Motor: Strength is at least antigravity x4. DTR's 2/4 in UE/LE     Movement examination:  Tone: There is normal tone in the UE/LE Abnormal movements:  no tremor.  No myoclonus.  No asterixis.   Coordination:  There is no decremation with RAM's. Normal finger to nose  Gait and Station: The patient has no*** difficulty arising out of a deep-seated chair without the use of the hands. The patient's stride length is good.  Gait is cautious and narrow.    Thank you for allowing us  the opportunity to participate in the care of this nice patient. Please do not hesitate to contact us  for any questions or concerns.   Total time spent on today's visit was *** minutes dedicated to this patient today, preparing to see patient, examining the patient, ordering tests and/or medications and counseling the patient, documenting clinical information in the EHR or other health record, independently interpreting results and communicating results to the patient/family, discussing treatment and goals, answering patient's questions and coordinating care.  Cc:  Lendia Boby CROME, NP-C  Camie Sevin 04/11/2024 3:21 PM      "

## 2024-04-11 ENCOUNTER — Ambulatory Visit: Admitting: Physician Assistant

## 2024-04-11 ENCOUNTER — Encounter: Payer: Self-pay | Admitting: Physician Assistant

## 2024-04-11 VITALS — BP 196/69 | HR 49 | Resp 20 | Ht 64.0 in | Wt 113.0 lb

## 2024-04-11 DIAGNOSIS — R413 Other amnesia: Secondary | ICD-10-CM | POA: Diagnosis not present

## 2024-04-11 MED ORDER — MEMANTINE HCL 5 MG PO TABS: ORAL_TABLET | ORAL | 3 refills | Status: AC

## 2024-04-11 NOTE — Patient Instructions (Signed)
 It was a pleasure to see you today at our office.   Recommendations:    Start Memantine  5mg  tablets.  Take 1 tablet at bedtime for 2 weeks, then 1 tablet twice daily.    Follow up in 6  months  [email or web address]   RECOMMENDATIONS FOR ALL PATIENTS WITH MEMORY PROBLEMS: 1. Continue to exercise (Recommend 30 minutes of walking everyday, or 3 hours every week) 2. Increase social interactions - continue going to [place] and enjoy social gatherings with friends and family 3. Eat healthy, avoid fried foods and eat more fruits and vegetables 4. Maintain adequate blood pressure, blood sugar, and blood cholesterol level. Reducing the risk of stroke and cardiovascular disease also helps promoting better memory. 5. Avoid stressful situations. Live a simple life and avoid aggravations. Organize your time and prepare for the next day in anticipation. 6. Sleep well, avoid any interruptions of sleep and avoid any distractions in the bedroom that may interfere with adequate sleep quality 7. Avoid sugar, avoid sweets as there is a strong link between excessive sugar intake, diabetes, and cognitive impairment We discussed the Mediterranean diet, which has been shown to help patients reduce the risk of progressive memory disorders and reduces cardiovascular risk. This includes eating fish, eat fruits and green leafy vegetables, nuts like almonds and hazelnuts, walnuts, and also use olive oil. Avoid fast foods and fried foods as much as possible. Avoid sweets and sugar as sugar use has been linked to worsening of memory function.  There is always a concern of gradual progression of memory problems. If this is the case, then we may need to adjust level of care according to patient needs. Support, both to the patient and caregiver, should then be put into place.        DRIVING: Regarding driving, in patients with progressive memory problems, driving will be  impaired. We advise to have someone else do the driving if trouble finding directions or if minor accidents are reported. Independent driving assessment is available to determine safety of driving.   If you are interested in the driving assessment, you can contact the following:  [employer] in [place] [phone]  [facility] [phone]  [facility] [phone]  [facility] [phone] or [phone]   FALL PRECAUTIONS: Be cautious when walking. Scan the area for obstacles that may increase the risk of trips and falls. When getting up in the mornings, sit up at the edge of the bed for a few minutes before getting out of bed. Consider elevating the bed at the head end to avoid drop of blood pressure when getting up. Walk always in a well-lit room (use night lights in the walls). Avoid area rugs or power cords from appliances in the middle of the walkways. Use a walker or a cane if necessary and consider physical therapy for balance exercise. Get your eyesight checked regularly.  FINANCIAL OVERSIGHT: Supervision, especially oversight when making financial decisions or transactions is also recommended.  HOME SAFETY: Consider the safety of the kitchen when operating appliances like stoves, microwave oven, and blender. Consider having supervision and share cooking responsibilities until no longer able to participate in those. Accidents with firearms and other hazards in the house should be identified and addressed as well.   ABILITY TO BE LEFT ALONE: If patient is unable to contact 911 operator, consider using [facility], or when the need is there, arrange for someone to stay with patients. Smoking is a fire hazard, consider supervision or cessation. Risk  of wandering should be assessed by caregiver and if detected at any point, supervision and safe proof recommendations should be instituted.  MEDICATION SUPERVISION: Inability to  self-administer medication needs to be constantly addressed. Implement a mechanism to ensure safe administration of the medications.      Mediterranean Diet A Mediterranean diet refers to food and lifestyle choices that are based on the traditions of countries located on the [employer]. This way of eating has been shown to help prevent certain conditions and improve outcomes for people who have chronic diseases, like kidney disease and heart disease. What are tips for following this plan? Lifestyle  Cook and eat meals together with your family, when possible. Drink enough fluid to keep your urine clear or pale yellow. Be physically active every day. This includes: Aerobic exercise like running or swimming. Leisure activities like gardening, walking, or housework. Get 7-8 hours of sleep each night. If recommended by your health care provider, drink red wine in moderation. This means 1 glass a day for nonpregnant women and 2 glasses a day for men. A glass of wine equals 5 oz (150 mL). Reading food labels  Check the serving size of packaged foods. For foods such as rice and pasta, the serving size refers to the amount of cooked product, not dry. Check the total fat in packaged foods. Avoid foods that have saturated fat or trans fats. Check the ingredients list for added sugars, such as corn syrup. Shopping  At the grocery store, buy most of your food from the areas near the walls of the store. This includes: Fresh fruits and vegetables (produce). Grains, beans, nuts, and seeds. Some of these may be available in unpackaged forms or large amounts (in bulk). Fresh seafood. Poultry and eggs. Low-fat dairy products. Buy whole ingredients instead of prepackaged foods. Buy fresh fruits and vegetables in-season from local farmers markets. Buy frozen fruits and vegetables in resealable bags. If you do not have access to quality fresh seafood, buy precooked frozen shrimp or canned fish, such  as tuna, salmon, or sardines. Buy small amounts of raw or cooked vegetables, salads, or olives from the deli or salad bar at your store. Stock your pantry so you always have certain foods on hand, such as olive oil, canned tuna, canned tomatoes, rice, pasta, and beans. Cooking  Cook foods with extra-virgin olive oil instead of using butter or other vegetable oils. Have meat as a side dish, and have vegetables or grains as your main dish. This means having meat in small portions or adding small amounts of meat to foods like pasta or stew. Use beans or vegetables instead of meat in common dishes like chili or lasagna. Experiment with different cooking methods. Try roasting or broiling vegetables instead of steaming or sauteing them. Add frozen vegetables to soups, stews, pasta, or rice. Add nuts or seeds for added healthy fat at each meal. You can add these to yogurt, salads, or vegetable dishes. Marinate fish or vegetables using olive oil, lemon juice, garlic, and fresh herbs. Meal planning  Plan to eat 1 vegetarian meal one day each week. Try to work up to 2 vegetarian meals, if possible. Eat seafood 2 or more times a week. Have healthy snacks readily available, such as: Vegetable sticks with hummus. Greek yogurt. Fruit and nut trail mix. Eat balanced meals throughout the week. This includes: Fruit: 2-3 servings a day Vegetables: 4-5 servings a day Low-fat dairy: 2 servings a day Fish, poultry, or lean meat: 1 serving  a day Beans and legumes: 2 or more servings a week Nuts and seeds: 1-2 servings a day Whole grains: 6-8 servings a day Extra-virgin olive oil: 3-4 servings a day Limit red meat and sweets to only a few servings a month What are my food choices? Mediterranean diet Recommended Grains: Whole-grain pasta. Brown rice. Bulgar wheat. Polenta. Couscous. Whole-wheat bread. [place] Made[place]. Vegetables: Artichokes. Beets. Broccoli. Cabbage. Carrots. Eggplant. Green beans. Chard.  Kale. Spinach. Onions. Leeks. Peas. Squash. Tomatoes. Peppers. Radishes. Fruits: Apples. Apricots. Avocado. Berries. Bananas. Cherries. Dates. Figs. Grapes. Lemons. Melon. Oranges. Peaches. Plums. Pomegranate. Meats and other protein foods: Beans. Almonds. Sunflower seeds. Pine nuts. Peanuts. Cod. Salmon. Scallops. Shrimp. Tuna. Tilapia. Clams. Oysters. Eggs. Dairy: Low-fat milk. Cheese. Greek yogurt. Beverages: Water. Red wine. Herbal tea. Fats and oils: Extra virgin olive oil. Avocado oil. Grape seed oil. Sweets and desserts: Greek yogurt with honey. Baked apples. Poached pears. Trail mix. Seasoning and other foods: Basil. Cilantro. Coriander. Cumin. Mint. Parsley. Sage. Rosemary. Tarragon. Garlic. Oregano. Thyme. Pepper. Balsalmic vinegar. Tahini. Hummus. Tomato sauce. Olives. Mushrooms. Limit these Grains: Prepackaged pasta or rice dishes. Prepackaged cereal with added sugar. Vegetables: Deep fried potatoes (french fries). Fruits: Fruit canned in syrup. Meats and other protein foods: Beef. Pork. Lamb. Poultry with skin. Hot dogs. [place]. Dairy: Ice cream. Sour cream. Whole milk. Beverages: Juice. Sugar-sweetened soft drinks. Beer. Liquor and spirits. Fats and oils: Butter. Canola oil. Vegetable oil. Beef fat (tallow). Lard. Sweets and desserts: Cookies. Cakes. Pies. Candy. Seasoning and other foods: Mayonnaise. Premade sauces and marinades. The items listed may not be a complete list. Talk with your dietitian about what dietary choices are right for you. Summary The Mediterranean diet includes both food and lifestyle choices. Eat a variety of fresh fruits and vegetables, beans, nuts, seeds, and whole grains. Limit the amount of red meat and sweets that you eat. Talk with your health care provider about whether it is safe for you to drink red wine in moderation. This means 1 glass a day for nonpregnant women and 2 glasses a day for men. A glass of wine equals 5 oz (150 mL). This information  is not intended to replace advice given to you by your health care provider. Make sure you discuss any questions you have with your health care provider. Document Released: 10/31/2015 Document Revised: 12/03/2015 Document Reviewed: 10/31/2015 Elsevier Interactive Patient Education  2017 [employer].

## 2024-04-17 ENCOUNTER — Ambulatory Visit: Admitting: Physician Assistant

## 2024-05-08 ENCOUNTER — Ambulatory Visit: Admitting: Cardiology

## 2024-05-09 ENCOUNTER — Ambulatory Visit: Admitting: Endocrinology
